# Patient Record
Sex: Female | Born: 1945
Health system: Southern US, Community
[De-identification: ages and names within clinical notes are randomized; demographics above are authoritative.]

## PROBLEM LIST (undated history)

## (undated) DIAGNOSIS — L409 Psoriasis, unspecified: Secondary | ICD-10-CM

## (undated) DIAGNOSIS — M8580 Other specified disorders of bone density and structure, unspecified site: Secondary | ICD-10-CM

## (undated) DIAGNOSIS — E041 Nontoxic single thyroid nodule: Secondary | ICD-10-CM

## (undated) DIAGNOSIS — I10 Essential (primary) hypertension: Secondary | ICD-10-CM

## (undated) DIAGNOSIS — B029 Zoster without complications: Secondary | ICD-10-CM

## (undated) DIAGNOSIS — D3617 Benign neoplasm of peripheral nerves and autonomic nervous system of trunk, unspecified: Secondary | ICD-10-CM

## (undated) DIAGNOSIS — H269 Unspecified cataract: Secondary | ICD-10-CM

## (undated) DIAGNOSIS — M810 Age-related osteoporosis without current pathological fracture: Secondary | ICD-10-CM

## (undated) DIAGNOSIS — F419 Anxiety disorder, unspecified: Secondary | ICD-10-CM

## (undated) DIAGNOSIS — R413 Other amnesia: Secondary | ICD-10-CM

## (undated) DIAGNOSIS — C801 Malignant (primary) neoplasm, unspecified: Secondary | ICD-10-CM

## (undated) HISTORY — PX: ABDOMINAL HYSTERECTOMY: SHX81

## (undated) HISTORY — DX: Essential (primary) hypertension: I10

## (undated) HISTORY — DX: Psoriasis, unspecified: L40.9

## (undated) HISTORY — PX: OOPHORECTOMY: SHX86

## (undated) HISTORY — DX: Benign neoplasm of peripheral nerves and autonomic nervous system of trunk, unspecified: D36.17

## (undated) HISTORY — DX: Zoster without complications: B02.9

## (undated) HISTORY — DX: Age-related osteoporosis without current pathological fracture: M81.0

## (undated) HISTORY — DX: Nontoxic single thyroid nodule: E04.1

## (undated) HISTORY — DX: Unspecified cataract: H26.9

## (undated) HISTORY — PX: TONSILLECTOMY: SHX5217

## (undated) HISTORY — DX: Anxiety disorder, unspecified: F41.9

## (undated) HISTORY — DX: Malignant (primary) neoplasm, unspecified: C80.1

## (undated) HISTORY — PX: COLONOSCOPY: SHX174

## (undated) HISTORY — DX: Other amnesia: R41.3

## (undated) HISTORY — PX: OTHER SURGICAL HISTORY: SHX169

## (undated) HISTORY — PX: APPENDECTOMY: SHX54

## (undated) HISTORY — DX: Other specified disorders of bone density and structure, unspecified site: M85.80

---

## 1999-12-06 ENCOUNTER — Other Ambulatory Visit: Admission: RE | Admit: 1999-12-06 | Discharge: 1999-12-06 | Payer: Self-pay | Admitting: *Deleted

## 2000-04-17 ENCOUNTER — Ambulatory Visit (HOSPITAL_COMMUNITY): Admission: RE | Admit: 2000-04-17 | Discharge: 2000-04-17 | Payer: Self-pay | Admitting: *Deleted

## 2003-06-03 ENCOUNTER — Encounter: Admission: RE | Admit: 2003-06-03 | Discharge: 2003-06-03 | Payer: Self-pay | Admitting: Internal Medicine

## 2004-05-23 ENCOUNTER — Ambulatory Visit: Payer: Self-pay | Admitting: Internal Medicine

## 2004-06-06 ENCOUNTER — Ambulatory Visit: Payer: Self-pay | Admitting: Internal Medicine

## 2005-04-18 ENCOUNTER — Ambulatory Visit: Payer: Self-pay | Admitting: Internal Medicine

## 2005-04-30 ENCOUNTER — Encounter: Admission: RE | Admit: 2005-04-30 | Discharge: 2005-04-30 | Payer: Self-pay | Admitting: Internal Medicine

## 2005-12-17 ENCOUNTER — Ambulatory Visit: Payer: Self-pay | Admitting: Internal Medicine

## 2006-02-11 DIAGNOSIS — B029 Zoster without complications: Secondary | ICD-10-CM

## 2006-02-11 HISTORY — DX: Zoster without complications: B02.9

## 2006-02-14 ENCOUNTER — Ambulatory Visit: Payer: Self-pay | Admitting: Internal Medicine

## 2006-03-19 ENCOUNTER — Ambulatory Visit: Payer: Self-pay | Admitting: Internal Medicine

## 2006-03-19 LAB — CONVERTED CEMR LAB
ALT: 14 units/L (ref 0–40)
AST: 22 units/L (ref 0–37)
BUN: 17 mg/dL (ref 6–23)
Basophils Absolute: 0 10*3/uL (ref 0.0–0.1)
Basophils Relative: 0.9 % (ref 0.0–1.0)
Creatinine, Ser: 1.1 mg/dL (ref 0.4–1.2)
Eosinophils Absolute: 0.1 10*3/uL (ref 0.0–0.6)
Eosinophils Relative: 1.8 % (ref 0.0–5.0)
Free T4: 0.6 ng/dL (ref 0.6–1.6)
HCT: 36.5 % (ref 36.0–46.0)
Hemoglobin: 12.6 g/dL (ref 12.0–15.0)
Lymphocytes Relative: 35.5 % (ref 12.0–46.0)
MCHC: 34.6 g/dL (ref 30.0–36.0)
MCV: 95.4 fL (ref 78.0–100.0)
Monocytes Absolute: 0.3 10*3/uL (ref 0.2–0.7)
Monocytes Relative: 8.4 % (ref 3.0–11.0)
Neutro Abs: 1.9 10*3/uL (ref 1.4–7.7)
Neutrophils Relative %: 53.4 % (ref 43.0–77.0)
Platelets: 233 10*3/uL (ref 150–400)
Potassium: 3.7 meq/L (ref 3.5–5.1)
RBC: 3.82 M/uL — ABNORMAL LOW (ref 3.87–5.11)
RDW: 11.6 % (ref 11.5–14.6)
TSH: 6.39 microintl units/mL — ABNORMAL HIGH (ref 0.35–5.50)
WBC: 3.6 10*3/uL — ABNORMAL LOW (ref 4.5–10.5)

## 2006-04-21 ENCOUNTER — Ambulatory Visit: Payer: Self-pay | Admitting: Internal Medicine

## 2006-04-21 LAB — CONVERTED CEMR LAB
ALT: 24 units/L (ref 0–40)
AST: 27 units/L (ref 0–37)
BUN: 15 mg/dL (ref 6–23)
Basophils Absolute: 0 10*3/uL (ref 0.0–0.1)
Basophils Relative: 0.2 % (ref 0.0–1.0)
Creatinine, Ser: 0.9 mg/dL (ref 0.4–1.2)
Eosinophils Absolute: 0 10*3/uL (ref 0.0–0.6)
Eosinophils Relative: 0.7 % (ref 0.0–5.0)
Free T4: 0.7 ng/dL (ref 0.6–1.6)
HCT: 36.8 % (ref 36.0–46.0)
Hemoglobin: 12.7 g/dL (ref 12.0–15.0)
Lymphocytes Relative: 27.3 % (ref 12.0–46.0)
MCHC: 34.6 g/dL (ref 30.0–36.0)
MCV: 96.1 fL (ref 78.0–100.0)
Monocytes Absolute: 0.3 10*3/uL (ref 0.2–0.7)
Monocytes Relative: 7.1 % (ref 3.0–11.0)
Neutro Abs: 3.1 10*3/uL (ref 1.4–7.7)
Neutrophils Relative %: 64.7 % (ref 43.0–77.0)
Platelets: 215 10*3/uL (ref 150–400)
Potassium: 4.1 meq/L (ref 3.5–5.1)
RBC: 3.83 M/uL — ABNORMAL LOW (ref 3.87–5.11)
RDW: 11.6 % (ref 11.5–14.6)
TSH: 3.57 microintl units/mL (ref 0.35–5.50)
WBC: 4.7 10*3/uL (ref 4.5–10.5)

## 2006-04-28 ENCOUNTER — Ambulatory Visit: Payer: Self-pay | Admitting: Internal Medicine

## 2006-05-06 ENCOUNTER — Ambulatory Visit: Payer: Self-pay | Admitting: Internal Medicine

## 2006-07-21 ENCOUNTER — Encounter: Payer: Self-pay | Admitting: Internal Medicine

## 2006-07-25 ENCOUNTER — Encounter: Payer: Self-pay | Admitting: Internal Medicine

## 2006-10-27 ENCOUNTER — Ambulatory Visit: Payer: Self-pay | Admitting: Internal Medicine

## 2006-10-28 ENCOUNTER — Encounter (INDEPENDENT_AMBULATORY_CARE_PROVIDER_SITE_OTHER): Payer: Self-pay | Admitting: *Deleted

## 2006-10-28 LAB — CONVERTED CEMR LAB: TSH: 7.91 microintl units/mL — ABNORMAL HIGH (ref 0.35–5.50)

## 2006-11-04 ENCOUNTER — Ambulatory Visit: Payer: Self-pay | Admitting: Internal Medicine

## 2006-12-29 ENCOUNTER — Ambulatory Visit: Payer: Self-pay | Admitting: Internal Medicine

## 2007-01-03 LAB — CONVERTED CEMR LAB
CO2: 30 meq/L (ref 19–32)
Chloride: 102 meq/L (ref 96–112)
Cholesterol: 214 mg/dL (ref 0–200)
Creatinine, Ser: 1 mg/dL (ref 0.4–1.2)
Glucose, Bld: 100 mg/dL — ABNORMAL HIGH (ref 70–99)
Sodium: 140 meq/L (ref 135–145)

## 2007-01-05 ENCOUNTER — Encounter (INDEPENDENT_AMBULATORY_CARE_PROVIDER_SITE_OTHER): Payer: Self-pay | Admitting: *Deleted

## 2007-01-13 ENCOUNTER — Ambulatory Visit: Payer: Self-pay | Admitting: Internal Medicine

## 2007-01-13 DIAGNOSIS — Z9089 Acquired absence of other organs: Secondary | ICD-10-CM

## 2007-01-13 DIAGNOSIS — E782 Mixed hyperlipidemia: Secondary | ICD-10-CM

## 2007-01-13 DIAGNOSIS — L408 Other psoriasis: Secondary | ICD-10-CM | POA: Insufficient documentation

## 2007-01-13 DIAGNOSIS — E039 Hypothyroidism, unspecified: Secondary | ICD-10-CM | POA: Insufficient documentation

## 2007-01-13 DIAGNOSIS — E041 Nontoxic single thyroid nodule: Secondary | ICD-10-CM | POA: Insufficient documentation

## 2007-01-13 LAB — CONVERTED CEMR LAB
HDL goal, serum: 40 mg/dL
LDL Goal: 160 mg/dL

## 2007-02-03 ENCOUNTER — Ambulatory Visit: Payer: Self-pay | Admitting: Internal Medicine

## 2007-05-18 ENCOUNTER — Encounter: Admission: RE | Admit: 2007-05-18 | Discharge: 2007-05-18 | Payer: Self-pay | Admitting: Internal Medicine

## 2007-05-25 ENCOUNTER — Encounter (INDEPENDENT_AMBULATORY_CARE_PROVIDER_SITE_OTHER): Payer: Self-pay | Admitting: *Deleted

## 2007-05-25 ENCOUNTER — Telehealth (INDEPENDENT_AMBULATORY_CARE_PROVIDER_SITE_OTHER): Payer: Self-pay | Admitting: *Deleted

## 2007-06-04 ENCOUNTER — Telehealth (INDEPENDENT_AMBULATORY_CARE_PROVIDER_SITE_OTHER): Payer: Self-pay | Admitting: *Deleted

## 2007-06-04 ENCOUNTER — Ambulatory Visit: Payer: Self-pay | Admitting: Family Medicine

## 2007-06-05 ENCOUNTER — Encounter (INDEPENDENT_AMBULATORY_CARE_PROVIDER_SITE_OTHER): Payer: Self-pay | Admitting: *Deleted

## 2007-06-05 LAB — CONVERTED CEMR LAB
Basophils Relative: 0.7 % (ref 0.0–1.0)
Hemoglobin: 14 g/dL (ref 12.0–15.0)
Lymphocytes Relative: 16.4 % (ref 12.0–46.0)
Monocytes Relative: 5.5 % (ref 3.0–12.0)
Neutro Abs: 6.6 10*3/uL (ref 1.4–7.7)
RBC: 4.34 M/uL (ref 3.87–5.11)
RDW: 10.9 % — ABNORMAL LOW (ref 11.5–14.6)

## 2007-06-06 ENCOUNTER — Ambulatory Visit: Payer: Self-pay | Admitting: Family Medicine

## 2007-06-26 ENCOUNTER — Telehealth (INDEPENDENT_AMBULATORY_CARE_PROVIDER_SITE_OTHER): Payer: Self-pay | Admitting: *Deleted

## 2007-07-29 ENCOUNTER — Encounter: Payer: Self-pay | Admitting: Internal Medicine

## 2007-10-29 ENCOUNTER — Telehealth (INDEPENDENT_AMBULATORY_CARE_PROVIDER_SITE_OTHER): Payer: Self-pay | Admitting: *Deleted

## 2007-10-30 ENCOUNTER — Telehealth (INDEPENDENT_AMBULATORY_CARE_PROVIDER_SITE_OTHER): Payer: Self-pay | Admitting: *Deleted

## 2007-12-14 ENCOUNTER — Telehealth (INDEPENDENT_AMBULATORY_CARE_PROVIDER_SITE_OTHER): Payer: Self-pay | Admitting: *Deleted

## 2008-03-01 ENCOUNTER — Ambulatory Visit: Payer: Self-pay | Admitting: Internal Medicine

## 2008-03-01 DIAGNOSIS — M810 Age-related osteoporosis without current pathological fracture: Secondary | ICD-10-CM | POA: Insufficient documentation

## 2008-03-01 DIAGNOSIS — M8580 Other specified disorders of bone density and structure, unspecified site: Secondary | ICD-10-CM

## 2008-03-01 DIAGNOSIS — I10 Essential (primary) hypertension: Secondary | ICD-10-CM

## 2008-03-07 ENCOUNTER — Encounter: Payer: Self-pay | Admitting: Internal Medicine

## 2008-03-07 ENCOUNTER — Telehealth: Payer: Self-pay | Admitting: Internal Medicine

## 2008-03-16 ENCOUNTER — Telehealth (INDEPENDENT_AMBULATORY_CARE_PROVIDER_SITE_OTHER): Payer: Self-pay | Admitting: *Deleted

## 2008-03-17 ENCOUNTER — Ambulatory Visit: Payer: Self-pay | Admitting: Internal Medicine

## 2008-03-20 LAB — CONVERTED CEMR LAB
AST: 23 units/L (ref 0–37)
Alkaline Phosphatase: 47 units/L (ref 39–117)
Basophils Absolute: 0 10*3/uL (ref 0.0–0.1)
Bilirubin, Direct: 0.1 mg/dL (ref 0.0–0.3)
Chloride: 110 meq/L (ref 96–112)
Eosinophils Absolute: 0 10*3/uL (ref 0.0–0.7)
Eosinophils Relative: 1.2 % (ref 0.0–5.0)
GFR calc non Af Amer: 60 mL/min
HDL: 98.6 mg/dL (ref >39.0)
MCV: 101.1 fL — ABNORMAL HIGH (ref 78.0–100.0)
Neutrophils Relative %: 49.8 % (ref 43.0–77.0)
Platelets: 197 10*3/uL (ref 150–400)
Potassium: 4.4 meq/L (ref 3.5–5.1)
RDW: 12.7 % (ref 11.5–14.6)
Sodium: 141 meq/L (ref 135–145)
Total Bilirubin: 0.5 mg/dL (ref 0.3–1.2)
VLDL: 17 mg/dL (ref 0–40)
WBC: 3.2 10*3/uL — ABNORMAL LOW (ref 4.5–10.5)

## 2008-03-21 ENCOUNTER — Encounter (INDEPENDENT_AMBULATORY_CARE_PROVIDER_SITE_OTHER): Payer: Self-pay | Admitting: *Deleted

## 2008-03-28 ENCOUNTER — Encounter (INDEPENDENT_AMBULATORY_CARE_PROVIDER_SITE_OTHER): Payer: Self-pay | Admitting: *Deleted

## 2008-07-10 ENCOUNTER — Telehealth: Payer: Self-pay | Admitting: Family Medicine

## 2008-07-18 ENCOUNTER — Ambulatory Visit: Payer: Self-pay | Admitting: Internal Medicine

## 2008-07-19 ENCOUNTER — Encounter (INDEPENDENT_AMBULATORY_CARE_PROVIDER_SITE_OTHER): Payer: Self-pay | Admitting: *Deleted

## 2008-07-19 LAB — CONVERTED CEMR LAB
Basophils Relative: 1.2 % (ref 0.0–3.0)
Eosinophils Absolute: 0.1 10*3/uL (ref 0.0–0.7)
Eosinophils Relative: 1.1 % (ref 0.0–5.0)
Hemoglobin: 12.5 g/dL (ref 12.0–15.0)
MCHC: 34.3 g/dL (ref 30.0–36.0)
MCV: 99.6 fL (ref 78.0–100.0)
Monocytes Absolute: 0.4 10*3/uL (ref 0.1–1.0)
Neutro Abs: 3.1 10*3/uL (ref 1.4–7.7)
RBC: 3.66 M/uL — ABNORMAL LOW (ref 3.87–5.11)
WBC: 5.2 10*3/uL (ref 4.5–10.5)

## 2008-09-01 ENCOUNTER — Encounter: Payer: Self-pay | Admitting: Internal Medicine

## 2008-12-08 ENCOUNTER — Encounter: Payer: Self-pay | Admitting: Internal Medicine

## 2008-12-12 ENCOUNTER — Ambulatory Visit: Payer: Self-pay | Admitting: Internal Medicine

## 2008-12-12 ENCOUNTER — Encounter (INDEPENDENT_AMBULATORY_CARE_PROVIDER_SITE_OTHER): Payer: Self-pay | Admitting: *Deleted

## 2008-12-12 LAB — CONVERTED CEMR LAB: OCCULT 3: NEGATIVE

## 2009-02-11 DIAGNOSIS — C801 Malignant (primary) neoplasm, unspecified: Secondary | ICD-10-CM

## 2009-02-11 HISTORY — DX: Malignant (primary) neoplasm, unspecified: C80.1

## 2009-02-22 ENCOUNTER — Telehealth (INDEPENDENT_AMBULATORY_CARE_PROVIDER_SITE_OTHER): Payer: Self-pay | Admitting: *Deleted

## 2009-04-05 ENCOUNTER — Ambulatory Visit: Payer: Self-pay | Admitting: Internal Medicine

## 2009-04-05 DIAGNOSIS — R198 Other specified symptoms and signs involving the digestive system and abdomen: Secondary | ICD-10-CM

## 2009-04-06 LAB — CONVERTED CEMR LAB
ALT: 16 units/L (ref 0–35)
BUN: 16 mg/dL (ref 6–23)
Basophils Relative: 0.6 % (ref 0.0–3.0)
CO2: 30 meq/L (ref 19–32)
Chloride: 106 meq/L (ref 96–112)
Creatinine, Ser: 1 mg/dL (ref 0.4–1.2)
Eosinophils Absolute: 0 10*3/uL (ref 0.0–0.7)
Eosinophils Relative: 0.9 % (ref 0.0–5.0)
HCT: 38.8 % (ref 36.0–46.0)
Lymphs Abs: 0.9 10*3/uL (ref 0.7–4.0)
MCHC: 33.8 g/dL (ref 30.0–36.0)
MCV: 101.6 fL — ABNORMAL HIGH (ref 78.0–100.0)
Monocytes Absolute: 0.4 10*3/uL (ref 0.1–1.0)
Neutrophils Relative %: 68.2 % (ref 43.0–77.0)
Potassium: 4 meq/L (ref 3.5–5.1)
RBC: 3.82 M/uL — ABNORMAL LOW (ref 3.87–5.11)
WBC: 4.3 10*3/uL — ABNORMAL LOW (ref 4.5–10.5)

## 2009-12-13 ENCOUNTER — Encounter: Payer: Self-pay | Admitting: Internal Medicine

## 2009-12-28 ENCOUNTER — Ambulatory Visit: Payer: Self-pay | Admitting: Internal Medicine

## 2009-12-28 DIAGNOSIS — R5383 Other fatigue: Secondary | ICD-10-CM

## 2009-12-28 DIAGNOSIS — R5381 Other malaise: Secondary | ICD-10-CM

## 2010-01-01 LAB — CONVERTED CEMR LAB
ALT: 20 units/L (ref 0–35)
Eosinophils Relative: 1.8 % (ref 0.0–5.0)
HCT: 39.1 % (ref 36.0–46.0)
Hemoglobin: 13.4 g/dL (ref 12.0–15.0)
MCHC: 34.3 g/dL (ref 30.0–36.0)
MCV: 97 fL (ref 78.0–100.0)
RBC: 4.03 M/uL (ref 3.87–5.11)
RDW: 11.6 % (ref 11.5–14.6)
TSH: 0.55 microintl units/mL (ref 0.35–5.50)

## 2010-03-11 LAB — CONVERTED CEMR LAB: LDL Goal: 130 mg/dL

## 2010-03-13 NOTE — Assessment & Plan Note (Signed)
Summary: discuss bone density & lab done at gyn office,cbs   Vital Signs:  Patient Profile:   65 Years Old Female Weight:      133.25 pounds Pulse rate:   60 / minute Pulse rhythm:   regular BP sitting:   128 / 70  (left arm) Cuff size:   large  Vitals Entered By: Wendall Stade (November 04, 2006 11:10 AM)                 Chief Complaint:  follow up bmd and labs.  History of Present Illness: TSH 3.57 on 04/21/06; on 06/11/06 @ GYN TSH was 2.578. On in 9/08 TSH was 7.91 on levothyroxine 50 micrograms 1/2 once daily .No change in manner taken & no change in brand.  Current Allergies (reviewed today): No known allergies  Updated/Current Medications (including changes made in today's visit):  TOPROL XL 50 MG  TB24 (METOPROLOL SUCCINATE) take 1/2 tab once daily FOLIC ACID 1 MG  TABS (FOLIC ACID) TAKE ONE TABLET BY MOUTH ONE  TIME DAILY PAXIL 20 MG  TABS (PAROXETINE HCL) 1/2 tab qod HYDROCHLOROTHIAZIDE 25 MG  TABS (HYDROCHLOROTHIAZIDE) 1/2 by mouth qd LEVOTHYROXINE SODIUM 50 MCG  TABS (LEVOTHYROXINE SODIUM) 1 tab qd * CALCIUM AND VIT D       Review of Systems  General      Complains of fatigue.      10 # weight gain  GI      Denies constipation.  MS       07/21/06 BMD reviewed ; T score -1.3 @ spine; not on Ca++ regularly; improved @ hip   Derm      Complains of changes in nail beds and dryness.      Denies hair loss.      nails brittle  Endo      Denies cold intolerance, excessive hunger, excessive thirst, excessive urination, heat intolerance, polyuria, and weight change.   Physical Exam  General:     Well-developed,well-nourished,in no acute distress; alert,appropriate and cooperative throughout examination Neck:     No deformities, masses, or tenderness noted.thyroid small & firm Heart:     Normal rate and regular rhythm. S1 and S2 normal without gallop, murmur, click, rub or other extra sounds. Neurologic:     DTRs symmetrical and normal.       Impression & Recommendations:  Problem # 1:  HYPOTHYROIDISM (ICD-244.9)  Her updated medication list for this problem includes:    Levothyroxine Sodium 50 Mcg Tabs (Levothyroxine sodium) .Marland Kitchen... 1 tab qd   Complete Medication List: 1)  Toprol Xl 50 Mg Tb24 (Metoprolol succinate) .... Take 1/2 tab once daily 2)  Folic Acid 1 Mg Tabs (Folic acid) .... Take one tablet by mouth one  time daily 3)  Paxil 20 Mg Tabs (Paroxetine hcl) .... 1/2 tab qod 4)  Hydrochlorothiazide 25 Mg Tabs (Hydrochlorothiazide) .... 1/2 by mouth qd 5)  Levothyroxine Sodium 50 Mcg Tabs (Levothyroxine sodium) .Marland Kitchen.. 1 tab qd 6)  Calcium and Vit D    Patient Instructions: 1)  Please schedule a follow-up appointment in 9 weeks. 2)  TSH prior to visit, ICD-9:244.9 3)  BMP prior to visit, ICD-9:401.9    Prescriptions: LEVOTHYROXINE SODIUM 50 MCG  TABS (LEVOTHYROXINE SODIUM) 1 tab qd  #90 x 3   Entered and Authorized by:   Marga Melnick MD   Signed by:   Marga Melnick MD on 11/04/2006   Method used:   Print then Give to Patient   RxID:  [id number] HYDROCHLOROTHIAZIDE 25 MG  TABS (HYDROCHLOROTHIAZIDE) 1/2 by mouth qd  #100 x 1   Entered and Authorized by:   Marga Melnick MD   Signed by:   Marga Melnick MD on 11/04/2006   Method used:   Print then Give to Patient   RxID:   [id number] FOLIC ACID 1 MG  TABS (FOLIC ACID) TAKE ONE TABLET BY MOUTH ONE  TIME DAILY  #31000 x 3   Entered and Authorized by:   Marga Melnick MD   Signed by:   Marga Melnick MD on 11/04/2006   Method used:   Print then Give to Patient   RxID:   [id number] TOPROL XL 50 MG  TB24 (METOPROLOL SUCCINATE) take 1/2 tab once daily  #90 x 1   Entered and Authorized by:   Marga Melnick MD   Signed by:   Marga Melnick MD on 11/04/2006   Method used:   Print then Give to Patient   RxID:   [id number]  ]

## 2010-03-13 NOTE — Progress Notes (Signed)
Summary: refill  Phone Note Refill Request Message from:  Fax from Pharmacy on [facility] fax [phone]  Refills Requested: Medication #1:  FOLIC ACID 1 MG  TABS TAKE ONE TABLET BY MOUTH ONE  TIME DAILY  Medication #2:  HYDROCHLOROTHIAZIDE 25 MG  TABS 1/2 by mouth qd Initial call taken by: Barb Merino,  February 22, 2009 2:47 PM    Prescriptions: HYDROCHLOROTHIAZIDE 25 MG  TABS (HYDROCHLOROTHIAZIDE) 1/2 by mouth qd  #45 x 0   Entered by:   Kandice Hams   Authorized by:   Marga Melnick MD   Signed by:   Kandice Hams on 02/22/2009   Method used:   Faxed to ...       [facility] (mail-order)             , [place]         Ph: [phone]       Fax: [phone]   RxID:   [id number] FOLIC ACID 1 MG  TABS (FOLIC ACID) TAKE ONE TABLET BY MOUTH ONE  TIME DAILY  #90 x 0   Entered by:   Kandice Hams   Authorized by:   Marga Melnick MD   Signed by:   Kandice Hams on 02/22/2009   Method used:   Faxed to ...       [facility] (mail-order)             , [place]         Ph: [phone]       Fax: [phone]   RxID:   [id number]

## 2010-03-13 NOTE — Assessment & Plan Note (Signed)
Summary: roa//pt will be fasting//lch   Vital Signs:  Patient profile:   65 year old female Weight:      118.6 pounds Pulse rate:   60 / minute Resp:     14 per minute BP sitting:   108 / 76  (left arm) Cuff size:   regular  Vitals Entered By: Shonna Chock (April 05, 2009 8:11 AM) CC: 1.) Bowel changes  2.) Renew meds  3.)Fasting if labs needed, Hypertension Management Comments REVIEWED MED LIST, PATIENT AGREED DOSE AND INSTRUCTION CORRECT    Primary Care Provider:  Laury Axon  CC:  1.) Bowel changes  2.) Renew meds  3.)Fasting if labs needed and Hypertension Management.  History of Present Illness: Renee Santos is on low dose MTX Rxed by Dr Londell Moh for Psoriais; LFTs checked every 3 months, last this month. She has had bowel changes since Fall 2010; previously she had chronic constipation. Now she has up to 2 BMs / day , loose w/o diarrhea with occasional urgency w/o incontinence. Stool dark, but FOB negative late 2010.  She was in [place] 10/2008 for 12 days;bowel changes started pre trip. She was not sick in [place]. Orel  has had  no antibiotics in past 6 months.No FH or PMH of Gi disease.  Hypertension History:      She complains of neurologic problems, but denies headache, chest pain, palpitations, dyspnea with exertion, orthopnea, PND, peripheral edema, visual symptoms, syncope, and side effects from treatment.  She notes no problems with any antihypertensive medication side effects.  [place] N&T in  toes, ? from cold exposure.  Further comments include: BP not checked.        Positive major cardiovascular risk factors include female age 20 years old or older, hyperlipidemia, hypertension, and family history for ischemic heart disease (females less than 25 years old).  Negative major cardiovascular risk factors include no history of diabetes and non-tobacco-user status.        Further assessment for target organ damage reveals no history of ASHD, stroke/TIA, or peripheral vascular disease.      Allergies (verified): No Known Drug Allergies  Past History:  Past Surgical History: G 2 P2 Colon polypectomy 2005 Hysterectomy & BSO  age 36 for ovarian cyst Tonsillectomy  Family History: Father HTN Mother:CAD,bypass  in 6s , hyperlipidemia, hyperthyroid, Macular Degeneration Siblings: none; MGM CAD; no FH GI   Social History: Married Never Smoked Alcohol use-yes: socially Regular exercise-yes: walking 3.5 mpd 6X/week Low fat diet  Review of Systems General:  Denies chills, fatigue, fever, sweats, and weight loss. Eyes:  Denies blurring, double vision, and vision loss-both eyes. CV:  Denies leg cramps with exertion, lightheadness, and near fainting. GI:  See HPI. MS:  Denies muscle aches and muscle weakness. Derm:  Complains of dryness; denies changes in nail beds and hair loss. Neuro:  Complains of numbness and tingling. Endo:  Denies cold intolerance, excessive hunger, excessive thirst, excessive urination, and heat intolerance. Allergy:  Complains of seasonal allergies.  Physical Exam  General:  well-nourished,in no acute distress; alert,appropriate and cooperative throughout examination Eyes:  No corneal or conjunctival inflammation noted.Perrla. No lid lag Neck:  No deformities, masses, or tenderness noted. Asymmetry of thyroid ; R smooth & firm w/o nodule , L small Lungs:  Normal respiratory effort, chest expands symmetrically. Lungs are clear to auscultation, no crackles or wheezes. Heart:  Normal rate and regular rhythm. S1 and S2 normal without gallop, murmur, click, rub . S4 Abdomen:  Bowel sounds positive,abdomen soft  and non-tender without masses, organomegaly or hernias noted. Pulses:  R and L carotid,radial,dorsalis pedis and posterior tibial pulses are full and equal bilaterally Extremities:  No clubbing, cyanosis, edema. Nails normal Neurologic:  alert & oriented X3 and DTRs symmetrical and normal.  No tremor Skin:  Intact without suspicious  lesions or rashes Cervical Nodes:  No lymphadenopathy noted Axillary Nodes:  No palpable lymphadenopathy Psych:  memory intact for recent and remote, normally interactive, and good eye contact.     Impression & Recommendations:  Problem # 1:  CHANGE IN BOWELS (JYN-829.56)  ? IBS variant, doubt parasitism  Orders: Venipuncture (21308) TLB-CBC Platelet - w/Differential (85025-CBCD) TLB-TSH (Thyroid Stimulating Hormone) (84443-TSH) TLB-ALT (SGPT) (84460-ALT) TLB-AST (SGOT) (84450-SGOT)  Problem # 2:  HYPERTENSION (ICD-401.9)  controlled Her updated medication list for this problem includes:    Toprol Xl 50 Mg Tb24 (Metoprolol succinate) .Marland Kitchen... Take 1/2 tab once daily    Hydrochlorothiazide 25 Mg Tabs (Hydrochlorothiazide) .Marland Kitchen... 1/2 by mouth qd  Orders: Venipuncture (65784) TLB-BMP (Basic Metabolic Panel-BMET) (80048-METABOL)  Problem # 3:  HYPOTHYROIDISM (ICD-244.9)  Her updated medication list for this problem includes:    Levothyroxine Sodium 50 Mcg Tabs (Levothyroxine sodium) .Marland Kitchen... 1 tab qd  Orders: Venipuncture (69629) TLB-TSH (Thyroid Stimulating Hormone) (84443-TSH)  Problem # 4:  HYPERLIPIDEMIA (ICD-272.2) due to elevated HDL  Problem # 5:  PSORIASIS (ICD-696.1) as per Dr Londell Moh  Complete Medication List: 1)  Toprol Xl 50 Mg Tb24 (Metoprolol succinate) .... Take 1/2 tab once daily 2)  Folic Acid 1 Mg Tabs (Folic acid) .... Take one tablet by mouth one  time daily 3)  Hydrochlorothiazide 25 Mg Tabs (Hydrochlorothiazide) .... 1/2 by mouth qd 4)  Levothyroxine Sodium 50 Mcg Tabs (Levothyroxine sodium) .Marland Kitchen.. 1 tab qd 5)  Adult Aspirin Low Strength 81 Mg Tbdp (Aspirin) .Marland Kitchen.. 1 by mouth once daily 6)  Methotrexate 2.5 Mg Tabs (Methotrexate sodium) .... 6 pills weekly 7)  Premarin 0.45 Mg Tabs (Estrogens conjugated) .Marland Kitchen.. 1 by mouth once daily  Hypertension Assessment/Plan:      The patient's hypertensive risk group is category B: At least one risk factor (excluding  diabetes) with no target organ damage.  Her calculated 10 year risk of coronary heart disease is 3 %.  Today's blood pressure is 108/76.    Patient Instructions: 1)  Obtain recent lab results from Dr Londell Moh each visit. Take Align once daily until bowels are normal. Report frank diarrhea. Prescriptions: LEVOTHYROXINE SODIUM 50 MCG  TABS (LEVOTHYROXINE SODIUM) 1 tab qd  #90 x 3   Entered and Authorized by:   Marga Melnick MD   Signed by:   Marga Melnick MD on 04/05/2009   Method used:   Print then Give to Patient   RxID:   [id number] HYDROCHLOROTHIAZIDE 25 MG  TABS (HYDROCHLOROTHIAZIDE) 1/2 by mouth qd  #45 x 3   Entered and Authorized by:   Marga Melnick MD   Signed by:   Marga Melnick MD on 04/05/2009   Method used:   Print then Give to Patient   RxID:   [id number] TOPROL XL 50 MG  TB24 (METOPROLOL SUCCINATE) take 1/2 tab once daily  #45 x 3   Entered and Authorized by:   Marga Melnick MD   Signed by:   Marga Melnick MD on 04/05/2009   Method used:   Print then Give to Patient   RxID:   [id number]

## 2010-03-13 NOTE — Assessment & Plan Note (Signed)
Summary: cough and cold--went to [facility]--has antibiotic--not better///sph   Vital Signs:  Patient profile:   65 year old female Weight:      114.8 pounds BMI:     21.24 Temp:     97.4 degrees F oral Pulse rate:   76 / minute Resp:     15 per minute BP sitting:   102 / 68  (left arm) Cuff size:   regular  Vitals Entered By: Shonna Chock CMA (December 28, 2009 1:40 PM) CC: Cough and cold, seen at urgent care 12/22/09 and RX'ed Amox-Calv (currently on) , URI symptoms   Primary Care Provider:  Laury Axon  CC:  Cough and cold, seen at urgent care 12/22/09 and RX'ed Amox-Calv (currently on) , and URI symptoms.  History of Present Illness: RTI  Symptoms      This is a 65 year old woman who presents with RTI symptoms. Onset was 11/8 as head congestion. On 11/8 Augmentin  & cough syrup were  Rx[place] for productive  [place]  The patient now  reports productive cough with clear secretions, but denies nasal congestion, purulent nasal discharge, sore throat, and earache.  The patient denies fever, dyspnea, and wheezing.  The patient also reports headache , severe fatigue &  bilateral  tooth pain.  The patient denies the following risk factors for Strep sinusitis: bilateral facial pain and tender adenopathy.  She has just restarted MTX for her Psorisis this week. CBC & dif has not been checked since this acute illness began.  Current Medications (verified): 1)  Toprol Xl 50 Mg  Tb24 (Metoprolol Succinate) .... Take 1/2 Tab Once Daily 2)  Folic Acid 1 Mg  Tabs (Folic Acid) .... Take One Tablet By Mouth One  Time Daily 3)  Hydrochlorothiazide 25 Mg  Tabs (Hydrochlorothiazide) .... 1/2 By Mouth Qd 4)  Levothyroxine Sodium 50 Mcg  Tabs (Levothyroxine Sodium) .Marland Kitchen.. 1 Tab Qd 5)  Adult Aspirin Low Strength 81 Mg Tbdp (Aspirin) .Marland Kitchen.. 1 By Mouth Once Daily 6)  Methotrexate 2.5 Mg Tabs (Methotrexate Sodium) .... 3 Pills Weekly 7)  Premarin 0.45 Mg Tabs (Estrogens Conjugated)  .Marland Kitchen.. 1 By Mouth Once Daily 8)  Multivitamins  Tabs (Multiple Vitamin) .Marland Kitchen.. 1 By Mouth Once Daily 9)  Amoxicillin-Pot Clavulanate 875-125 Mg Tabs (Amoxicillin-Pot Clavulanate) .Marland Kitchen.. 1 By Mouth Two Times A Day X 10days, Rx'ed At Urgent Care  Allergies (verified): No Known Drug Allergies  Physical Exam  General:  Tired but in no acute distress; alert,appropriate and cooperative throughout examination Ears:  External ear exam shows no significant lesions or deformities.  Otoscopic examination reveals clear canals, tympanic membranes are intact bilaterally without bulging, retraction, inflammation or discharge. Hearing is grossly normal bilaterally. Nose:  External nasal examination shows no deformity or inflammation. Nasal mucosa are pink and moist without lesions or exudates. Mouth:  Oral mucosa and oropharynx without lesions or exudates.  Teeth in good repair. Lungs:  Normal respiratory effort, chest expands symmetrically. Lungs are clear to auscultation, no crackles or wheezes. Heart:  Normal rate and regular rhythm. S1 and S2 normal without gallop, murmur, click, rub or other extra sounds. Cervical Nodes:  No lymphadenopathy noted Axillary Nodes:  No palpable lymphadenopathy   Impression & Recommendations:  Problem # 1:  BRONCHITIS-ACUTE (ICD-466.0)  ? RAD component; no PMH of asthma. Pathophysiology of post infectios RAD discussed The following medications were removed from the medication list:    Azithromycin 250 Mg Tabs (Azithromycin) .Marland KitchenMarland KitchenMarland KitchenMarland Kitchen  As per pack Her updated medication list for this problem includes:    Amoxicillin-pot Clavulanate 875-125 Mg Tabs (Amoxicillin-pot clavulanate) .Marland Kitchen... 1 by mouth two times a day x 10days, rx'ed at urgent care    Advair Diskus 100-50 Mcg/dose Aepb (Fluticasone-salmeterol) .Marland Kitchen... 1 inhalation every 12 hrs ; gargle & spit after use    Hydromet 5-1.5 Mg/42ml Syrp (Hydrocodone-homatropine) .Marland Kitchen... 1 tsp every 6 hrs as needed for cough  Orders: Venipuncture  (98119) TLB-CBC Platelet - w/Differential (85025-CBCD) T-2 View CXR (71020TC) Specimen Handling (14782)  Problem # 2:  FATIGUE (ICD-780.79)  Note : on MTX since 11/15; no recent CBC& dif monitor  Orders: TLB-TSH (Thyroid Stimulating Hormone) (84443-TSH) TLB-ALT (SGPT) (84460-ALT) TLB-AST (SGOT) (84450-SGOT) Specimen Handling (95621)  Problem # 3:  PSORIASIS (ICD-696.1)  Complete Medication List: 1)  Toprol Xl 50 Mg Tb24 (Metoprolol succinate) .... Take 1/2 tab once daily 2)  Folic Acid 1 Mg Tabs (Folic acid) .... Take one tablet by mouth one  time daily 3)  Hydrochlorothiazide 25 Mg Tabs (Hydrochlorothiazide) .... 1/2 by mouth qd 4)  Levothyroxine Sodium 50 Mcg Tabs (Levothyroxine sodium) .Marland Kitchen.. 1 tab qd 5)  Adult Aspirin Low Strength 81 Mg Tbdp (Aspirin) .Marland Kitchen.. 1 by mouth once daily 6)  Methotrexate 2.5 Mg Tabs (Methotrexate sodium) .... 3 pills weekly 7)  Premarin 0.45 Mg Tabs (Estrogens conjugated) .Marland Kitchen.. 1 by mouth once daily 8)  Multivitamins Tabs (Multiple vitamin) .Marland Kitchen.. 1 by mouth once daily 9)  Amoxicillin-pot Clavulanate 875-125 Mg Tabs (Amoxicillin-pot clavulanate) .Marland Kitchen.. 1 by mouth two times a day x 10days, rx'ed at urgent care 10)  Advair Diskus 100-50 Mcg/dose Aepb (Fluticasone-salmeterol) .Marland Kitchen.. 1 inhalation every 12 hrs ; gargle & spit after use 11)  Hydromet 5-1.5 Mg/4ml Syrp (Hydrocodone-homatropine) .Marland Kitchen.. 1 tsp every 6 hrs as needed for cough  Patient Instructions: 1)  Drink as much fluid as you can tolerate for the next few days.  Hold Azithromax until blood count returns & we evaluate your response to new meds.CXray 11/18 if not improving. Prescriptions: HYDROMET 5-1.5 MG/5ML SYRP (HYDROCODONE-HOMATROPINE) 1 tsp every 6 hrs as needed for cough  #120 cc x 0   Entered and Authorized by:   Marga Melnick MD   Signed by:   Marga Melnick MD on 12/28/2009   Method used:   Print then Give to Patient   RxID:   [id number] AZITHROMYCIN 250 MG TABS (AZITHROMYCIN) as per pack   #1 x 0   Entered and Authorized by:   Marga Melnick MD   Signed by:   Marga Melnick MD on 12/28/2009   Method used:   Print then Give to Patient   RxID:   [id number] HYDROMET 5-1.5 MG/5ML SYRP (HYDROCODONE-HOMATROPINE) 1 tsp every 6 hrs as needed for cough  #120 cc x 0   Entered and Authorized by:   Marga Melnick MD   Signed by:   Marga Melnick MD on 12/28/2009   Method used:   Samples Given   RxID:   [id number] ADVAIR DISKUS 100-50 MCG/DOSE AEPB (FLUTICASONE-SALMETEROL) 1 inhalation every 12 hrs ; gargle & spit after use  #1 x 0   Entered and Authorized by:   Marga Melnick MD   Signed by:   Marga Melnick MD on 12/28/2009   Method used:   Samples Given   RxID:   [id number]    Orders Added: 1)  Est. Patient Level III [30160] 2)  Venipuncture [10932] 3)  TLB-CBC Platelet - w/Differential [85025-CBCD] 4)  TLB-TSH (Thyroid Stimulating Hormone) [35573-UKG]  5)  TLB-ALT (SGPT) [84460-ALT] 6)  TLB-AST (SGOT) [84450-SGOT] 7)  T-2 View CXR [71020TC] 8)  Specimen Handling [99000]

## 2010-05-22 ENCOUNTER — Telehealth: Payer: Self-pay | Admitting: Internal Medicine

## 2010-05-22 MED ORDER — HYDROCHLOROTHIAZIDE 25 MG PO TABS: 25.0000 mg | ORAL_TABLET | ORAL | Status: DC

## 2010-05-22 MED ORDER — LEVOTHYROXINE SODIUM 50 MCG PO TABS: 50.0000 ug | ORAL_TABLET | Freq: Every day | ORAL | Status: DC

## 2010-05-22 MED ORDER — METOPROLOL SUCCINATE ER 50 MG PO TB24: 50.0000 mg | ORAL_TABLET | ORAL | Status: DC

## 2010-05-22 NOTE — Telephone Encounter (Signed)
RX's sent to pharmacy 

## 2010-05-22 NOTE — Telephone Encounter (Signed)
Patient has new mail order pharmacy = [facility]  Patient needs three prescriptions : 1)  Hydrochlorothiazide    2)  Metoprolol    3)  Levothyroxine  Patient says their fax number is [phone]  She says that you will need to give them her ID# = [id number]  She has a CPX with Dr Alwyn Ren scheduled for 6/18 at 1:00

## 2010-05-23 ENCOUNTER — Other Ambulatory Visit: Payer: Self-pay | Admitting: *Deleted

## 2010-05-23 MED ORDER — METOPROLOL SUCCINATE ER 50 MG PO TB24: ORAL_TABLET | ORAL | Status: DC

## 2010-05-23 MED ORDER — HYDROCHLOROTHIAZIDE 25 MG PO TABS: ORAL_TABLET | ORAL | Status: DC

## 2010-06-29 NOTE — Assessment & Plan Note (Signed)
[facility]                                 ON-CALL NOTE   VIERA, OKONSKI                          MRN:          [id number]  DATE:04/27/2006                            DOB:          [birth year]    PRIMARY CARE PHYSICIAN:  Dr. Alwyn Ren   Renee Santos is a patient of Dr. Frederik Pear who reports that she has had  shingles for the last two weeks. She was recently prescribed  hydrocodone/acetaminophen 5/500. She was advised to take one every six  hours. She reports that the pain is pretty unbearable and therefore, she  has been taking two tablets every 4-6 hours. Additionally, she was given  Gabapentin and was wondering if she should continue that since she has  not noticed any improvement.   PLAN:  I advised the patient to continue her prescriptions, including  Gabapentin and to use the hydrocodone as needed. I also recommended that  the patient should follow up with Dr. Alwyn Ren tomorrow for further  recommendations. The patient may also benefit from a Lidoderm patch, but  I will leave that decision up to Dr. Alwyn Ren. The patient expressed  understanding and will call if she has any other concerns.     Leanne Chang, M.D.  Electronically Signed    [facility]/Med[facility]  DD: 04/27/2006  DT: 04/28/2006  Job #: [id number]

## 2010-06-29 NOTE — Assessment & Plan Note (Signed)
[facility]                        [facility] OFFICE NOTE   [name], [name]                          MRN:          [id number]  DATE:04/21/2006                            DOB:          10-Oct-1945    Renee Santos returned from [place] April 20, 2006. While there, she  developed shingles. On March 2, she noted pain in her back attributed to  injury playing golf. On March 3, she noted a rash over the abdomen. She  was seen on March 6 by a Dr. Dayton Scrape  and placed on Valtrex 500 mg 1  twice a day.   She also had Tylenol with codeine but continues to have severe stabbing  type pain which is interfering with sleep.   Additionally, she was exposed to her daughter who is pregnant and  concerned about the risk to the fetus.   Respiratory rate is 15, blood pressure 110/60, pulse 60 and regular.  Weight is up 2 pounds to 133. She is well tanned and in no acute  distress. Temperature is 97.9.  She had no lymphadenopathy or organomegaly.  There was a drying zoster rash in the T11 dermatome.   She was placed on Valtrex 1000 mg 3 times a day. Gabapentin 100 mg 1  every 8 hours will be prescribed which can be titrated up to 300 mg  every 12 hours as needed.   She was reassured based on literature scan that the fetus should be a  low risk.   She also had a TSH drawn, as she was found to be hypothyroid prior to  leaving for [place].     Titus Dubin. Alwyn Ren, MD,FACP,FCCP  Electronically Signed    [facility]/Med[facility]  DD: 04/21/2006  DT: 04/22/2006  Job #: [id number]

## 2010-07-28 ENCOUNTER — Encounter: Payer: Self-pay | Admitting: Internal Medicine

## 2010-07-30 ENCOUNTER — Ambulatory Visit (INDEPENDENT_AMBULATORY_CARE_PROVIDER_SITE_OTHER): Payer: Managed Care, Other (non HMO) | Admitting: Internal Medicine

## 2010-07-30 ENCOUNTER — Telehealth: Payer: Self-pay | Admitting: Internal Medicine

## 2010-07-30 ENCOUNTER — Encounter: Payer: Self-pay | Admitting: Internal Medicine

## 2010-07-30 VITALS — BP 110/60 | HR 64 | Temp 98.5°F | Ht 61.0 in | Wt 112.0 lb

## 2010-07-30 DIAGNOSIS — Z23 Encounter for immunization: Secondary | ICD-10-CM

## 2010-07-30 DIAGNOSIS — E041 Nontoxic single thyroid nodule: Secondary | ICD-10-CM

## 2010-07-30 DIAGNOSIS — E782 Mixed hyperlipidemia: Secondary | ICD-10-CM

## 2010-07-30 DIAGNOSIS — Z0000 Encounter for general adult medical examination without abnormal findings: Secondary | ICD-10-CM

## 2010-07-30 DIAGNOSIS — I10 Essential (primary) hypertension: Secondary | ICD-10-CM

## 2010-07-30 DIAGNOSIS — Z86010 Personal history of colonic polyps: Secondary | ICD-10-CM

## 2010-07-30 DIAGNOSIS — M899 Disorder of bone, unspecified: Secondary | ICD-10-CM

## 2010-07-30 DIAGNOSIS — M949 Disorder of cartilage, unspecified: Secondary | ICD-10-CM

## 2010-07-30 MED ORDER — LEVOTHYROXINE SODIUM 50 MCG PO TABS: 50.0000 ug | ORAL_TABLET | Freq: Every day | ORAL | Status: DC

## 2010-07-30 MED ORDER — HYDROCHLOROTHIAZIDE 25 MG PO TABS: ORAL_TABLET | ORAL | Status: DC

## 2010-07-30 MED ORDER — METOPROLOL SUCCINATE ER 50 MG PO TB24: ORAL_TABLET | ORAL | Status: DC

## 2010-07-30 NOTE — Patient Instructions (Signed)
Preventive Health Care: Exercise  30-45  minutes a day, 3-4 days a week. Walking is especially valuable in preventing Osteoporosis. Eat a low-fat diet with lots of fruits and vegetables, up to 7-9 servings per day. Consume less than 30 grams of sugar per day from foods & drinks with High Fructose Corn Syrup as #2,3 or #4 on label. Please  schedule fasting Labs : BMET,Lipids, hepatic panel, CBC & dif, TSH, vitamin D level; see Diagnoses for Codes.  

## 2010-07-30 NOTE — Progress Notes (Signed)
  Subjective:    Patient ID: Renee Santos, female    DOB: 06/27/1945, 65 y.o.   MRN: [id number]  HPI  Renee Santos  is here for a physical; she has no acute issues.      Review of Systems Patient reports no significant  vision/ hearing  changes, adenopathy,fever, weight change,  persistant / recurrent hoarseness , swallowing issues, chest pain,palpitations,edema,persistant /recurrent cough, hemoptysis, dyspnea( rest/ exertional/paroxysmal nocturnal), gastrointestinal bleeding(melena, rectal bleeding), abdominal pain, significant heartburn,  bowel changes,GU symptoms(dysuria, hematuria,pyuria, incontinence) ), Gyn symptoms(abnormal  bleeding , pain),  syncope, focal weakness, memory loss,numbness & tingling, skin/hair /nail changes,abnormal  bleeding, anxiety,or depression. Bruising on 81 mg of ASA daily.     Objective:   Physical Exam Gen.: Healthy and well-nourished in appearance. Alert, appropriate and cooperative throughout exam. Head: Normocephalic without obvious abnormalities Eyes: No corneal or conjunctival inflammation noted. Pupils equal round reactive to light and accommodation. Fundal exam is benign without hemorrhages, exudate, papilledema. Extraocular motion intact. Vision grossly normal. Ears: External  ear exam reveals no significant lesions or deformities. Canals clear .TMs normal. Hearing is grossly normal bilaterally. Nose: External nasal exam reveals no deformity or inflammation. Nasal mucosa are pink and moist. No lesions or exudates noted.  Mouth: Oral mucosa and oropharynx reveal no lesions or exudates. Teeth in good repair. Neck: No deformities, masses, or tenderness noted. Range of motion normal. Thyroid asymmetric , small & firm. Lungs: Normal respiratory effort; chest expands symmetrically. Lungs are clear to auscultation without rales, wheezes, or increased work of breathing. Heart: Normal rate and rhythm. Normal S1 and S2. No gallop, click, or rub. S4 w/o murmur. Abdomen:  Bowel sounds normal; abdomen soft and nontender. No masses, organomegaly or hernias noted. Musculoskeletal/extremities: No deformity or scoliosis noted of  the thoracic or lumbar spine. No clubbing, cyanosis, edema, or deformity noted. Range of motion  normal .Tone & strength  normal.Joints normal. Nail health  good. Vascular: Carotid, radial artery, dorsalis pedis and dorsalis posterior tibial pulses are full and equal. No bruits present. Neurologic: Alert and oriented x3. Deep tendon reflexes symmetrical and 1/2+l.          Skin: Intact without suspicious lesions or rashes. Lymph: No cervical, axillary, or inguinal lymphadenopathy present .Psych: Mood and affect are normal. Normally interactive                                                                                         Assessment & Plan:  #1 comprehensive physical exam; no acute findings #2 see Problem List with Assessments & Recommendations Plan: see Orders

## 2010-08-03 ENCOUNTER — Other Ambulatory Visit: Payer: Managed Care, Other (non HMO)

## 2010-08-07 ENCOUNTER — Other Ambulatory Visit: Payer: Self-pay | Admitting: Internal Medicine

## 2010-08-07 DIAGNOSIS — E041 Nontoxic single thyroid nodule: Secondary | ICD-10-CM

## 2010-08-07 DIAGNOSIS — I10 Essential (primary) hypertension: Secondary | ICD-10-CM

## 2010-08-07 DIAGNOSIS — Z86010 Personal history of colonic polyps: Secondary | ICD-10-CM

## 2010-08-07 DIAGNOSIS — E782 Mixed hyperlipidemia: Secondary | ICD-10-CM

## 2010-08-07 DIAGNOSIS — M949 Disorder of cartilage, unspecified: Secondary | ICD-10-CM

## 2010-08-07 DIAGNOSIS — Z0000 Encounter for general adult medical examination without abnormal findings: Secondary | ICD-10-CM

## 2010-08-08 ENCOUNTER — Other Ambulatory Visit (INDEPENDENT_AMBULATORY_CARE_PROVIDER_SITE_OTHER): Payer: Managed Care, Other (non HMO)

## 2010-08-08 DIAGNOSIS — I10 Essential (primary) hypertension: Secondary | ICD-10-CM

## 2010-08-08 DIAGNOSIS — Z86010 Personal history of colonic polyps: Secondary | ICD-10-CM

## 2010-08-08 DIAGNOSIS — E782 Mixed hyperlipidemia: Secondary | ICD-10-CM

## 2010-08-08 DIAGNOSIS — E041 Nontoxic single thyroid nodule: Secondary | ICD-10-CM

## 2010-08-08 DIAGNOSIS — Z0000 Encounter for general adult medical examination without abnormal findings: Secondary | ICD-10-CM

## 2010-08-08 DIAGNOSIS — E785 Hyperlipidemia, unspecified: Secondary | ICD-10-CM

## 2010-08-08 DIAGNOSIS — M899 Disorder of bone, unspecified: Secondary | ICD-10-CM

## 2010-08-08 LAB — BASIC METABOLIC PANEL
CO2: 28 mEq/L (ref 19–32)
Chloride: 106 mEq/L (ref 96–112)
GFR: 57.47 mL/min — ABNORMAL LOW (ref >60.00)
Glucose, Bld: 84 mg/dL (ref 70–99)
Potassium: 3.9 mEq/L (ref 3.5–5.1)
Sodium: 141 mEq/L (ref 135–145)

## 2010-08-08 LAB — LIPID PANEL
Cholesterol: 206 mg/dL — ABNORMAL HIGH (ref 0–200)
HDL: 95.6 mg/dL
Total CHOL/HDL Ratio: 2
Triglycerides: 104 mg/dL (ref 0.0–149.0)
VLDL: 20.8 mg/dL (ref 0.0–40.0)

## 2010-08-08 LAB — CBC WITH DIFFERENTIAL/PLATELET
Basophils Absolute: 0 10*3/uL (ref 0.0–0.1)
Basophils Relative: 0.5 % (ref 0.0–3.0)
Eosinophils Absolute: 0.1 10*3/uL (ref 0.0–0.7)
Eosinophils Relative: 1.5 % (ref 0.0–5.0)
HCT: 36.8 % (ref 36.0–46.0)
Hemoglobin: 12.8 g/dL (ref 12.0–15.0)
Lymphocytes Relative: 39.3 % (ref 12.0–46.0)
Lymphs Abs: 1.5 10*3/uL (ref 0.7–4.0)
MCHC: 34.7 g/dL (ref 30.0–36.0)
MCV: 99.5 fl (ref 78.0–100.0)
Monocytes Absolute: 0.3 10*3/uL (ref 0.1–1.0)
Monocytes Relative: 8.6 % (ref 3.0–12.0)
Neutro Abs: 2 10*3/uL (ref 1.4–7.7)
Neutrophils Relative %: 50.1 % (ref 43.0–77.0)
Platelets: 198 10*3/uL (ref 150.0–400.0)
RBC: 3.7 Mil/uL — ABNORMAL LOW (ref 3.87–5.11)
RDW: 12.8 % (ref 11.5–14.6)
WBC: 3.9 10*3/uL — ABNORMAL LOW (ref 4.5–10.5)

## 2010-08-08 LAB — HEPATIC FUNCTION PANEL
Bilirubin, Direct: 0.1 mg/dL (ref 0.0–0.3)
Total Protein: 6.3 g/dL (ref 6.0–8.3)

## 2010-08-08 NOTE — Progress Notes (Signed)
12  

## 2010-08-08 NOTE — Progress Notes (Signed)
Labs only

## 2010-08-09 ENCOUNTER — Ambulatory Visit
Admission: RE | Admit: 2010-08-09 | Discharge: 2010-08-09 | Disposition: A | Discharge status: Home / Self Care | Payer: Managed Care, Other (non HMO) | Source: Ambulatory Visit | Attending: Internal Medicine | Admitting: Internal Medicine

## 2010-08-09 DIAGNOSIS — E041 Nontoxic single thyroid nodule: Secondary | ICD-10-CM

## 2010-08-11 ENCOUNTER — Encounter: Payer: Self-pay | Admitting: Internal Medicine

## 2010-08-25 NOTE — Telephone Encounter (Signed)
Gastroenterology states that colonoscopy is not due at this time

## 2010-10-31 ENCOUNTER — Other Ambulatory Visit: Payer: Self-pay | Admitting: Internal Medicine

## 2010-10-31 DIAGNOSIS — I10 Essential (primary) hypertension: Secondary | ICD-10-CM

## 2010-10-31 MED ORDER — HYDROCHLOROTHIAZIDE 25 MG PO TABS: ORAL_TABLET | ORAL | Status: DC

## 2010-10-31 NOTE — Telephone Encounter (Signed)
RX sent

## 2010-11-06 ENCOUNTER — Other Ambulatory Visit (INDEPENDENT_AMBULATORY_CARE_PROVIDER_SITE_OTHER): Payer: Managed Care, Other (non HMO)

## 2010-11-06 DIAGNOSIS — I10 Essential (primary) hypertension: Secondary | ICD-10-CM

## 2010-11-06 DIAGNOSIS — E785 Hyperlipidemia, unspecified: Secondary | ICD-10-CM

## 2010-11-06 DIAGNOSIS — M8580 Other specified disorders of bone density and structure, unspecified site: Secondary | ICD-10-CM

## 2010-11-06 DIAGNOSIS — E039 Hypothyroidism, unspecified: Secondary | ICD-10-CM

## 2010-11-06 LAB — CBC WITH DIFFERENTIAL/PLATELET
Basophils Relative: 0.5 % (ref 0.0–3.0)
Eosinophils Absolute: 0 10*3/uL (ref 0.0–0.7)
HCT: 40 % (ref 36.0–46.0)
Hemoglobin: 13.3 g/dL (ref 12.0–15.0)
Lymphocytes Relative: 28.7 % (ref 12.0–46.0)
Lymphs Abs: 1.3 10*3/uL (ref 0.7–4.0)
MCHC: 33.2 g/dL (ref 30.0–36.0)
MCV: 99.4 fl (ref 78.0–100.0)
Monocytes Absolute: 0.3 10*3/uL (ref 0.1–1.0)
Neutro Abs: 2.8 10*3/uL (ref 1.4–7.7)
RBC: 4.02 Mil/uL (ref 3.87–5.11)
RDW: 13.1 % (ref 11.5–14.6)

## 2010-11-06 LAB — HEPATIC FUNCTION PANEL
Alkaline Phosphatase: 51 U/L (ref 39–117)
Bilirubin, Direct: 0 mg/dL (ref 0.0–0.3)
Total Bilirubin: 0.6 mg/dL (ref 0.3–1.2)

## 2010-11-06 LAB — BASIC METABOLIC PANEL
BUN: 21 mg/dL (ref 6–23)
CO2: 29 mEq/L (ref 19–32)
Glucose, Bld: 78 mg/dL (ref 70–99)
Potassium: 4.8 mEq/L (ref 3.5–5.1)
Sodium: 142 mEq/L (ref 135–145)

## 2010-11-06 LAB — LIPID PANEL
HDL: 115 mg/dL (ref >39.00)
Total CHOL/HDL Ratio: 2
VLDL: 18.6 mg/dL (ref 0.0–40.0)

## 2010-11-21 ENCOUNTER — Other Ambulatory Visit: Payer: Self-pay | Admitting: Dermatology

## 2010-11-29 ENCOUNTER — Telehealth: Payer: Self-pay

## 2010-11-29 NOTE — Telephone Encounter (Signed)
Message left on voicemail: Patient would like a call concerning her labs  I called patient x 2 line busy, will try again tomorrow

## 2010-11-30 NOTE — Telephone Encounter (Signed)
Spoke with patient, patient would like labs sent to Dr.Jordan (Amy)-dermatologist.   Labs sent to [phone], and mailed to patient

## 2010-12-04 ENCOUNTER — Encounter: Payer: Self-pay | Admitting: Family Medicine

## 2010-12-04 ENCOUNTER — Ambulatory Visit (INDEPENDENT_AMBULATORY_CARE_PROVIDER_SITE_OTHER): Payer: Managed Care, Other (non HMO) | Admitting: Family Medicine

## 2010-12-04 ENCOUNTER — Ambulatory Visit: Payer: Managed Care, Other (non HMO) | Admitting: Internal Medicine

## 2010-12-04 VITALS — BP 108/66 | HR 75 | Temp 99.2°F | Wt 112.8 lb

## 2010-12-04 DIAGNOSIS — Z23 Encounter for immunization: Secondary | ICD-10-CM

## 2010-12-04 DIAGNOSIS — L0291 Cutaneous abscess, unspecified: Secondary | ICD-10-CM

## 2010-12-04 MED ORDER — SULFAMETHOXAZOLE-TMP DS 800-160 MG PO TABS: 1.0000 | ORAL_TABLET | Freq: Two times a day (BID) | ORAL | Status: DC

## 2010-12-04 NOTE — Patient Instructions (Signed)

## 2010-12-04 NOTE — Progress Notes (Signed)
  Subjective:    Patient ID: Renee Santos, female    DOB: 10/27/1945, 65 y.o.   MRN: [id number]  HPI  Pt here c/o abscess L groin that is draining and causing pain.  Review of Systems    as above Objective:   Physical Exam  Constitutional: She is oriented to person, place, and time. She appears well-developed and well-nourished.  Neurological: She is alert and oriented to person, place, and time.  Skin:       L groin-- + abscess, draining purulent drainage---culture done ,    Psychiatric: She has a normal mood and affect. Her behavior is normal. Judgment and thought content normal.          Assessment & Plan:  Abscess---  Bactrim for 10 days                  Culture done                  Soak in bath with epson salts                      Refer to surgery if no relief

## 2010-12-09 LAB — CULTURE, ROUTINE-ABSCESS
Gram Stain: NONE SEEN
Gram Stain: NONE SEEN

## 2011-01-07 ENCOUNTER — Encounter: Payer: Self-pay | Admitting: Family Medicine

## 2011-01-07 ENCOUNTER — Ambulatory Visit (INDEPENDENT_AMBULATORY_CARE_PROVIDER_SITE_OTHER): Payer: Managed Care, Other (non HMO) | Admitting: Family Medicine

## 2011-01-07 VITALS — BP 122/70 | HR 72 | Temp 97.9°F | Wt 114.2 lb

## 2011-01-07 DIAGNOSIS — M549 Dorsalgia, unspecified: Secondary | ICD-10-CM

## 2011-01-07 MED ORDER — HYDROCODONE-ACETAMINOPHEN 7.5-750 MG PO TABS: 1.0000 | ORAL_TABLET | Freq: Four times a day (QID) | ORAL | Status: DC | PRN

## 2011-01-07 MED ORDER — CYCLOBENZAPRINE HCL 10 MG PO TABS: 10.0000 mg | ORAL_TABLET | Freq: Three times a day (TID) | ORAL | Status: DC | PRN

## 2011-01-07 NOTE — Patient Instructions (Signed)
Back Pain, Adult Low back pain is very common. About 1 in 5 people have back pain.The cause of low back pain is rarely dangerous. The pain often gets better over time.About half of people with a sudden onset of back pain feel better in just 2 weeks. About 8 in 10 people feel better by 6 weeks.  CAUSES Some common causes of back pain include:  Strain of the muscles or ligaments supporting the spine.   Wear and tear (degeneration) of the spinal discs.   Arthritis.   Direct injury to the back.  DIAGNOSIS Most of the time, the direct cause of low back pain is not known.However, back pain can be treated effectively even when the exact cause of the pain is unknown.Answering your caregiver's questions about your overall health and symptoms is one of the most accurate ways to make sure the cause of your pain is not dangerous. If your caregiver needs more information, he or she may order lab work or imaging tests (X-rays or MRIs).However, even if imaging tests show changes in your back, this usually does not require surgery. HOME CARE INSTRUCTIONS For many people, back pain returns.Since low back pain is rarely dangerous, it is often a condition that people can learn to manageon their own.   Remain active. It is stressful on the back to sit or stand in one place. Do not sit, drive, or stand in one place for more than 30 minutes at a time. Take short walks on level surfaces as soon as pain allows.Try to increase the length of time you walk each day.   Do not stay in bed.Resting more than 1 or 2 days can delay your recovery.   Do not avoid exercise or work.Your body is made to move.It is not dangerous to be active, even though your back may hurt.Your back will likely heal faster if you return to being active before your pain is gone.   Pay attention to your body when you bend and lift. Many people have less discomfortwhen lifting if they bend their knees, keep the load close to their  bodies,and avoid twisting. Often, the most comfortable positions are those that put less stress on your recovering back.   Find a comfortable position to sleep. Use a firm mattress and lie on your side with your knees slightly bent. If you lie on your back, put a pillow under your knees.   Only take over-the-counter or prescription medicines as directed by your caregiver. Over-the-counter medicines to reduce pain and inflammation are often the most helpful.Your caregiver may prescribe muscle relaxant drugs.These medicines help dull your pain so you can more quickly return to your normal activities and healthy exercise.   Put ice on the injured area.   Put ice in a plastic bag.   Place a towel between your skin and the bag.   Leave the ice on for 15 to 20 minutes, 3 to 4 times a day for the first 2 to 3 days. After that, ice and heat may be alternated to reduce pain and spasms.   Ask your caregiver about trying back exercises and gentle massage. This may be of some benefit.   Avoid feeling anxious or stressed.Stress increases muscle tension and can worsen back pain.It is important to recognize when you are anxious or stressed and learn ways to manage it.Exercise is a great option.  SEEK MEDICAL CARE IF:  You have pain that is not relieved with rest or medicine.   You have   pain that does not improve in 1 week.   You have new symptoms.   You are generally not feeling well.  SEEK IMMEDIATE MEDICAL CARE IF:   You have pain that radiates from your back into your legs.   You develop new bowel or bladder control problems.   You have unusual weakness or numbness in your arms or legs.   You develop nausea or vomiting.   You develop abdominal pain.   You feel faint.  Document Released: 01/28/2005 Document Revised: 10/10/2010 Document Reviewed: 06/18/2010 [employer] Patient Information 2012 [employer]. 

## 2011-01-07 NOTE — Progress Notes (Signed)
  Subjective:    Renee Santos is a 65 y.o. female who presents for evaluation of low back pain. The patient has had no prior back problems. Symptoms have been present for 4 days and are gradually worsening.  Onset was related to / precipitated by no known injury. The pain is located in the across the lower back and radiates to the groin. The pain is described as sharp and occurs all day. She rates her pain as a 5 on a scale of 0-10. Symptoms are exacerbated by lying down and sitting. Symptoms are improved by nothing. She has also tried acetaminophen and NSAIDs which provided no symptom relief. She has no other symptoms associated with the back pain. The patient has no "red flag" history indicative of complicated back pain.  The following portions of the patient's history were reviewed and updated as appropriate: allergies, current medications, past family history, past medical history, past social history, past surgical history and problem list.  Review of Systems Pertinent items are noted in HPI.    Objective:   Inspection and palpation: inspection of back is normal. Muscle tone and ROM exam: muscle spasm noted low back. Straight leg raise: positive at 45 degrees on the right. Neurological: normal DTRs, muscle strength and reflexes.    Assessment:    Nonspecific acute low back pain    Plan:    Natural history and expected course discussed. Questions answered. Agricultural engineer distributed. Proper lifting, bending technique discussed. Short (2-4 day) period of relative rest recommended until acute symptoms improve. Ice to affected area as needed for local pain relief. Heat to affected area as needed for local pain relief. Muscle relaxants per medication orders. Follow-up in 2 weeks.

## 2011-01-16 ENCOUNTER — Encounter: Payer: Self-pay | Admitting: Internal Medicine

## 2011-02-12 DIAGNOSIS — D3617 Benign neoplasm of peripheral nerves and autonomic nervous system of trunk, unspecified: Secondary | ICD-10-CM

## 2011-02-12 HISTORY — DX: Benign neoplasm of peripheral nerves and autonomic nervous system of trunk, unspecified: D36.17

## 2011-03-07 ENCOUNTER — Encounter: Payer: Self-pay | Admitting: Family Medicine

## 2011-03-07 ENCOUNTER — Ambulatory Visit (INDEPENDENT_AMBULATORY_CARE_PROVIDER_SITE_OTHER): Payer: Managed Care, Other (non HMO) | Admitting: Family Medicine

## 2011-03-07 VITALS — BP 124/82 | HR 85 | Temp 97.9°F | Wt 111.2 lb

## 2011-03-07 DIAGNOSIS — H6690 Otitis media, unspecified, unspecified ear: Secondary | ICD-10-CM

## 2011-03-07 MED ORDER — CEFUROXIME AXETIL 500 MG PO TABS: 500.0000 mg | ORAL_TABLET | Freq: Two times a day (BID) | ORAL | Status: AC

## 2011-03-07 MED ORDER — METHYLPREDNISOLONE ACETATE PF 80 MG/ML IJ SUSP
80.0000 mg | Freq: Once | INTRAMUSCULAR | Status: AC
  Administered 2011-03-07: 80 mg via INTRAMUSCULAR

## 2011-03-07 NOTE — Patient Instructions (Signed)

## 2011-03-07 NOTE — Progress Notes (Signed)
  Subjective:     Renee Santos is a 66 y.o. female who presents for evaluation of symptoms of a URI. Symptoms include bilateral ear pressure/pain, congestion, facial pain, nasal congestion, no  fever and sinus pressure. Onset of symptoms was 2 days ago, and has been gradually worsening since that time. Treatment to date: none.  The following portions of the patient's history were reviewed and updated as appropriate: allergies, current medications, past family history, past medical history, past social history, past surgical history and problem list.  Review of Systems Pertinent items are noted in HPI.   Objective:    BP 124/82  Pulse 85  Temp(Src) 97.9 F (36.6 C) (Oral)  Wt 111 lb 3.2 oz (50.44 kg)  SpO2 99% General appearance: alert, cooperative, appears stated age and no distress Ears: abnormal TM right ear - diminished mobility, erythematous and bulging and abnormal TM left ear - diminished mobility, erythematous and retracted Nose: clear discharge, moderate congestion, turbinates red, swollen Neck: mild anterior cervical adenopathy, supple, symmetrical, trachea midline and thyroid not enlarged, symmetric, no tenderness/mass/nodules Lungs: clear to auscultation bilaterally Heart: S1, S2 normal   Assessment:    otitis media   Plan:    Discussed diagnosis and treatment of URI. Suggested symptomatic OTC remedies. Nasal saline spray for congestion. Ceftin per orders. Nasal steroids per orders. Follow up as needed.

## 2011-04-29 ENCOUNTER — Other Ambulatory Visit: Payer: Self-pay | Admitting: Internal Medicine

## 2011-05-06 ENCOUNTER — Other Ambulatory Visit: Payer: Self-pay | Admitting: *Deleted

## 2011-05-06 MED ORDER — LEVOTHYROXINE SODIUM 50 MCG PO TABS: ORAL_TABLET | ORAL | Status: DC

## 2011-05-06 NOTE — Telephone Encounter (Signed)
Rx sent 

## 2011-08-16 ENCOUNTER — Other Ambulatory Visit: Payer: Self-pay | Admitting: Internal Medicine

## 2011-08-27 ENCOUNTER — Telehealth: Payer: Self-pay | Admitting: Internal Medicine

## 2011-08-27 NOTE — Telephone Encounter (Signed)
Caller: Santiana/Patient; PCP: Marga Melnick; CB#: [phone]; Call regarding Boil on R labia.  Onset: 08/20/11. Afebrile.  Boil is < dime sized.  Been doing baking soda soaks 1-2/day without improvment. Advised to see MD within 24 hrs for painful lesions unrelieved with home care measures per Skin Lesions Guideline. Prefers to see female provider for this visit. No appt remain for 08/27/11 or 08/28/11.  Would like appt for Thursday 08/29/11 at 0915 with Dr Laury Axon; info forwarded to [id number] for call back r/t appt within 24 hrs. Please call back.

## 2011-08-27 NOTE — Telephone Encounter (Signed)
I spoke with patient, patient is schedule to see Dr.Tabori on Thursday

## 2011-08-29 ENCOUNTER — Ambulatory Visit (INDEPENDENT_AMBULATORY_CARE_PROVIDER_SITE_OTHER): Payer: Managed Care, Other (non HMO) | Admitting: Family Medicine

## 2011-08-29 ENCOUNTER — Encounter: Payer: Self-pay | Admitting: Family Medicine

## 2011-08-29 VITALS — BP 115/77 | HR 76 | Temp 97.9°F | Ht 62.0 in | Wt 110.0 lb

## 2011-08-29 DIAGNOSIS — N764 Abscess of vulva: Secondary | ICD-10-CM | POA: Insufficient documentation

## 2011-08-29 NOTE — Progress Notes (Signed)
  Subjective:    Patient ID: Renee Santos, female    DOB: Apr 11, 1945, 66 y.o.   MRN: [id number]  HPI Groin boil- R sided, hx of similar x2 last occurred 1 yr ago.  First noticed 1 week ago.  'i felt bad'.  Popped yesterday in the pool.  'feels much better'.  Still somewhat painful.  Walking 4 miles daily.  Wears nylon underwear   Review of Systems For ROS see HPI     Objective:   Physical Exam  Vitals reviewed. Constitutional: She appears well-developed and well-nourished. No distress.  Skin: Skin is warm and dry.       1 cm area of induration on R labia majora w/out fluctuance or drainage.  Mild erythema.          Assessment & Plan:

## 2011-08-29 NOTE — Patient Instructions (Addendum)
Schedule your 15 minute breast exam appt for late Oct or early Nov The boil looks good Continue to apply warm compresses, hot soaks Try and keep area clean and dry Wear breathable fabrics If you again develop pain or worsening swelling- call for antibiotics Call with any questions or concerns Enjoy the rest of your summer!!!

## 2011-08-29 NOTE — Assessment & Plan Note (Signed)
New.  Area has spontaneously drained and per pt report is much improved.  No fluctuance or area to I&D.  No need for abx.  Reviewed cause and supportive tx.  Reviewed red flags that should prompt return.  Pt expressed understanding and is in agreement w/ plan.

## 2011-09-24 ENCOUNTER — Encounter: Payer: Self-pay | Admitting: Gynecology

## 2011-09-24 ENCOUNTER — Ambulatory Visit (INDEPENDENT_AMBULATORY_CARE_PROVIDER_SITE_OTHER): Payer: Managed Care, Other (non HMO) | Admitting: Gynecology

## 2011-09-24 VITALS — BP 118/70 | Ht 62.0 in | Wt 109.0 lb

## 2011-09-24 DIAGNOSIS — N951 Menopausal and female climacteric states: Secondary | ICD-10-CM

## 2011-09-24 DIAGNOSIS — Z01419 Encounter for gynecological examination (general) (routine) without abnormal findings: Secondary | ICD-10-CM

## 2011-09-24 MED ORDER — ESTRADIOL 0.05 MG/24HR TD PTTW: 1.0000 | MEDICATED_PATCH | TRANSDERMAL | Status: DC

## 2011-09-24 NOTE — Progress Notes (Addendum)
Renee Santos [birth year] [id number]        66 y.o.  G2P2 new patient for annual exam.  Former patient of Dr. Kyra Santos. Several issues noted below.  Past medical history,surgical history, medications, allergies, family history and social history were all reviewed and documented in the EPIC chart. ROS:  Was performed and pertinent positives and negatives are included in the history.  Exam: [name] assistant Filed Vitals:   09/24/11 1135  BP: 118/70  Height: 5\' 2"  (1.575 m)  Weight: 109 lb (49.442 kg)   General appearance  Normal Skin grossly normal Head/Neck normal with no cervical or supraclavicular adenopathy thyroid normal Lungs  clear Cardiac RR, without RMG Abdominal  soft, nontender, without masses, organomegaly or hernia Breasts  examined lying and sitting without masses, retractions, discharge or axillary adenopathy. Pelvic  Ext/BUS/vagina  normal with mild atrophic changes  Adnexa  Without masses or tenderness    Anus and perineum  normal   Rectovaginal  normal sphincter tone without palpated masses or tenderness.    Assessment/Plan:  66 y.o. G2P2 female for annual exam.   1. History of TAH/LSO for endometrioma age 89. Subsequently developed menopausal symptoms age 88 and was placed on ERT Premarin. She has been on this continuously up until approximately a month ago. She stopped the Premarin and developed hot flushes and night sweats and sleep disturbances. I reviewed ERT with her. The WHI study with increased risk of stroke heart attack DVT, breast cancer. The ACOG/NAMS recommendation for lowest dose for shortest period of time. Transdermal/first pass effect issues reviewed. After a lengthy discussion patient wants to reinitiate and I gave her samples of many Vivelle 0.05 patches x1 month and an annual refill. Assuming she does well with that she'll continue it otherwise she will call me. 2. Due for mammogram, colonoscopy and bone density. These are arranged through Dr.  Caryl Santos office and she will follow up for these as she has an appointment to see in the next month. Increase calcium vitamin D reviewed. 3. Pap smear. Last Pap smear 2011. No Pap smear done. I reviewed current screening guidelines and as she is status post hysterectomy for benign indications and approaching 67. Screening and she is comfortable with this. 4. Health maintenance. No lab work was done this is all done through Dr. Caryl Santos office. She will see me in a year, sooner as needed.  Patient did hand carry records from Dr. Elana Santos that I reviewed and returned to her.   [name] MD, 12:37 PM 09/24/2011

## 2011-09-24 NOTE — Patient Instructions (Addendum)
Start on estrogen patches as we discussed. Call me if they have any questions or issues.   Hormone Therapy At menopause, your body begins making less estrogen and progesterone hormones. This causes the body to stop having menstrual periods. This is because estrogen and progesterone hormones control your periods and menstrual cycle. A lack of estrogen may cause symptoms such as:  Hot flushes (or hot flashes).   Vaginal dryness.   Dry skin.   Loss of sex drive.   Risk of bone loss (osteoporosis).  When this happens, you may choose to take hormone therapy to get back the estrogen lost during menopause. When the hormone estrogen is given alone, it is usually referred to as ET (Estrogen Therapy). When the hormone progestin is combined with estrogen, it is generally called HT (Hormone Therapy). This was formerly known as hormone replacement therapy (HRT). Your caregiver can help you make a decision on what will be best for you. The decision to use HT seems to change often as new studies are done. Many studies do not agree on the benefits of hormone replacement therapy. LIKELY BENEFITS OF HT INCLUDE PROTECTION FROM:  Hot Flushes (also called hot flashes) - A hot flush is a sudden feeling of heat that spreads over the face and body. The skin may redden like a blush. It is connected with sweats and sleep disturbance. Women going through menopause may have hot flushes a few times a month or several times per day depending on the woman.   Osteoporosis (bone loss)- Estrogen helps guard against bone loss. After menopause, a woman's bones slowly lose calcium and become weak and brittle. As a result, bones are more likely to break. The hip, wrist, and spine are affected most often. Hormone therapy can help slow bone loss after menopause. Weight bearing exercise and taking calcium with vitamin D also can help prevent bone loss. There are also medications that your caregiver can prescribe that can help prevent  osteoporosis.   Vaginal Dryness - Loss of estrogen causes changes in the vagina. Its lining may become thin and dry. These changes can cause pain and bleeding during sexual intercourse. Dryness can also lead to infections. This can cause burning and itching. (Vaginal estrogen treatment can help relieve pain, itching, and dryness.)   Urinary Tract Infections are more common after menopause because of lack of estrogen. Some women also develop urinary incontinence because of low estrogen levels in the vagina and bladder.   Possible other benefits of estrogen include a positive effect on mood and short-term memory in women.  RISKS AND COMPLICATIONS  Using estrogen alone without progesterone causes the lining of the uterus to grow. This increases the risk of lining of the uterus (endometrial) cancer. Your caregiver should give another hormone called progestin if you have a uterus.   Women who take combined (estrogen and progestin) HT appear to have an increased risk of breast cancer. The risk appears to be small, but increases throughout the time that HT is taken.   Combined therapy also makes the breast tissue slightly denser which makes it harder to read mammograms (breast X-rays).   Combined, estrogen and progesterone therapy can be taken together every day, in which case there may be spotting of blood. HT therapy can be taken cyclically in which case you will have menstrual periods. Cyclically means HT is taken for a set amount of days, then not taken, then this process is repeated.   HT may increase the risk of stroke, heart  attack, breast cancer and forming blood clots in your leg.   Transdermal estrogen (estrogen that is absorbed through the skin with a patch or a cream) may have more positive results with:   Cholesterol.   Blood pressure.   Blood clots.  Having the following conditions may indicate you should not have HT:  Endometrial cancer.   Liver disease.   Breast cancer.    Heart disease.   History of blood clots.   Stroke.  TREATMENT   If you choose to take HT and have a uterus, usually estrogen and progestin are prescribed.   Your caregiver will help you decide the best way to take the medications.   Possible ways to take estrogen include:   Pills.   Patches.   Gels.   Sprays.   Vaginal estrogen cream, rings and tablets.   It is best to take the lowest dose possible that will help your symptoms and take them for the shortest period of time that you can.   Hormone therapy can help relieve some of the problems (symptoms) that affect women at menopause. Before making a decision about HT, talk to your caregiver about what is best for you. Be well informed and comfortable with your decisions.  HOME CARE INSTRUCTIONS   Follow your caregivers advice when taking the medications.   A Pap test is done to screen for cervical cancer.   The first Pap test should be done at age 18.   Between ages 61 and 31, Pap tests are repeated every 2 years.   Beginning at age 10, you are advised to have a Pap test every 3 years as long as your past 3 Pap tests have been normal.   Some women have medical problems that increase the chance of getting cervical cancer. Talk to your caregiver about these problems. It is especially important to talk to your caregiver if a new problem develops soon after your last Pap test. In these cases, your caregiver may recommend more frequent screening and Pap tests.   The above recommendations are the same for women who have or have not gotten the vaccine for HPV (Human Papillomavirus).   If you had a hysterectomy for a problem that was not a cancer or a condition that could lead to cancer, then you no longer need Pap tests. However, even if you no longer need a Pap test, a regular exam is a good idea to make sure no other problems are starting.    If you are between ages 26 and 72, and you have had normal Pap tests going back  10 years, you no longer need Pap tests. However, even if you no longer need a Pap test, a regular exam is a good idea to make sure no other problems are starting.    If you have had past treatment for cervical cancer or a condition that could lead to cancer, you need Pap tests and screening for cancer for at least 20 years after your treatment.   If Pap tests have been discontinued, risk factors (such as a new sexual partner) need to be re-assessed to determine if screening should be resumed.   Some women may need screenings more often if they are at high risk for cervical cancer.   Get mammograms done as per the advice of your caregiver.  SEEK IMMEDIATE MEDICAL CARE IF:  You develop abnormal vaginal bleeding.   You have pain or swelling in your legs, shortness of breath, or chest pain.  You develop dizziness or headaches.   You have lumps or changes in your breasts or armpits.   You have slurred speech.   You develop weakness or numbness of your arms or legs.   You have pain, burning, or bleeding when urinating.   You develop abdominal pain.  Document Released: 10/27/2002 Document Revised: 01/17/2011 Document Reviewed: 02/14/2010 [employer] Patient Information 2012 [employer]. 

## 2011-09-25 ENCOUNTER — Other Ambulatory Visit: Payer: Self-pay | Admitting: *Deleted

## 2011-09-25 MED ORDER — ESTRADIOL 0.05 MG/24HR TD PTTW: 1.0000 | MEDICATED_PATCH | TRANSDERMAL | Status: DC

## 2011-09-25 NOTE — Progress Notes (Signed)
Spoke with [employer] regarding estradiol (minivelle) 0.05 mg patch rx.  rx was resent to pharmacy.

## 2011-10-28 ENCOUNTER — Other Ambulatory Visit: Payer: Self-pay | Admitting: Internal Medicine

## 2011-11-05 ENCOUNTER — Ambulatory Visit (INDEPENDENT_AMBULATORY_CARE_PROVIDER_SITE_OTHER): Payer: Managed Care, Other (non HMO) | Admitting: Internal Medicine

## 2011-11-05 ENCOUNTER — Encounter: Payer: Self-pay | Admitting: Internal Medicine

## 2011-11-05 VITALS — BP 122/70 | HR 64 | Temp 98.2°F | Resp 12 | Ht 61.5 in | Wt 109.4 lb

## 2011-11-05 DIAGNOSIS — Z0000 Encounter for general adult medical examination without abnormal findings: Secondary | ICD-10-CM

## 2011-11-05 DIAGNOSIS — M949 Disorder of cartilage, unspecified: Secondary | ICD-10-CM

## 2011-11-05 DIAGNOSIS — Z136 Encounter for screening for cardiovascular disorders: Secondary | ICD-10-CM

## 2011-11-05 DIAGNOSIS — M899 Disorder of bone, unspecified: Secondary | ICD-10-CM

## 2011-11-05 DIAGNOSIS — Z23 Encounter for immunization: Secondary | ICD-10-CM

## 2011-11-05 LAB — CBC WITH DIFFERENTIAL/PLATELET
Basophils Relative: 0.7 % (ref 0.0–3.0)
Eosinophils Relative: 0.6 % (ref 0.0–5.0)
Hemoglobin: 13.4 g/dL (ref 12.0–15.0)
Lymphocytes Relative: 35.4 % (ref 12.0–46.0)
Monocytes Relative: 8.1 % (ref 3.0–12.0)
Neutro Abs: 3.3 10*3/uL (ref 1.4–7.7)
Neutrophils Relative %: 55.2 % (ref 43.0–77.0)
RBC: 4.22 Mil/uL (ref 3.87–5.11)
WBC: 5.9 10*3/uL (ref 4.5–10.5)

## 2011-11-05 NOTE — Patient Instructions (Addendum)
Preventive Health Care: Eat a low-fat diet with lots of fruits and vegetables, up to 7-9 servings per day.  Blood Pressure Goal  Ideally is an AVERAGE < 135/85. This AVERAGE should be calculated from @ least 5-7 BP readings taken @ different times of day on different days of week. You should not respond to isolated BP readings , but rather the AVERAGE for that week. If you activate My Chart; the results can be released to you as soon as they populate from the lab. If you choose not to use this program; the labs have to be reviewed, copied & mailed  causing a delay in getting the results to you.

## 2011-11-05 NOTE — Progress Notes (Signed)
  Subjective:    Patient ID: Renee Santos, female    DOB: 1945/06/01, 66 y.o.   MRN: [id number]  HPI  Renee Santos is here for a physical; she denies acute issues .      Review of Systems  HYPERTENSION : Disease Monitoring: Blood pressure range-not monitored Chest pain, palpitations- no       Dyspnea- no Medications: Compliance- yes Lightheadedness,Syncope- no    Edema- no        Objective:   Physical Exam Gen.:  well-nourished in appearance. Alert, appropriate and cooperative throughout exam. Head: Normocephalic without obvious abnormalities Eyes: No corneal or conjunctival inflammation noted. Pupils equal round reactive to light and accommodation. Fundal exam is benign without hemorrhages, exudate, papilledema. Extraocular motion intact. Vision grossly normal with lenses. Ears: External  ear exam reveals no significant lesions or deformities. Canals clear .TMs normal. Hearing is grossly normal bilaterally. Nose: External nasal exam reveals no deformity or inflammation. Nasal mucosa are pink and moist. No lesions or exudates noted.  Mouth: Oral mucosa and oropharynx reveal no lesions or exudates. Teeth in good repair. Neck: No deformities, masses, or tenderness noted. Range of motion & Thyroid normal. Lungs: Normal respiratory effort; chest expands symmetrically. Lungs are clear to auscultation without rales, wheezes, or increased work of breathing. Heart: Normal rate and rhythm. Normal S1 and S2. No gallop, click, or rub. S4 w/o murmur. Abdomen: Bowel sounds normal; abdomen soft and nontender. No masses, organomegaly or hernias noted. Genitalia: Dr Audie Box                      Musculoskeletal/extremities: No deformity or scoliosis noted of  the thoracic or lumbar spine. No clubbing, cyanosis, edema, or deformity noted. Range of motion  normal .Tone & strength  normal.Joints normal. Nail health  good. Vascular: Carotid, radial artery, dorsalis pedis and  posterior tibial pulses are full  and equal. No bruits present. Neurologic: Alert and oriented x3. Deep tendon reflexes symmetrical and normal.          Skin: Intact without suspicious lesions or rashes. Minor scattered psoriasis lesions Lymph: No cervical, axillary lymphadenopathy present. Psych: Mood and affect are normal. Normally interactive                                                                                       Assessment & Plan:  #1 comprehensive physical exam; no acute findings #2 see Problem List with Assessments & Recommendations Plan: see Orders

## 2011-11-06 LAB — HEPATIC FUNCTION PANEL
AST: 28 U/L (ref 0–37)
Albumin: 4.2 g/dL (ref 3.5–5.2)
Alkaline Phosphatase: 54 U/L (ref 39–117)
Bilirubin, Direct: 0.1 mg/dL (ref 0.0–0.3)
Total Protein: 7.4 g/dL (ref 6.0–8.3)

## 2011-11-06 LAB — BASIC METABOLIC PANEL
CO2: 27 mEq/L (ref 19–32)
Calcium: 9.9 mg/dL (ref 8.4–10.5)
Creatinine, Ser: 0.9 mg/dL (ref 0.4–1.2)
GFR: 63.62 mL/min (ref >60.00)
Sodium: 138 mEq/L (ref 135–145)

## 2011-11-27 ENCOUNTER — Other Ambulatory Visit: Payer: Self-pay | Admitting: Dermatology

## 2011-11-29 ENCOUNTER — Encounter: Payer: Self-pay | Admitting: Internal Medicine

## 2011-12-09 ENCOUNTER — Ambulatory Visit (INDEPENDENT_AMBULATORY_CARE_PROVIDER_SITE_OTHER): Payer: Managed Care, Other (non HMO) | Admitting: Internal Medicine

## 2011-12-09 ENCOUNTER — Encounter: Payer: Self-pay | Admitting: Internal Medicine

## 2011-12-09 VITALS — BP 104/72 | HR 78 | Temp 98.2°F | Wt 108.0 lb

## 2011-12-09 DIAGNOSIS — J40 Bronchitis, not specified as acute or chronic: Secondary | ICD-10-CM

## 2011-12-09 MED ORDER — BENZONATATE 200 MG PO CAPS: 200.0000 mg | ORAL_CAPSULE | Freq: Two times a day (BID) | ORAL | Status: DC | PRN

## 2011-12-09 MED ORDER — AZITHROMYCIN 250 MG PO TABS: ORAL_TABLET | ORAL | Status: DC

## 2011-12-09 NOTE — Progress Notes (Signed)
  Subjective:    Patient ID: Renee Santos, female    DOB: 03-03-1945, 66 y.o.   MRN: [id number]  HPI Acute visit One-week history of cough, initially had a temperature up to 99.0, some yellow sputum production, over-the-counter Mucinex not helping much. Has been unable to sleep sometimes (d/t cough).  Also for the last few days has noted that her left knee to "give away". Denies any swelling, pain or injury. She is very active, walks 4 miles a day and   has to slow down.  Past Medical History  Diagnosis Date  . Thyroid nodule   . Cancer 2011    Basal Cell of nose; Dr Sw[place]  . Hyperlipidemia     due to very high HDL  . Hypertension   . Herpes zoster 2008    posterior thorax  . Neurofibroma of back 2013     Dr Amy [place]    Past Surgical History  Procedure Date  . Colonoscopy w/ polypectomy 2007  . Tonsillectomy   . Appendectomy   . Oophorectomy     LSO for cyst  . Abdominal hysterectomy      LSO  & appendectomy for endometrioma   @ age38 for ovarian cyst    Review of Systems Denies runny nose or sore throat. No sinus pain or congestion No nausea, vomiting, diarrhea    Objective:   Physical Exam General -- alert, well-developed, and well-nourished.   HEENT -- TMs right is poorly visualized, left is a slightly bulge, no red or discharge; throat w/o redness, face symmetric and not tender to palpation, nose is slightly congested Lungs --  some large airway congestion with cough, no crackles, difficulty breathing. Heart-- normal rate, regular rhythm, no murmur, and no gallop.   extremities--  no pretibial edema bilaterally Right knee normal Left knee: No effusion, Flonase or tenderness. Range of motion is normal, knees are stable to lateralization. Patellar normal, some deformities consistent with DJD  Psych-- Cognition and judgment appear intact. Alert and cooperative with normal attention span and concentration.  not anxious appearing and not depressed appearing.         Assessment & Plan:  Bronchitis, Findings and symptoms consistent with bronchitis, see instructions. In the past, responded very well to [employer].  Knee "giving away", Denies pain, no swelling on  exam, knee seems stable. Given description of symptoms, she needs to see orthopedic surgery, states she will do

## 2011-12-09 NOTE — Patient Instructions (Addendum)
Rest, fluids , tylenol For cough, take Mucinex DM twice a day as needed  If the pain continued, take the [facility] as needed Take the antibiotic as prescribed  (zpack) Call if no better in few days Call anytime if the symptoms are severe

## 2011-12-10 ENCOUNTER — Ambulatory Visit: Payer: Managed Care, Other (non HMO) | Admitting: Family Medicine

## 2011-12-17 ENCOUNTER — Telehealth: Payer: Self-pay | Admitting: Internal Medicine

## 2011-12-17 MED ORDER — BENZONATATE 200 MG PO CAPS: 200.0000 mg | ORAL_CAPSULE | Freq: Two times a day (BID) | ORAL | Status: DC | PRN

## 2011-12-17 MED ORDER — HYDROCODONE-HOMATROPINE 5-1.5 MG/5ML PO SYRP: 5.0000 mL | ORAL_SOLUTION | Freq: Three times a day (TID) | ORAL | Status: DC | PRN

## 2011-12-17 NOTE — Telephone Encounter (Signed)
Advise patient: Continue take Mucinex DM and Tessalon Perles (RF sent) The cough continue, she can take hydrocodone, see prescription. Hydrocodone will cause drowsiness so she needs to be careful or only take it at night. If she continue coughing, call in few days, may need another antibiotic, etc.

## 2011-12-17 NOTE — Telephone Encounter (Signed)
Discussed with pt.  rx sent to pharmacy.  

## 2011-12-17 NOTE — Telephone Encounter (Signed)
Although Dr.Hopper is primary, patient was seen acutely by Dr.Paz. Please advise

## 2011-12-17 NOTE — Telephone Encounter (Signed)
Caller: [name]/Patient; Patient Name: Renee Santos; PCP: Marga Melnick; Best Callback Phone Number: [phone]. Was seen in office 12/10/11 by Dr. Drue Novel, diagnosed with Bronchitis. Placed on Z-Pack, which she has completed. States that she is still coughing. Was told to call back if cough had not gotten a lot better. Still doesn't feel good and cough is still present. Afebrile. Is not coughing up any mucous. Coughing is keeping her awake at night even after taking Tessalon Pearls. Voice is still somewhat hoarse. Would like something else (another antibiotic and something different for cough) called in to [facility] at [facility] for cough. OFFICE PLEASE FOLLOW UP WITH PATIENT REGARDING MEDICATION REQUEST.

## 2011-12-31 ENCOUNTER — Telehealth: Payer: Self-pay | Admitting: Internal Medicine

## 2011-12-31 NOTE — Telephone Encounter (Signed)
Refill: Levothyroxin tab 0.05 mg. Take 1 tablet daily.  90 day supply  Metoprolol er tab suc 50 mg. Take 1/2 tablet daily. 90 day supply

## 2012-01-01 MED ORDER — LEVOTHYROXINE SODIUM 50 MCG PO TABS: ORAL_TABLET | ORAL | Status: DC

## 2012-01-01 MED ORDER — METOPROLOL SUCCINATE ER 50 MG PO TB24: ORAL_TABLET | ORAL | Status: DC

## 2012-01-01 NOTE — Telephone Encounter (Signed)
RX's sent to [facility]

## 2012-01-21 ENCOUNTER — Emergency Department (HOSPITAL_COMMUNITY)
Admission: EM | Admit: 2012-01-21 | Discharge: 2012-01-22 | Disposition: A | Discharge status: Home / Self Care | Payer: Managed Care, Other (non HMO) | Attending: Emergency Medicine | Admitting: Emergency Medicine

## 2012-01-21 ENCOUNTER — Emergency Department (HOSPITAL_COMMUNITY): Payer: Managed Care, Other (non HMO)

## 2012-01-21 ENCOUNTER — Encounter (HOSPITAL_COMMUNITY): Payer: Self-pay | Admitting: Emergency Medicine

## 2012-01-21 DIAGNOSIS — E041 Nontoxic single thyroid nodule: Secondary | ICD-10-CM | POA: Insufficient documentation

## 2012-01-21 DIAGNOSIS — Z7982 Long term (current) use of aspirin: Secondary | ICD-10-CM | POA: Insufficient documentation

## 2012-01-21 DIAGNOSIS — Z8619 Personal history of other infectious and parasitic diseases: Secondary | ICD-10-CM | POA: Insufficient documentation

## 2012-01-21 DIAGNOSIS — R0781 Pleurodynia: Secondary | ICD-10-CM

## 2012-01-21 DIAGNOSIS — E785 Hyperlipidemia, unspecified: Secondary | ICD-10-CM | POA: Insufficient documentation

## 2012-01-21 DIAGNOSIS — I10 Essential (primary) hypertension: Secondary | ICD-10-CM | POA: Insufficient documentation

## 2012-01-21 DIAGNOSIS — Z8522 Personal history of malignant neoplasm of nasal cavities, middle ear, and accessory sinuses: Secondary | ICD-10-CM | POA: Insufficient documentation

## 2012-01-21 DIAGNOSIS — R071 Chest pain on breathing: Secondary | ICD-10-CM | POA: Insufficient documentation

## 2012-01-21 DIAGNOSIS — Z79899 Other long term (current) drug therapy: Secondary | ICD-10-CM | POA: Insufficient documentation

## 2012-01-21 LAB — D-DIMER, QUANTITATIVE: D-Dimer, Quant: 0.29 ug/mL-FEU (ref 0.00–0.48)

## 2012-01-21 LAB — BASIC METABOLIC PANEL
BUN: 18 mg/dL (ref 6–23)
Calcium: 9.7 mg/dL (ref 8.4–10.5)
GFR calc Af Amer: 82 mL/min — ABNORMAL LOW (ref >90)
GFR calc non Af Amer: 71 mL/min — ABNORMAL LOW (ref >90)
Potassium: 3.7 mEq/L (ref 3.5–5.1)
Sodium: 137 mEq/L (ref 135–145)

## 2012-01-21 LAB — CBC
MCHC: 34.5 g/dL (ref 30.0–36.0)
RDW: 11.7 % (ref 11.5–15.5)

## 2012-01-21 LAB — POCT I-STAT TROPONIN I

## 2012-01-21 MED ORDER — KETOROLAC TROMETHAMINE 30 MG/ML IJ SOLN
30.0000 mg | Freq: Once | INTRAMUSCULAR | Status: DC
  Filled 2012-01-21: qty 1

## 2012-01-21 MED ORDER — KETOROLAC TROMETHAMINE 60 MG/2ML IM SOLN
60.0000 mg | Freq: Once | INTRAMUSCULAR | Status: AC
  Administered 2012-01-21: 60 mg via INTRAMUSCULAR

## 2012-01-21 NOTE — ED Notes (Signed)
PA at bedside.

## 2012-01-21 NOTE — ED Provider Notes (Addendum)
History     CSN: [id number]  Arrival date & time 01/21/12  7829   First MD Initiated Contact with Patient 01/21/12 2107      Chief Complaint  Patient presents with  . Chest Pain    (Consider location/radiation/quality/duration/timing/severity/associated sxs/prior treatment) HPI Comments: 66 year old female presents to the emergency department complaining of sudden onset right chest pain while sitting in a meeting at work around 3:30 this afternoon. Describes the pain as sharp, only present when she takes a deep breath rated 9/10. Admits to associated dry cough. No nausea, vomiting or diaphoresis. She has never had chest pain like this in the past. Denies shortness of breath. Admits to associated palpitations. She is on exogenous estrogen patches. No history of blood clots. No family history of early heart disease. Patient is a nonsmoker. Denies any recent long car rides or plane rides.  Patient is a 66 y.o. female presenting with chest pain. The history is provided by the patient and the spouse.  Chest Pain     Past Medical History  Diagnosis Date  . Thyroid nodule   . Hyperlipidemia     due to very high HDL  . Herpes zoster 2008    posterior thorax  . Neurofibroma of back 2013     Dr Amy [place]il[place]   . Cancer 2011    Basal Cell of nose; Dr [place]  . Hypertension     Past Surgical History  Procedure Date  . Colonoscopy w/ polypectomy 2007  . Tonsillectomy   . Appendectomy   . Oophorectomy     LSO for cyst  . Abdominal hysterectomy      LSO  & appendectomy for endometrioma   @ age38 for ovarian cyst    Family History  Problem Relation Age of Onset  . Hypertension Father   . Lung cancer Father     non smoker  . Hyperlipidemia Mother     CABG in late 47s  . Hypothyroidism Mother   . Macular degeneration Mother   . Hypertension Mother   . Coronary artery disease Mother     bypass surgery  . Coronary artery disease Maternal Grandmother   . Diabetes Neg Hx   .  Stroke Neg Hx     History  Substance Use Topics  . Smoking status: Never Smoker   . Smokeless tobacco: Not on file  . Alcohol Use: 0.0 oz/week     Comment: occasion    OB History    Grav Para Term Preterm Abortions TAB SAB Ect Mult Living   2 2        2       Review of Systems  Cardiovascular: Positive for chest pain.  All other systems reviewed and are negative.    Allergies  Review of patient's allergies indicates no known allergies.  Home Medications   Current Outpatient Rx  Name  Route  Sig  Dispense  Refill  . ASPIRIN 81 MG PO TABS   Oral   Take 81 mg by mouth 3 (three) times a week.          Marland Kitchen CALTRATE PLUS PO   Oral   Take by mouth daily.           Marland Kitchen DIPHENHYDRAMINE HCL 25 MG PO TABS   Oral   Take 25 mg by mouth at bedtime as needed. To help sleep         . ESTRADIOL 0.05 MG/24HR TD PTTW   Transdermal   Place 1  patch (0.05 mg total) onto the skin 2 (two) times a week.   24 patch   4   . FOLIC ACID 1 MG PO TABS   Oral   Take 1 mg by mouth daily.           Marland Kitchen HYDROCHLOROTHIAZIDE 25 MG PO TABS      TAKE 1/2 TABLET DAILY   45 tablet   1   . LEVOTHYROXINE SODIUM 50 MCG PO TABS   Oral   Take 50-75 mcg by mouth daily. Takes 1 tablet daily, except 1.5 tablets on tuesdays and thursdays         . METOPROLOL SUCCINATE ER 50 MG PO TB24   Oral   Take 25 mg by mouth daily. Take with or immediately following a meal.         . NAPROXEN SODIUM 220 MG PO TABS   Oral   Take 220 mg by mouth daily as needed. For knee pain           BP 128/70  Pulse 81  Temp 99.6 F (37.6 C) (Oral)  Resp 18  SpO2 99%  Physical Exam  Nursing note and vitals reviewed. Constitutional: She is oriented to person, place, and time. She appears well-developed and well-nourished. No distress.  HENT:  Head: Normocephalic and atraumatic.  Mouth/Throat: Oropharynx is clear and moist.  Eyes: Conjunctivae normal and EOM are normal. Pupils are equal, round, and  reactive to light.  Neck: Normal range of motion. Neck supple.  Cardiovascular: Normal rate, regular rhythm, normal heart sounds and intact distal pulses.   Pulmonary/Chest: Effort normal and breath sounds normal. She exhibits no tenderness.  Abdominal: Soft. Bowel sounds are normal. There is no tenderness.  Musculoskeletal: Normal range of motion. She exhibits no edema.  Neurological: She is alert and oriented to person, place, and time.  Skin: Skin is warm and dry.  Psychiatric: She has a normal mood and affect. Her behavior is normal.    ED Course  Procedures (including critical care time)  Labs Reviewed  CBC - Abnormal; Notable for the following:    WBC 10.8 (*)     All other components within normal limits  BASIC METABOLIC PANEL - Abnormal; Notable for the following:    Glucose, Bld 151 (*)     GFR calc non Af Amer 71 (*)     GFR calc Af Amer 82 (*)     All other components within normal limits  POCT I-STAT TROPONIN I  D-DIMER, QUANTITATIVE   Dg Chest 2 View  01/21/2012  *RADIOLOGY REPORT*  Clinical Data: Left breast pain.  CHEST - 2 VIEW  Comparison: 07/18/2008.  Findings:  Cardiopericardial silhouette within normal limits. Mediastinal contours normal. Trachea midline.  No airspace disease or effusion.  Translucent monitoring buttons are projected over the chest.  IMPRESSION: No active cardiopulmonary disease.   Original Report Authenticated By: Andreas Newport, M.D.     Date: 01/21/2012  Rate: 91  Rhythm: normal sinus rhythm  QRS Axis: normal  Intervals: normal  ST/T Wave abnormalities: normal  Conduction Disutrbances:none  Narrative Interpretation: normal EKG  Old EKG Reviewed: unchanged    1. Pleuritic chest pain       MDM  66 y/o female with pleuritic chest pain. EKG, CXR, labs unremarkable. D-dimer obtained due to pleuritic symptoms, age and exogenous estrogen. Pain beginning to improve with toradol. I do not feel her chest pain is cardiac. Case discussed  with Dr. Effie Shy who agrees with pleurisy  diagnosis. Patient comfortable and stable for discharge. Return precautions discussed in detail with patient and husband. Advised her to increase her naproxen to twice daily. She normally takes this once daily for knee problems. Tylenol # 3 given for severe pain. She will follow up with Dr. Alwyn Ren.        Trevor Mace, PA-C 01/22/12 0011  Trevor Mace, PA-C 02/18/12 2123

## 2012-01-21 NOTE — ED Notes (Signed)
Pt. Reports chest pain starting this afternoon. States sharp pain in right chest, reports worse when she takes a deep breath or lying flat. Pt. Denies SOB, N/V, diaphoresis.

## 2012-01-21 NOTE — ED Notes (Signed)
Reports about 1530, reports having R CP, denies SOB, denies radiation; does report it hurts worse with deep breath, pt does report having cough, denies n/v

## 2012-01-22 MED ORDER — ACETAMINOPHEN-CODEINE #3 300-30 MG PO TABS: 1.0000 | ORAL_TABLET | Freq: Four times a day (QID) | ORAL | Status: DC | PRN

## 2012-01-22 NOTE — ED Provider Notes (Signed)
Medical screening examination/treatment/procedure(s) were performed by non-physician practitioner and as supervising physician I was immediately available for consultation/collaboration.   L , MD 01/22/12 0103 

## 2012-01-27 ENCOUNTER — Encounter: Payer: Self-pay | Admitting: Internal Medicine

## 2012-01-27 ENCOUNTER — Ambulatory Visit (INDEPENDENT_AMBULATORY_CARE_PROVIDER_SITE_OTHER): Payer: Managed Care, Other (non HMO) | Admitting: Internal Medicine

## 2012-01-27 VITALS — BP 128/80 | HR 76 | Temp 98.3°F | Wt 110.6 lb

## 2012-01-27 DIAGNOSIS — J209 Acute bronchitis, unspecified: Secondary | ICD-10-CM

## 2012-01-27 DIAGNOSIS — R0781 Pleurodynia: Secondary | ICD-10-CM

## 2012-01-27 DIAGNOSIS — R071 Chest pain on breathing: Secondary | ICD-10-CM

## 2012-01-27 MED ORDER — AMOXICILLIN-POT CLAVULANATE 875-125 MG PO TABS: 1.0000 | ORAL_TABLET | Freq: Two times a day (BID) | ORAL | Status: DC

## 2012-01-27 NOTE — Patient Instructions (Addendum)
If you activate My Chart; the results can be released to you as soon as they populate from the lab. If you choose not to use this program; the labs have to be reviewed, copied & mailed   causing a delay in getting the results to you. 

## 2012-01-27 NOTE — Progress Notes (Signed)
  Subjective:    Patient ID: Renee Santos, female    DOB: Mar 02, 1945, 67 y.o.   MRN: [id number]  HPI  The emergency room records 01/21/12 were reviewed. She was diagnosed with right-sided pleuritic chest pain for which she continued to take nonsteroidals. The symptoms resolved by 12/13. Labs and x-rays were reviewed. Her GFR was minimally reduced at 71. White count was mildly elevated at 10,800. Troponin and d-dimer were normal.    Review of Systems At this time she denies any chest pain, palpitations, or dyspnea. She continues to have intermittent cough with some yellow sputum.  She denies any sinus congestion or postnasal drainage. She also has no reflux symptoms.  She is not on ACE inhibitor. She has never smoked and has no history of asthma.     Objective:   Physical Exam General appearance:good health ;well nourished; no acute distress or increased work of breathing is present.  No  lymphadenopathy about the head, neck, or axilla noted.   Eyes: No conjunctival inflammation or lid edema is present.   Ears:  External ear exam shows no significant lesions or deformities.  Otoscopic examination reveals clear canals, tympanic membranes are intact bilaterally without bulging, retraction, inflammation or discharge.  Nose:  External nasal examination shows no deformity or inflammation. Nasal mucosa are pink and moist without lesions or exudates. No septal dislocation or deviation.No obstruction to airflow.   Oral exam: Dental hygiene is good; lips and gums are healthy appearing.There is no oropharyngeal erythema or exudate noted.   Neck:  No deformities,  masses, or tenderness noted.      Heart:  Normal rate and regular rhythm. S1 and S2 normal without gallop, murmur, click, rub or other extra sounds.   Lungs:Chest clear to auscultation; no wheezes, rhonchi,rales ,or rubs present.No increased work of breathing.    Extremities:  No cyanosis or clubbing  noted . Homan's  negative   Skin: Warm & dry            Assessment & Plan:  #1 right-sided pleuritic chest pain most likely costochondritis related to protracted cough #2 protracted bronchitis w/o bronchospasm Plan: See orders and recommendations

## 2012-02-19 NOTE — ED Provider Notes (Signed)
Medical screening examination/treatment/procedure(s) were performed by non-physician practitioner and as supervising physician I was immediately available for consultation/collaboration.   Flint Melter, MD 02/19/12 [phone]

## 2012-03-09 ENCOUNTER — Ambulatory Visit (INDEPENDENT_AMBULATORY_CARE_PROVIDER_SITE_OTHER): Payer: Managed Care, Other (non HMO) | Admitting: Internal Medicine

## 2012-03-09 ENCOUNTER — Encounter: Payer: Self-pay | Admitting: Internal Medicine

## 2012-03-09 VITALS — BP 122/82 | HR 70 | Temp 98.0°F | Wt 110.0 lb

## 2012-03-09 DIAGNOSIS — R042 Hemoptysis: Secondary | ICD-10-CM

## 2012-03-09 MED ORDER — DOXYCYCLINE HYCLATE 100 MG PO TABS: 100.0000 mg | ORAL_TABLET | Freq: Two times a day (BID) | ORAL | Status: DC

## 2012-03-09 NOTE — Patient Instructions (Addendum)
Please get your x-ray at the other [facility]  office located at: [address] [place], across from [facility].  Please go to the basement, this is a walk-in facility, they are open from 8:30 to 5:30 PM. Phone number [phone]. ---  Rest, fluids , tylenol For cough, take Mucinex DM twice a day as needed  If the cough continue at night, take one of the codeine tablet  Symbicort 2 puffs twice a day (sample) Take the antibiotic as prescribed  (doxycycline) Call if no better in few days Call anytime if the symptoms are severe, you have high fever, short of breath, chest pain or more blood in the sputum Please see Dr. Alwyn Ren in 2 weeks

## 2012-03-09 NOTE — Progress Notes (Signed)
  Subjective:    Patient ID: Renee Santos, female    DOB: July 17, 1945, 67 y.o.   MRN: [id number]  HPI Acute visit She came back from [place], a 40-hour trip, about a week ago, shortly after developed cough and fever. The first few times she cough noted some blood in the sputum, she thinks blood was actually coming from the nose discharge. Currently the sputum is clear. She has noted some wheezing as well and is here for evaluation.  Past Medical History  Diagnosis Date  . Thyroid nodule   . Hyperlipidemia     due to very high HDL  . Herpes zoster 2008    posterior thorax  . Neurofibroma of back 2013     Dr Amy [place]   . Cancer 2011    Basal Cell of nose; Dr [place]  . Hypertension   . Pleurisy without effusion 01/21/2012    resolved  with NSAIDS   Past Surgical History  Procedure Date  . Colonoscopy w/ polypectomy 2007  . Tonsillectomy   . Appendectomy   . Oophorectomy     LSO for cyst  . Abdominal hysterectomy      LSO  & appendectomy for endometrioma   @ age38 for ovarian cyst     Review of Systems No nausea, vomiting, diarrhea. No lower extremity edema or calf pain. No chest pain or shortness of breath. Sinuses slightly congested but not severely.    Objective:   Physical Exam  General -- alert, well-developed, and well-nourished. Vital signs are stable, frequent cough noted. HEENT -- TMs normal, throat w/o redness, face symmetric and not tender to palpation  Lungs -- normal respiratory effort, no intercostal retractions, no accessory muscle use, mild large airway congestion, prolonged expiratory time ;no wheezing. Heart-- normal rate, regular rhythm, no murmur, and no gallop.    Extremities-- no pretibial edema bilaterally, calves symmetric, no tender   Psych-- Cognition and judgment appear intact. Alert and cooperative with normal attention span and concentration.  not anxious appearing and not depressed appearing.      Assessment & Plan:   One-week history  of cough, question of  Initial hemoptysis . Symptoms likely due to a respiratory infection however this is happening in the setting of recent prolonged airplane trip. Plan: Chest x-ray, doxycycline, cough control w/ codeine, start Symbicort, see instructions. Recommend to see PCP in 2 weeks, this is the third visit in 3 months for cough and respiratory symptoms. We'll get a stat d-dimer, if it is positive, she will be directed to the ER for further evaluation to rule out PE.

## 2012-03-10 ENCOUNTER — Ambulatory Visit (INDEPENDENT_AMBULATORY_CARE_PROVIDER_SITE_OTHER)
Admission: RE | Admit: 2012-03-10 | Discharge: 2012-03-10 | Disposition: A | Discharge status: Home / Self Care | Payer: Managed Care, Other (non HMO) | Source: Ambulatory Visit | Attending: Internal Medicine | Admitting: Internal Medicine

## 2012-03-10 ENCOUNTER — Ambulatory Visit (HOSPITAL_COMMUNITY)
Admission: RE | Admit: 2012-03-10 | Discharge: 2012-03-10 | Disposition: A | Discharge status: Home / Self Care | Payer: Managed Care, Other (non HMO) | Source: Ambulatory Visit | Attending: Internal Medicine | Admitting: Internal Medicine

## 2012-03-10 ENCOUNTER — Telehealth: Payer: Self-pay | Admitting: Internal Medicine

## 2012-03-10 DIAGNOSIS — R042 Hemoptysis: Secondary | ICD-10-CM | POA: Insufficient documentation

## 2012-03-10 DIAGNOSIS — I319 Disease of pericardium, unspecified: Secondary | ICD-10-CM | POA: Insufficient documentation

## 2012-03-10 DIAGNOSIS — R0602 Shortness of breath: Secondary | ICD-10-CM | POA: Insufficient documentation

## 2012-03-10 DIAGNOSIS — I313 Pericardial effusion (noninflammatory): Secondary | ICD-10-CM

## 2012-03-10 DIAGNOSIS — R599 Enlarged lymph nodes, unspecified: Secondary | ICD-10-CM | POA: Insufficient documentation

## 2012-03-10 LAB — CBC WITH DIFFERENTIAL/PLATELET
Basophils Absolute: 0.1 10*3/uL (ref 0.0–0.1)
Eosinophils Absolute: 0 10*3/uL (ref 0.0–0.7)
Hemoglobin: 13.5 g/dL (ref 12.0–15.0)
Lymphocytes Relative: 10.6 % — ABNORMAL LOW (ref 12.0–46.0)
Lymphs Abs: 1.4 10*3/uL (ref 0.7–4.0)
MCHC: 33.8 g/dL (ref 30.0–36.0)
MCV: 94.1 fl (ref 78.0–100.0)
Monocytes Absolute: 0.3 10*3/uL (ref 0.1–1.0)
Neutro Abs: 11 10*3/uL — ABNORMAL HIGH (ref 1.4–7.7)
RDW: 12.9 % (ref 11.5–14.6)

## 2012-03-10 LAB — BASIC METABOLIC PANEL
BUN: 16 mg/dL (ref 6–23)
Calcium: 9.8 mg/dL (ref 8.4–10.5)
Chloride: 99 mEq/L (ref 96–112)
Creatinine, Ser: 0.9 mg/dL (ref 0.4–1.2)
GFR: 65.15 mL/min (ref >60.00)

## 2012-03-10 MED ORDER — IOHEXOL 350 MG/ML SOLN
100.0000 mL | Freq: Once | INTRAVENOUS | Status: AC | PRN
  Administered 2012-03-10: 100 mL via INTRAVENOUS

## 2012-03-10 MED ORDER — MOXIFLOXACIN HCL 400 MG PO TABS: 400.0000 mg | ORAL_TABLET | Freq: Every day | ORAL | Status: DC

## 2012-03-10 NOTE — Telephone Encounter (Signed)
CT: Patchy streaky and tree in bud opacities within the right upper and right lower lobes - most likely representing infection and / or hemorrhage. Other inflammatory processes are not excluded.Clinical/ imaging follow-up recommended.  Small to moderate pericardial effusion.  No PE Impression: inflammatory process likely represents PNM. Also has a pericardial effusion, will discuss w/ cards, ECHO?Marland Kitchen Will change doxy to avelox x 1 week, Rx sent. Detail message left for the pt tonight

## 2012-03-11 ENCOUNTER — Encounter: Payer: Self-pay | Admitting: Internal Medicine

## 2012-03-11 NOTE — Telephone Encounter (Signed)
I talked to the patient last night, she is aware the plan. Today I talk with Dr. Ladona Ridgel, cardiology, he recommended a echocardiogram. Advice patient we are  Getting a echocardiogram. Please arrange office if it with Dr. Alwyn Ren in 2 weeks.

## 2012-03-11 NOTE — Telephone Encounter (Signed)
Discussed with pt.  Scheduled OV with Dr. Alwyn Ren.

## 2012-03-12 ENCOUNTER — Telehealth: Payer: Self-pay | Admitting: Internal Medicine

## 2012-03-12 ENCOUNTER — Telehealth: Payer: Self-pay | Admitting: *Deleted

## 2012-03-12 ENCOUNTER — Ambulatory Visit (HOSPITAL_COMMUNITY): Payer: Managed Care, Other (non HMO) | Attending: Internal Medicine | Admitting: Radiology

## 2012-03-12 DIAGNOSIS — Z8249 Family history of ischemic heart disease and other diseases of the circulatory system: Secondary | ICD-10-CM | POA: Insufficient documentation

## 2012-03-12 DIAGNOSIS — I10 Essential (primary) hypertension: Secondary | ICD-10-CM | POA: Insufficient documentation

## 2012-03-12 DIAGNOSIS — I059 Rheumatic mitral valve disease, unspecified: Secondary | ICD-10-CM | POA: Insufficient documentation

## 2012-03-12 DIAGNOSIS — E785 Hyperlipidemia, unspecified: Secondary | ICD-10-CM | POA: Insufficient documentation

## 2012-03-12 DIAGNOSIS — I313 Pericardial effusion (noninflammatory): Secondary | ICD-10-CM

## 2012-03-12 DIAGNOSIS — I319 Disease of pericardium, unspecified: Secondary | ICD-10-CM | POA: Insufficient documentation

## 2012-03-12 DIAGNOSIS — I079 Rheumatic tricuspid valve disease, unspecified: Secondary | ICD-10-CM | POA: Insufficient documentation

## 2012-03-12 NOTE — Telephone Encounter (Signed)
Patient left message on triage line wanting test results from CT. Advised pt that CT showed no evidence of pulmonary emboli.

## 2012-03-12 NOTE — Progress Notes (Signed)
Echocardiogram performed.  

## 2012-03-12 NOTE — Telephone Encounter (Signed)
Noted, patient was given clarification on stopping Doxy and starting Avelox per Dr.Paz

## 2012-03-12 NOTE — Telephone Encounter (Signed)
[facility] Triage Call Report Triage Record Num: [id number] Operator: Patriciaann Clan Patient Name: Renee Santos Call Date & Time: 03/11/2012 7:43:05PM Patient Phone: [phone] PCP: Marga Melnick Patient Gender: Female PCP Fax : [phone] Patient DOB: [birth year] Practice Name: [facility] Reason for Call: Caller: Robert/Spouse; PCP: Marga Melnick; CB#: [phone]; Call regarding Med ?; Spouse states patietn was seen in office 03/09/12. States she was prescribed Doxycycline for Upper Respiratory infection. Spouse states hereceived a call 03/10/12 that Patient's Chest Xray revealed Pneumonia and she was going to be prescribed Avelox. Spouse states he thought he was advised to give both Doxycycline and Avelox. Spouse states when he obtained the medication form the [facility] Pharmacist advisd him that patient should not take both medications. Spouse is calling to verify medication instructions. RN reviewed Research officer, political party record. Documentation noted, per dr Willow Ora on 03/10/12 2006, " Will change Doxy to Avelox x 1 week, Rx sent." Spouse informed of above. Spouse advised for patient to discontinue Doxycycline and take Avelox as prescribed. Spouse verbalizes understanding of medication instructions. Protocol(s) Used: Office Note Recommended Outcome per Protocol: Information Noted and Sent to Office Reason for Outcome: Caller information to office Care Advice: ~

## 2012-03-16 ENCOUNTER — Telehealth: Payer: Self-pay | Admitting: Internal Medicine

## 2012-03-16 NOTE — Telephone Encounter (Signed)
Patient Information:  Caller Name: Laya  Phone: [phone]  Patient: Renee Santos, Renee Santos  Gender: Female  DOB: 1946-02-05  Age: 67 Years  PCP: Marga Melnick  Office Follow Up:  Does the office need to follow up with this patient?: Yes  Instructions For The Office: OFFICE FOLLOW UP REQUEST -PT WANTING TO KNOW ECHO RESULTS  AND ALSO THAT SHE HAS HAD DULL HEADACHES  RN Note:  Pt rating dull headache  4/10 during call. She took  Advil 400 mg last at 1200 noon today , but not much relief. RN discussed care advice with pt. Pt is wanting to know about echo results and  wants Dr. Alwyn Ren to be made aware of her progress, and if she needs OV before her FU 03/23/2012.  Symptoms  Reason For Call & Symptoms: Pt stating she had OV 03/09/2012  and diagnosed with Pneumonia . She is wanting to know the results of Echo cardiogram that she was sent for. She is almost finished with antibitoic and does feel better.  No  fever and cough has almost  stopped. She has not seen   anymore blood tinged sputum.  Pt comments that she  has had daily  dull headache "behind eyes" and maybe some forehead since 03/13/2012. Also has some nasal discharge.  Reviewed Health History In EMR: Yes  Reviewed Medications In EMR: Yes  Reviewed Allergies In EMR: Yes  Reviewed Surgeries / Procedures: Yes  Date of Onset of Symptoms: 03/13/2012  Treatments Tried: Advil  Treatments Tried Worked: Yes  Guideline(s) Used:  Sinus Pain and Congestion  Disposition Per Guideline:   Home Care  Reason For Disposition Reached:   Sinus congestion as part of a cold, present < 10 days  Advice Given:  For a Stuffy Nose - Use Nasal Washes:  Introduction: Saline (salt water) nasal irrigation (nasal wash) is an effective and simple home remedy for treating stuffy nose and sinus congestion. The nose can be irrigated by pouring, spraying, or squirting salt water into the nose and then letting it run back out.  Pain and Fever Medicines:  For pain  or fever relief, take either acetaminophen or ibuprofen.  Hydration:  Drink plenty of liquids (6-8 glasses of water daily). If the air in your home is dry, use a cool mist humidifier  Call Back If:   Severe pain lasts longer than 2 hours after pain medicine  Sinus congestion (fullness) lasts longer than 10 days  You become worse.

## 2012-03-16 NOTE — Telephone Encounter (Signed)
lmovm for pt to return call.  

## 2012-03-20 ENCOUNTER — Telehealth: Payer: Self-pay | Admitting: Internal Medicine

## 2012-03-20 NOTE — Telephone Encounter (Signed)
Discussed with Renee Santos. She states that she doesn't feel like she needs a CT. Renee Santos stated she would like to wait & see Dr. Alwyn Ren on 2.10.14.

## 2012-03-20 NOTE — Telephone Encounter (Signed)
Patient reported earlier this week that she continue with headache. Please call patient, if she is still having headaches or if HA is intense or "worse of her life" ---> will schedule a CT of the head. Let me know

## 2012-03-23 ENCOUNTER — Other Ambulatory Visit: Payer: Self-pay | Admitting: Internal Medicine

## 2012-03-23 ENCOUNTER — Ambulatory Visit (INDEPENDENT_AMBULATORY_CARE_PROVIDER_SITE_OTHER): Payer: Managed Care, Other (non HMO) | Admitting: Internal Medicine

## 2012-03-23 ENCOUNTER — Encounter: Payer: Self-pay | Admitting: Internal Medicine

## 2012-03-23 VITALS — BP 130/78 | HR 69 | Temp 97.6°F | Wt 114.0 lb

## 2012-03-23 DIAGNOSIS — J683 Other acute and subacute respiratory conditions due to chemicals, gases, fumes and vapors: Secondary | ICD-10-CM

## 2012-03-23 DIAGNOSIS — R05 Cough: Secondary | ICD-10-CM

## 2012-03-23 DIAGNOSIS — J45909 Unspecified asthma, uncomplicated: Secondary | ICD-10-CM

## 2012-03-23 MED ORDER — ALBUTEROL SULFATE HFA 108 (90 BASE) MCG/ACT IN AERS: 2.0000 | INHALATION_SPRAY | RESPIRATORY_TRACT | Status: DC | PRN

## 2012-03-23 MED ORDER — MONTELUKAST SODIUM 10 MG PO TABS: 10.0000 mg | ORAL_TABLET | Freq: Every day | ORAL | Status: DC

## 2012-03-23 MED ORDER — PREDNISONE 20 MG PO TABS: ORAL_TABLET | ORAL | Status: DC

## 2012-03-23 NOTE — Progress Notes (Signed)
  Subjective:    Patient ID: Renee Santos, female    DOB: Feb 01, 1946, 67 y.o.   MRN: [id number]  HPI  Her symptoms exacerbated in flight returning from [place] in January. At that time she was having fever and some purulent sputum. She had scant hemoptysis which he thought might have a sinus source. There was no significant hemoptysis.  The chest x-ray and CT scan 03/10/12 were reviewed. Chest x-ray shows a minor granuloma. The CT scan shows patchy bronchiectatic type changes in the right upper and right lower lobe. There is no segmental consolidation.  The cough has returned after finishing Prednisone &  Avelox; this time it is dry. She has no associated purulent secretions from her head or chest. There has been no associated wheezing or shortness of breath. She has not returned walking because of malaise related to the present illness.  She has never smoked and has no history of asthma. There is a strong family history of asthma in the maternal family involving 2 maternal uncles and her maternal grandfather.       Review of Systems She discomfort she denies associated extrinsic symptoms of itchy, watery eyes, or sneezing. She's had no persistent or significant frontal headache, facial pain, or nasal purulence.     Objective:   Physical Exam General appearance:good health ;well nourished; no acute distress or increased work of breathing is present.  No  lymphadenopathy about the head, neck, or axilla noted.   Eyes: No conjunctival inflammation or lid edema is present.   Ears:  External ear exam shows no significant lesions or deformities.  Otoscopic examination reveals clear canals, tympanic membranes are intact bilaterally without bulging, retraction, inflammation or discharge.  Nose:  External nasal examination shows no deformity or inflammation. Nasal mucosa are pink and moist without lesions or exudates. No septal dislocation or deviation.No obstruction to airflow.   Oral exam:  Dental hygiene is good; lips and gums are healthy appearing.There is no oropharyngeal erythema or exudate noted.   Neck:  No deformities,  masses, or tenderness noted.    Heart:  Normal rate and regular rhythm. S1 and S2 normal without gallop, murmur, click, rub or other extra sounds.   Lungs:Chest clear to auscultation; no wheezes, rhonchi,rales ,or rubs present.No increased work of breathing. She has a brassy, upper airway cough. No wheezing was noted in the right upper lobe or right lower lobe at the sites of the bronchiectatic changes on CT.  Extremities:  No cyanosis, edema, or clubbing  noted    Skin: Warm & dry .         Assessment & Plan:  #1 protracted cough in the context of respiratory tract infections. At this time clinically there is no evidence of active infection but she is left with a reactive airways component.  Plan: Symbicort should be continued every 12 hours. Albuterol every 4 hours as needed to be initiated. Generic Singulair will be added. PFTs will be scheduled to assess reactive airways component.

## 2012-03-23 NOTE — Telephone Encounter (Signed)
Hopp please advise  

## 2012-03-23 NOTE — Telephone Encounter (Signed)
I gave her a prescription for 20 mg pills, #20. She is to take it twice a day for 5 days then once daily for 5 days then a half a pill daily until completed.

## 2012-03-23 NOTE — Patient Instructions (Addendum)
Prednisone one pill twice daily with meals x5 days; then one pill daily for 5 days; and then one half pill daily until the entire prescription completed.

## 2012-03-26 ENCOUNTER — Other Ambulatory Visit: Payer: Self-pay | Admitting: Internal Medicine

## 2012-04-22 ENCOUNTER — Ambulatory Visit (INDEPENDENT_AMBULATORY_CARE_PROVIDER_SITE_OTHER): Payer: Medicare Other | Admitting: Internal Medicine

## 2012-04-22 DIAGNOSIS — R0602 Shortness of breath: Secondary | ICD-10-CM

## 2012-04-22 LAB — PULMONARY FUNCTION TEST

## 2012-04-22 NOTE — Progress Notes (Signed)
PFT done today. 

## 2012-05-06 ENCOUNTER — Encounter: Payer: Self-pay | Admitting: Internal Medicine

## 2012-05-21 ENCOUNTER — Telehealth: Payer: Self-pay | Admitting: Internal Medicine

## 2012-05-21 MED ORDER — LEVOTHYROXINE SODIUM 50 MCG PO TABS: ORAL_TABLET | ORAL | Status: DC

## 2012-05-21 MED ORDER — METOPROLOL SUCCINATE ER 50 MG PO TB24: ORAL_TABLET | ORAL | Status: DC

## 2012-05-21 MED ORDER — HYDROCHLOROTHIAZIDE 25 MG PO TABS: ORAL_TABLET | ORAL | Status: DC

## 2012-05-21 NOTE — Telephone Encounter (Signed)
RXs sent.

## 2012-05-21 NOTE — Telephone Encounter (Signed)
HYDROCHLOROTHIAZIDE TABLET 25MG   QTY: 90 1/23 PO QD   METOPROLOLER TABLET 50MG  QTY 90 1/2 PO QD,   LEVOTHYROXINE TABLET 0.05MG  ( ) QTY: 90 1 PO QD EXCEPT ON TUES AND THUR

## 2012-09-16 ENCOUNTER — Other Ambulatory Visit: Payer: Self-pay

## 2012-09-24 ENCOUNTER — Ambulatory Visit (INDEPENDENT_AMBULATORY_CARE_PROVIDER_SITE_OTHER): Payer: Medicare Other | Admitting: Gynecology

## 2012-09-24 ENCOUNTER — Encounter: Payer: Managed Care, Other (non HMO) | Admitting: Gynecology

## 2012-09-24 ENCOUNTER — Encounter: Payer: Self-pay | Admitting: Gynecology

## 2012-09-24 VITALS — BP 120/76 | Ht 63.0 in | Wt 110.0 lb

## 2012-09-24 DIAGNOSIS — M899 Disorder of bone, unspecified: Secondary | ICD-10-CM

## 2012-09-24 DIAGNOSIS — N952 Postmenopausal atrophic vaginitis: Secondary | ICD-10-CM

## 2012-09-24 DIAGNOSIS — Z79890 Hormone replacement therapy (postmenopausal): Secondary | ICD-10-CM

## 2012-09-24 DIAGNOSIS — M8580 Other specified disorders of bone density and structure, unspecified site: Secondary | ICD-10-CM

## 2012-09-24 MED ORDER — ESTRADIOL 0.05 MG/24HR TD PTTW: 1.0000 | MEDICATED_PATCH | TRANSDERMAL | Status: DC

## 2012-09-24 NOTE — Progress Notes (Signed)
Renee Santos January 06, 1946 [id number]        67 y.o.  G2P2 for followup exam.  Several issues noted below.  Past medical history,surgical history, medications, allergies, family history and social history were all reviewed and documented in the EPIC chart.  ROS:  Performed and pertinent positives and negatives are included in the history, assessment and plan .  Exam: Kim assistant Filed Vitals:   09/24/12 1505  BP: 120/76  Height: 5\' 3"  (1.6 m)  Weight: 110 lb (49.896 kg)   General appearance  Normal Skin grossly normal Head/Neck normal with no cervical or supraclavicular adenopathy thyroid normal Lungs  clear Cardiac RR, without RMG Abdominal  soft, nontender, without masses, organomegaly or hernia Breasts  examined lying and sitting without masses, retractions, discharge or axillary adenopathy. Pelvic  Ext/BUS/vagina  normal with atrophic changes   Adnexa  Without masses or tenderness    Anus and perineum  normal   Rectovaginal  normal sphincter tone without palpated masses or tenderness.    Assessment/Plan:  67 y.o. G2P2 female for followup exam, status post status post TAH LSO for endometriosis.  1. HRT. Patient continues on Vivelle 0.05 mg patches. He is doing well and wants to continue. Notes hot flushes if she forgets to change her patch.  I again reviewed the whole issue of HRT with her to include the WHI study with increased risk of stroke, heart attack, DVT and breast cancer. The ACOG and NAMS statements for lowest dose for the shortest period of time reviewed. Transdermal versus oral first-pass effect benefit discussed. Patient understands accepts risks and I refilled her x1 year 2. Pap smear 2012. No Pap smear done today. No history of significant abnormal Pap smears previously. Status post hysterectomy for benign indications. Age 36. Options to stop screening altogether or less frequent screening intervals reviewed. We'll readdress on annual basis. 3. Osteopenia reported  from her DEXA 2012. I do not have a copy of this report but she was told not significant enough to treat. Is due to repeat this year and will do so. I asked her to send a report to me so I can look at. Increase calcium vitamin D reviewed. 4. Mammography 12/2011. Repeat this coming November. SBE monthly reviewed. 5. Colonoscopy 7 years ago. Was told to repeat at 10 year interval. 6. Health maintenance. No blood work done today as it's all gone to Dr. Frederik Pear office. Followup one year, sooner as needed.   Note: This document was prepared with digital dictation and possible smart phrase technology. Any transcriptional errors that result from this process are unintentional.   [name] MD, 3:28 PM 09/24/2012

## 2012-09-24 NOTE — Patient Instructions (Signed)
Followup in one year, sooner as needed. Have a copy of your bone density sent to my office.

## 2012-09-25 LAB — URINALYSIS W MICROSCOPIC + REFLEX CULTURE
Bacteria, UA: NONE SEEN
Bilirubin Urine: NEGATIVE
Casts: NONE SEEN
Crystals: NONE SEEN
Glucose, UA: NEGATIVE mg/dL
Hgb urine dipstick: NEGATIVE
Ketones, ur: NEGATIVE mg/dL
Specific Gravity, Urine: 1.01 (ref 1.005–1.030)
pH: 6.5 (ref 5.0–8.0)

## 2012-10-14 ENCOUNTER — Telehealth: Payer: Self-pay | Admitting: *Deleted

## 2012-10-14 MED ORDER — ESTRADIOL 0.05 MG/24HR TD PTTW: 1.0000 | MEDICATED_PATCH | TRANSDERMAL | Status: DC

## 2012-10-14 NOTE — Telephone Encounter (Signed)
Pt never received her minivelle patch 0.5 from OV 09/24/12. Rx was sent on print rather than normal. I told pt I will sent Rx today for her.

## 2012-11-26 ENCOUNTER — Other Ambulatory Visit: Payer: Self-pay | Admitting: Dermatology

## 2012-12-03 ENCOUNTER — Ambulatory Visit (INDEPENDENT_AMBULATORY_CARE_PROVIDER_SITE_OTHER): Payer: Medicare Other | Admitting: General Practice

## 2012-12-03 DIAGNOSIS — Z23 Encounter for immunization: Secondary | ICD-10-CM

## 2012-12-21 ENCOUNTER — Other Ambulatory Visit: Payer: Self-pay | Admitting: *Deleted

## 2012-12-21 MED ORDER — METOPROLOL SUCCINATE ER 50 MG PO TB24: ORAL_TABLET | ORAL | Status: DC

## 2012-12-21 MED ORDER — LEVOTHYROXINE SODIUM 50 MCG PO TABS: ORAL_TABLET | ORAL | Status: DC

## 2012-12-21 MED ORDER — HYDROCHLOROTHIAZIDE 25 MG PO TABS: ORAL_TABLET | ORAL | Status: DC

## 2012-12-21 NOTE — Telephone Encounter (Signed)
Levothyroxine, Metoprolol, and Hydrochlorothiazide refills sent to pharmacy.

## 2013-01-07 ENCOUNTER — Encounter: Payer: Self-pay | Admitting: Internal Medicine

## 2013-01-11 ENCOUNTER — Encounter: Payer: Self-pay | Admitting: *Deleted

## 2013-01-11 ENCOUNTER — Encounter: Payer: Self-pay | Admitting: Gynecology

## 2013-01-11 ENCOUNTER — Telehealth: Payer: Self-pay | Admitting: *Deleted

## 2013-01-11 NOTE — Telephone Encounter (Signed)
Message copied by Verdie Shire on Mon Jan 11, 2013  1:25 PM ------      Message from: Pecola Lawless      Created: Thu Jan 07, 2013 10:21 AM       Findings : lowest T score - 1.6 @  femoral neck (hip)      Diagnosis: Osteopenia      Recommended lifestyle interventions to prevent Osteoporosis include calcium 600 mg daily  & vitamin D3 supplementation to keep vit D  level @ least 40-60. The usual vitamin D3 dose is 1000 IU daily; but individual dose is determined by annual vitamin D level monitor. Also weight bearing exercise such as  walking 30-45 minutes 3-4  X per week is recommended.       Repeat BMD every 25 months.              ------

## 2013-01-11 NOTE — Telephone Encounter (Signed)
Letter mailed.//AB/CMA 

## 2013-01-12 ENCOUNTER — Encounter: Payer: Self-pay | Admitting: Gynecology

## 2013-01-26 ENCOUNTER — Encounter: Payer: Self-pay | Admitting: Gynecology

## 2013-03-10 ENCOUNTER — Other Ambulatory Visit: Payer: Self-pay

## 2013-03-10 MED ORDER — ESTRADIOL 0.05 MG/24HR TD PTTW: 1.0000 | MEDICATED_PATCH | TRANSDERMAL | Status: DC

## 2013-04-02 ENCOUNTER — Other Ambulatory Visit: Payer: Self-pay | Admitting: *Deleted

## 2013-04-02 MED ORDER — LEVOTHYROXINE SODIUM 50 MCG PO TABS: ORAL_TABLET | ORAL | Status: DC

## 2013-04-02 MED ORDER — METOPROLOL SUCCINATE ER 50 MG PO TB24: ORAL_TABLET | ORAL | Status: DC

## 2013-04-02 MED ORDER — HYDROCHLOROTHIAZIDE 25 MG PO TABS: ORAL_TABLET | ORAL | Status: DC

## 2013-04-02 NOTE — Telephone Encounter (Signed)
Rx's sent to the pharmacy by e-script.//AB/CMA 

## 2013-04-13 ENCOUNTER — Encounter: Payer: Self-pay | Admitting: Internal Medicine

## 2013-04-13 ENCOUNTER — Ambulatory Visit (INDEPENDENT_AMBULATORY_CARE_PROVIDER_SITE_OTHER): Payer: Medicare Other | Admitting: Internal Medicine

## 2013-04-13 ENCOUNTER — Other Ambulatory Visit (INDEPENDENT_AMBULATORY_CARE_PROVIDER_SITE_OTHER): Payer: Medicare Other

## 2013-04-13 VITALS — BP 100/70 | HR 59 | Temp 97.8°F | Wt 110.0 lb

## 2013-04-13 DIAGNOSIS — E039 Hypothyroidism, unspecified: Secondary | ICD-10-CM

## 2013-04-13 DIAGNOSIS — R7309 Other abnormal glucose: Secondary | ICD-10-CM

## 2013-04-13 DIAGNOSIS — E782 Mixed hyperlipidemia: Secondary | ICD-10-CM

## 2013-04-13 DIAGNOSIS — I10 Essential (primary) hypertension: Secondary | ICD-10-CM

## 2013-04-13 DIAGNOSIS — L408 Other psoriasis: Secondary | ICD-10-CM

## 2013-04-13 DIAGNOSIS — Z0000 Encounter for general adult medical examination without abnormal findings: Secondary | ICD-10-CM

## 2013-04-13 DIAGNOSIS — M949 Disorder of cartilage, unspecified: Secondary | ICD-10-CM

## 2013-04-13 DIAGNOSIS — Z86010 Personal history of colonic polyps: Secondary | ICD-10-CM

## 2013-04-13 DIAGNOSIS — Z23 Encounter for immunization: Secondary | ICD-10-CM

## 2013-04-13 DIAGNOSIS — M899 Disorder of bone, unspecified: Secondary | ICD-10-CM

## 2013-04-13 DIAGNOSIS — E041 Nontoxic single thyroid nodule: Secondary | ICD-10-CM

## 2013-04-13 LAB — BASIC METABOLIC PANEL
BUN: 18 mg/dL (ref 6–23)
CHLORIDE: 103 meq/L (ref 96–112)
CO2: 33 meq/L — AB (ref 19–32)
Calcium: 9.4 mg/dL (ref 8.4–10.5)
Creatinine, Ser: 1 mg/dL (ref 0.4–1.2)
GFR: 59.66 mL/min — ABNORMAL LOW (ref >60.00)
GLUCOSE: 87 mg/dL (ref 70–99)
Potassium: 4.2 mEq/L (ref 3.5–5.1)
Sodium: 138 mEq/L (ref 135–145)

## 2013-04-13 LAB — HEMOGLOBIN A1C: Hgb A1c MFr Bld: 5.4 % (ref 4.6–6.5)

## 2013-04-13 LAB — LIPID PANEL
CHOL/HDL RATIO: 2
CHOLESTEROL: 223 mg/dL — AB (ref 0–200)
HDL: 101.9 mg/dL (ref >39.00)
LDL CALC: 106 mg/dL — AB (ref 0–99)
TRIGLYCERIDES: 75 mg/dL (ref 0.0–149.0)
VLDL: 15 mg/dL (ref 0.0–40.0)

## 2013-04-13 LAB — CBC WITH DIFFERENTIAL/PLATELET
BASOS PCT: 1.7 % (ref 0.0–3.0)
Basophils Absolute: 0.1 10*3/uL (ref 0.0–0.1)
EOS ABS: 0.1 10*3/uL (ref 0.0–0.7)
Eosinophils Relative: 3 % (ref 0.0–5.0)
HCT: 43.3 % (ref 36.0–46.0)
HEMOGLOBIN: 14.4 g/dL (ref 12.0–15.0)
LYMPHS ABS: 1.6 10*3/uL (ref 0.7–4.0)
Lymphocytes Relative: 40.1 % (ref 12.0–46.0)
MCHC: 33.3 g/dL (ref 30.0–36.0)
MCV: 95.5 fl (ref 78.0–100.0)
MONO ABS: 0.4 10*3/uL (ref 0.1–1.0)
Monocytes Relative: 10.3 % (ref 3.0–12.0)
NEUTROS ABS: 1.8 10*3/uL (ref 1.4–7.7)
Neutrophils Relative %: 44.9 % (ref 43.0–77.0)
Platelets: 237 10*3/uL (ref 150.0–400.0)
RBC: 4.53 Mil/uL (ref 3.87–5.11)
RDW: 12.7 % (ref 11.5–14.6)
WBC: 4.1 10*3/uL — ABNORMAL LOW (ref 4.5–10.5)

## 2013-04-13 LAB — HEPATIC FUNCTION PANEL
ALBUMIN: 4 g/dL (ref 3.5–5.2)
ALT: 16 U/L (ref 0–35)
AST: 25 U/L (ref 0–37)
Alkaline Phosphatase: 57 U/L (ref 39–117)
BILIRUBIN DIRECT: 0.1 mg/dL (ref 0.0–0.3)
TOTAL PROTEIN: 7.2 g/dL (ref 6.0–8.3)
Total Bilirubin: 0.7 mg/dL (ref 0.3–1.2)

## 2013-04-13 LAB — TSH: TSH: 1.58 u[IU]/mL (ref 0.35–5.50)

## 2013-04-13 NOTE — Progress Notes (Signed)
Pre visit review using our clinic review tool, if applicable. No additional management support is needed unless otherwise documented below in the visit note. 

## 2013-04-13 NOTE — Progress Notes (Signed)
   Subjective:    Patient ID: Renee Santos, female    DOB: [birth year], 68 y.o.   MRN: [id number]  HPI Psychosocial and medical history were reviewed as required by Medicare (history related to abuse, antisocial behavior , firearm risk): reviewed Social history: Caffeine: rare diet soda, Alcohol: only occasional use , Tobacco use: none Exercise: walks 3.5 miles x 5 days/week without symptoms Personal safety/fall risk: assessed Limitations of activities of daily living: none Seatbelt/ smoke alarm use: yes Healthcare Power of Attorney/Living Will status: yes Ophthalmologic exam status: annual with Dr. Luretha Rued, [facility], contacts Hearing evaluation status: no formal screening Orientation: Oriented X 4 Memory and recall: 2/3 word recall Spelling or math testing: math completed  Depression/anxiety assessment: assessed Foreign travel history: [place] 2014 Immunization status for influenza/pneumonia/ shingles /tetanus: flu this year, pneum today Transfusion history: no blood products Colonoscopy: last 2007; calling to reschedule Mam/PAP as per protocol/standard care: [facility], yearly. Summer 2014 last visit. Dental care: Scott Minor, DDS every 6 months, current  Chart reviewed and updated. Active issues reviewed and addressed as documented below.  Last wellness check 10/2011. Denies acute injury/illness at present.    Review of Systems  HYPERTENSION :  Disease Monitoring:  Blood pressure range-not monitored  Chest pain, palpitations- no  Dyspnea- no  Medications:  Compliance- yes  Lightheadedness,Syncope- no  Edema- no     Objective:   Physical Exam General appearance:good health ;well nourished; no acute distress or increased work of breathing is present.  No  lymphadenopathy about the head, neck, or axilla noted.  Eyes: No conjunctival inflammation or lid edema is present. There is no scleral icterus. Ears:  External ear exam shows no significant lesions or  deformities.  Otoscopic examination reveals bilateral cerumen, tympanic membranes are intact bilaterally without bulging, retraction, inflammation or discharge. Nose:  External nasal examination shows no deformity or inflammation. Nasal mucosa are pink and moist without lesions or exudates. No septal dislocation or deviation.No obstruction to airflow.  Oral exam: Dental hygiene is good; lips and gums are healthy appearing.There is no oropharyngeal erythema or exudate noted.  Neck:  No deformities, thyromegaly, masses, or tenderness noted.   Supple with full range of motion without pain.  Heart:  Normal rate and regular rhythm. S1 and S2 normal without gallop, murmur, click, rub. S4 w/o murmur.  Lungs:Chest clear to auscultation; no wheezes, rhonchi,rales ,or rubs present.No increased work of breathing.   Extremities:  No cyanosis, edema, or clubbing  noted  Skin: Warm & dry w/o jaundice or tenting.        Assessment & Plan:

## 2013-04-13 NOTE — Patient Instructions (Signed)
Your next office appointment will be determined based upon review of your pending labs. Those instructions will be transmitted to you through My Chart . 

## 2013-04-13 NOTE — Progress Notes (Signed)
   Subjective:    Patient ID: Renee Santos, female    DOB: 1945/03/28, 68 y.o.   MRN: [id number]  HPI  Medicare Wellness Visit: Psychosocial and medical history were reviewed as required by Medicare (history related to abuse, antisocial behavior , firearm risk): reviewed  Social history: Caffeine: rare diet soda, Alcohol: only occasional use , Tobacco use: none  Exercise: walks 3.5 miles x 5 days/week without symptoms  Personal safety/fall risk: assessed  Limitations of activities of daily living: none  Seatbelt/ smoke alarm use: yes  Healthcare Power of Attorney/Living Will status: yes  Ophthalmologic exam status: annual with Dr. Luretha Rued, [facility], contacts  Hearing evaluation status: no formal screening  Orientation: Oriented X 4  Memory and recall: 2/3 word recall  Spelling or math testing: math completed  Depression/anxiety assessment: assessed  Foreign travel history: [place] 2014  Immunization status for influenza/pneumonia/ shingles /tetanus: flu this year, pneum today  Transfusion history: no blood products  Colonoscopy: last 2007; calling to reschedule  Mam/PAP as per protocol/standard care: Dr Phineas Real, yearly. Summer 2014 last visit.  Dental care: Scott Minor, DDS every 6 months, current  Chart reviewed and updated. Active issues reviewed and addressed as documented below.  Last wellness check 10/2011. Denies acute injury/illness at present.     Review of Systems Disease Monitoring:  Blood pressure range-not monitored  Chest pain, palpitations- no  Dyspnea- no  Medications:  Compliance- yes  Lightheadedness,Syncope- no  Edema- no Exercise: walks 3.5 miles five days/week. Modified heart healthy diet.     Objective:   Physical Exam General appearance:thin but in good health ;well nourished; no acute distress or increased work of breathing is present. No lymphadenopathy about the head, neck, or axilla noted.  Eyes: No conjunctival inflammation or lid  edema is present. There is no scleral icterus.  Ears: External ear exam shows no significant lesions or deformities. Otoscopic examination reveals bilateral cerumen, tympanic membranes are intact bilaterally without bulging, retraction, inflammation or discharge.  Nose: External nasal examination shows no deformity or inflammation. Nasal mucosa are pink and moist without lesions or exudates. No septal dislocation or deviation.No obstruction to airflow.  Oral exam: Dental hygiene is good; lips and gums are healthy appearing.There is no oropharyngeal erythema or exudate noted.  Neck: No deformities, thyromegaly, masses, or tenderness noted. Supple with full range of motion without pain.  Thyroid palpable, small.  Heart: Normal rate and regular rhythm. S1 and S2 normal without gallop, murmur, click, rub. S4 w/o murmur.  Lungs:Chest clear to auscultation; no wheezes, rhonchi,rales ,or rubs present.No increased work of breathing.  Bowel sounds are normal. Abdomen is soft and nontender with no organomegaly, hernias  or masses. Gyn exam: as per Gyn Extremities: No cyanosis, edema, or clubbing noted  Skin: Warm & dry      Assessment & Plan:  #1 Medicare Wellness Exam; criteria met ; data entered #2 Problem List/Diagnoses reviewed Plan:  Assessments made/ Orders entered   Plan: see Orders  & Recommendations

## 2013-04-16 LAB — VITAMIN D 1,25 DIHYDROXY
Vitamin D 1, 25 (OH)2 Total: 53 pg/mL (ref 18–72)
Vitamin D2 1, 25 (OH)2: <8 pg/mL
Vitamin D3 1, 25 (OH)2: 53 pg/mL

## 2013-05-10 ENCOUNTER — Telehealth: Payer: Self-pay | Admitting: *Deleted

## 2013-05-10 NOTE — Telephone Encounter (Signed)
Pt left message with questions about minivelle patch. I left voicemail for pt to call.

## 2013-05-10 NOTE — Telephone Encounter (Signed)
Pt called and said minivelle patch 0.05mg  prescribed on 09/24/12 is $200 for 3 month supply, the estradiol (vivelle dot) 0.05mg  would be $165 for 3 month supply. Pt said she would like to switch to vivelle dot which is cheaper, I explained to pt the only difference is the size in patch. If you are okay with this rx will be sent to for vivelle dot. Please advise

## 2013-05-11 MED ORDER — ESTRADIOL 0.05 MG/24HR TD PTTW: 1.0000 | MEDICATED_PATCH | TRANSDERMAL | Status: DC

## 2013-05-11 NOTE — Telephone Encounter (Signed)
rx sent

## 2013-05-11 NOTE — Telephone Encounter (Signed)
Okay to switch?  

## 2013-07-16 ENCOUNTER — Other Ambulatory Visit: Payer: Self-pay

## 2013-07-16 MED ORDER — HYDROCHLOROTHIAZIDE 25 MG PO TABS: ORAL_TABLET | ORAL | Status: DC

## 2013-07-19 ENCOUNTER — Other Ambulatory Visit: Payer: Self-pay

## 2013-07-19 MED ORDER — LEVOTHYROXINE SODIUM 50 MCG PO TABS: ORAL_TABLET | ORAL | Status: DC

## 2013-09-13 ENCOUNTER — Encounter: Payer: Self-pay | Admitting: Internal Medicine

## 2013-10-22 ENCOUNTER — Encounter: Payer: Self-pay | Admitting: Internal Medicine

## 2013-10-25 ENCOUNTER — Other Ambulatory Visit: Payer: Self-pay

## 2013-10-25 MED ORDER — METOPROLOL SUCCINATE ER 50 MG PO TB24: ORAL_TABLET | ORAL | Status: DC

## 2013-11-26 ENCOUNTER — Other Ambulatory Visit: Payer: Self-pay

## 2013-12-13 ENCOUNTER — Encounter: Payer: Self-pay | Admitting: Internal Medicine

## 2013-12-15 ENCOUNTER — Other Ambulatory Visit (HOSPITAL_COMMUNITY)
Admission: RE | Admit: 2013-12-15 | Discharge: 2013-12-15 | Disposition: A | Discharge status: Home / Self Care | Payer: Medicare Other | Source: Ambulatory Visit | Attending: Gynecology | Admitting: Gynecology

## 2013-12-15 ENCOUNTER — Ambulatory Visit (INDEPENDENT_AMBULATORY_CARE_PROVIDER_SITE_OTHER): Payer: Medicare Other | Admitting: Gynecology

## 2013-12-15 ENCOUNTER — Encounter: Payer: Self-pay | Admitting: Gynecology

## 2013-12-15 VITALS — BP 116/74 | Ht 62.0 in | Wt 110.0 lb

## 2013-12-15 DIAGNOSIS — Z23 Encounter for immunization: Secondary | ICD-10-CM

## 2013-12-15 DIAGNOSIS — Z124 Encounter for screening for malignant neoplasm of cervix: Secondary | ICD-10-CM | POA: Diagnosis present

## 2013-12-15 DIAGNOSIS — N952 Postmenopausal atrophic vaginitis: Secondary | ICD-10-CM

## 2013-12-15 DIAGNOSIS — Z1272 Encounter for screening for malignant neoplasm of vagina: Secondary | ICD-10-CM

## 2013-12-15 DIAGNOSIS — N393 Stress incontinence (female) (male): Secondary | ICD-10-CM

## 2013-12-15 DIAGNOSIS — Z79890 Hormone replacement therapy (postmenopausal): Secondary | ICD-10-CM

## 2013-12-15 MED ORDER — ESTRADIOL 0.05 MG/24HR TD PTTW: 1.0000 | MEDICATED_PATCH | TRANSDERMAL | Status: DC

## 2013-12-15 NOTE — Progress Notes (Signed)
Renee Santos 05-16-1945 [id number]        68 y.o.  G2P2 for follow up exam.  Several issues noted below.  Past medical history,surgical history, problem list, medications, allergies, family history and social history were all reviewed and documented as reviewed in the EPIC chart.  ROS:  12 system ROS performed with pertinent positives and negatives included in the history, assessment and plan.   Additional significant findings :  none   Exam: Kim Counsellor Vitals:   12/15/13 0848  BP: 116/74  Height: 5\' 2"  (1.575 m)  Weight: 110 lb (49.896 kg)   General appearance:  Normal affect, orientation and appearance. Skin: Grossly normal HEENT: Without gross lesions.  No cervical or supraclavicular adenopathy. Thyroid normal.  Lungs:  Clear without wheezing, rales or rhonchi Cardiac: RR, without RMG Abdominal:  Soft, nontender, without masses, guarding, rebound, organomegaly or hernia Breasts:  Examined lying and sitting without masses, retractions, discharge or axillary adenopathy. Pelvic:  Ext/BUS/vagina with generalized atrophic changes. Pap of cuff done  Adnexa  Without masses or tenderness    Anus and perineum  Normal   Rectovaginal  Normal sphincter tone without palpated masses or tenderness.    Assessment/Plan:  68 y.o. G2P2 female for follow up exam.   1. Postmenopausal/HRT. Status post TAH LSO for endometrioma. On Vivelle 0.05 mg patches. Doing well.  I again reviewed the whole issue of HRT with her to include the WHI study with increased risk of stroke, heart attack, DVT and breast cancer. The ACOG and NAMS statements for lowest dose for the shortest period of time reviewed. Transdermal versus oral first-pass effect benefit discussed.  Patient strongly wants to continue and I refilled her 1 year. 2. Urinary incontinence, primarily stress symptoms with loss of urine with coughing sneezing laughing. Options to include [place] exercises, OTC products and surgery reviewed.  Options for referral to urology discussed. Patient's not interested at this time but will follow up if she is. Check urinalysis today. 3. Osteopenia.  DEXA 12/2012 T score -1.6. Plan repeat DEXA next year at two-year interval. Increase calcium and vitamin D recommendations. 4. Pap smear 2012. Pap smear of vaginal cuff today. Options to stop screening as she is status post hysterectomy for benign indications and age 68 versus less frequent screening intervals per current screening guidelines.. Will readdress on an annual basis. 5. Mammography coming due in November and patient will follow up for this. SBE monthly reviewed. 6. Colonoscopy scheduled and she will follow up for this. 7. Health maintenance. No routine blood work done as this will be done at her memory physician's office. Follow up in one year, sooner as needed.     Anastasio Auerbach MD, 9:27 AM 12/15/2013

## 2013-12-15 NOTE — Addendum Note (Signed)
Addended by: Nelva Nay on: 12/15/2013 09:37 AM   Modules accepted: Orders

## 2013-12-15 NOTE — Patient Instructions (Signed)
You may obtain a copy of any labs that were done today by logging onto MyChart as outlined in the instructions provided with your AVS (after visit summary). The office will not call with normal lab results but certainly if there are any significant abnormalities then we will contact you.   Health Maintenance, Female A healthy lifestyle and preventative care can promote health and wellness.  Maintain regular health, dental, and eye exams.  Eat a healthy diet. Foods like vegetables, fruits, whole grains, low-fat dairy products, and lean protein foods contain the nutrients you need without too many calories. Decrease your intake of foods high in solid fats, added sugars, and salt. Get information about a proper diet from your caregiver, if necessary.  Regular physical exercise is one of the most important things you can do for your health. Most adults should get at least 150 minutes of moderate-intensity exercise (any activity that increases your heart rate and causes you to sweat) each week. In addition, most adults need muscle-strengthening exercises on 2 or more days a week.   Maintain a healthy weight. The body mass index (BMI) is a screening tool to identify possible weight problems. It provides an estimate of body fat based on height and weight. Your caregiver can help determine your BMI, and can help you achieve or maintain a healthy weight. For adults 20 years and older:  A BMI below 18.5 is considered underweight.  A BMI of 18.5 to 24.9 is normal.  A BMI of 25 to 29.9 is considered overweight.  A BMI of 30 and above is considered obese.  Maintain normal blood lipids and cholesterol by exercising and minimizing your intake of saturated fat. Eat a balanced diet with plenty of fruits and vegetables. Blood tests for lipids and cholesterol should begin at age 61 and be repeated every 5 years. If your lipid or cholesterol levels are high, you are over 50, or you are a high risk for heart  disease, you may need your cholesterol levels checked more frequently.Ongoing high lipid and cholesterol levels should be treated with medicines if diet and exercise are not effective.  If you smoke, find out from your caregiver how to quit. If you do not use tobacco, do not start.  Lung cancer screening is recommended for adults aged 33 80 years who are at high risk for developing lung cancer because of a history of smoking. Yearly low-dose computed tomography (CT) is recommended for people who have at least a 30-pack-year history of smoking and are a current smoker or have quit within the past 15 years. A pack year of smoking is smoking an average of 1 pack of cigarettes a day for 1 year (for example: 1 pack a day for 30 years or 2 packs a day for 15 years). Yearly screening should continue until the smoker has stopped smoking for at least 15 years. Yearly screening should also be stopped for people who develop a health problem that would prevent them from having lung cancer treatment.  If you are pregnant, do not drink alcohol. If you are breastfeeding, be very cautious about drinking alcohol. If you are not pregnant and choose to drink alcohol, do not exceed 1 drink per day. One drink is considered to be 12 ounces (355 mL) of beer, 5 ounces (148 mL) of wine, or 1.5 ounces (44 mL) of liquor.  Avoid use of street drugs. Do not share needles with anyone. Ask for help if you need support or instructions about stopping  the use of drugs.  High blood pressure causes heart disease and increases the risk of stroke. Blood pressure should be checked at least every 1 to 2 years. Ongoing high blood pressure should be treated with medicines, if weight loss and exercise are not effective.  If you are 59 to 66 years old, ask your caregiver if you should take aspirin to prevent strokes.  Diabetes screening involves taking a blood sample to check your fasting blood sugar level. This should be done once every 3  years, after age 91, if you are within normal weight and without risk factors for diabetes. Testing should be considered at a younger age or be carried out more frequently if you are overweight and have at least 1 risk factor for diabetes.  Breast cancer screening is essential preventative care for women. You should practice "breast self-awareness." This means understanding the normal appearance and feel of your breasts and may include breast self-examination. Any changes detected, no matter how small, should be reported to a caregiver. Women in their 66s and 30s should have a clinical breast exam (CBE) by a caregiver as part of a regular health exam every 1 to 3 years. After age 101, women should have a CBE every year. Starting at age 100, women should consider having a mammogram (breast X-ray) every year. Women who have a family history of breast cancer should talk to their caregiver about genetic screening. Women at a high risk of breast cancer should talk to their caregiver about having an MRI and a mammogram every year.  Breast cancer gene (BRCA)-related cancer risk assessment is recommended for women who have family members with BRCA-related cancers. BRCA-related cancers include breast, ovarian, tubal, and peritoneal cancers. Having family members with these cancers may be associated with an increased risk for harmful changes (mutations) in the breast cancer genes BRCA1 and BRCA2. Results of the assessment will determine the need for genetic counseling and BRCA1 and BRCA2 testing.  The Pap test is a screening test for cervical cancer. Women should have a Pap test starting at age 57. Between ages 25 and 35, Pap tests should be repeated every 2 years. Beginning at age 37, you should have a Pap test every 3 years as long as the past 3 Pap tests have been normal. If you had a hysterectomy for a problem that was not cancer or a condition that could lead to cancer, then you no longer need Pap tests. If you are  between ages 50 and 76, and you have had normal Pap tests going back 10 years, you no longer need Pap tests. If you have had past treatment for cervical cancer or a condition that could lead to cancer, you need Pap tests and screening for cancer for at least 20 years after your treatment. If Pap tests have been discontinued, risk factors (such as a new sexual partner) need to be reassessed to determine if screening should be resumed. Some women have medical problems that increase the chance of getting cervical cancer. In these cases, your caregiver may recommend more frequent screening and Pap tests.  The human papillomavirus (HPV) test is an additional test that may be used for cervical cancer screening. The HPV test looks for the virus that can cause the cell changes on the cervix. The cells collected during the Pap test can be tested for HPV. The HPV test could be used to screen women aged 44 years and older, and should be used in women of any age  who have unclear Pap test results. After the age of 40, women should have HPV testing at the same frequency as a Pap test.  Colorectal cancer can be detected and often prevented. Most routine colorectal cancer screening begins at the age of 85 and continues through age 34. However, your caregiver may recommend screening at an earlier age if you have risk factors for colon cancer. On a yearly basis, your caregiver may provide home test kits to check for hidden blood in the stool. Use of a small camera at the end of a tube, to directly examine the colon (sigmoidoscopy or colonoscopy), can detect the earliest forms of colorectal cancer. Talk to your caregiver about this at age 15, when routine screening begins. Direct examination of the colon should be repeated every 5 to 10 years through age 14, unless early forms of pre-cancerous polyps or small growths are found.  Hepatitis C blood testing is recommended for all people born from [birth year] through 1963 and any  individual with known risks for hepatitis C.  Practice safe sex. Use condoms and avoid high-risk sexual practices to reduce the spread of sexually transmitted infections (STIs). Sexually active women aged 93 and younger should be checked for Chlamydia, which is a common sexually transmitted infection. Older women with new or multiple partners should also be tested for Chlamydia. Testing for other STIs is recommended if you are sexually active and at increased risk.  Osteoporosis is a disease in which the bones lose minerals and strength with aging. This can result in serious bone fractures. The risk of osteoporosis can be identified using a bone density scan. Women ages 65 and over and women at risk for fractures or osteoporosis should discuss screening with their caregivers. Ask your caregiver whether you should be taking a calcium supplement or vitamin D to reduce the rate of osteoporosis.  Menopause can be associated with physical symptoms and risks. Hormone replacement therapy is available to decrease symptoms and risks. You should talk to your caregiver about whether hormone replacement therapy is right for you.  Use sunscreen. Apply sunscreen liberally and repeatedly throughout the day. You should seek shade when your shadow is shorter than you. Protect yourself by wearing long sleeves, pants, a wide-brimmed hat, and sunglasses year round, whenever you are outdoors.  Notify your caregiver of new moles or changes in moles, especially if there is a change in shape or color. Also notify your caregiver if a mole is larger than the size of a pencil eraser.  Stay current with your immunizations. Document Released: 08/13/2010 Document Revised: 05/25/2012 Document Reviewed: 08/13/2010 [facility] Patient Information 2014 [place].

## 2013-12-16 ENCOUNTER — Encounter: Payer: Self-pay | Admitting: Internal Medicine

## 2013-12-16 ENCOUNTER — Other Ambulatory Visit (INDEPENDENT_AMBULATORY_CARE_PROVIDER_SITE_OTHER): Payer: Medicare Other

## 2013-12-16 ENCOUNTER — Ambulatory Visit (INDEPENDENT_AMBULATORY_CARE_PROVIDER_SITE_OTHER): Payer: Medicare Other | Admitting: Internal Medicine

## 2013-12-16 ENCOUNTER — Ambulatory Visit (AMBULATORY_SURGERY_CENTER): Payer: Self-pay | Admitting: *Deleted

## 2013-12-16 VITALS — Ht 62.0 in | Wt 111.0 lb

## 2013-12-16 VITALS — BP 118/80 | HR 64 | Temp 98.1°F | Resp 12 | Ht 62.0 in | Wt 110.5 lb

## 2013-12-16 DIAGNOSIS — E038 Other specified hypothyroidism: Secondary | ICD-10-CM

## 2013-12-16 DIAGNOSIS — M8580 Other specified disorders of bone density and structure, unspecified site: Secondary | ICD-10-CM

## 2013-12-16 DIAGNOSIS — E782 Mixed hyperlipidemia: Secondary | ICD-10-CM

## 2013-12-16 DIAGNOSIS — Z86010 Personal history of colonic polyps: Secondary | ICD-10-CM

## 2013-12-16 DIAGNOSIS — E041 Nontoxic single thyroid nodule: Secondary | ICD-10-CM

## 2013-12-16 DIAGNOSIS — I10 Essential (primary) hypertension: Secondary | ICD-10-CM

## 2013-12-16 DIAGNOSIS — Z1211 Encounter for screening for malignant neoplasm of colon: Secondary | ICD-10-CM

## 2013-12-16 LAB — CYTOLOGY - PAP

## 2013-12-16 LAB — BASIC METABOLIC PANEL
BUN: 19 mg/dL (ref 6–23)
CALCIUM: 9.3 mg/dL (ref 8.4–10.5)
CO2: 27 meq/L (ref 19–32)
Chloride: 101 mEq/L (ref 96–112)
Creatinine, Ser: 1 mg/dL (ref 0.4–1.2)
GFR: 56.24 mL/min — ABNORMAL LOW (ref >60.00)
Glucose, Bld: 84 mg/dL (ref 70–99)
Potassium: 3.6 mEq/L (ref 3.5–5.1)
SODIUM: 138 meq/L (ref 135–145)

## 2013-12-16 LAB — TSH: TSH: 1.44 u[IU]/mL (ref 0.35–4.50)

## 2013-12-16 MED ORDER — LORAZEPAM 0.5 MG PO TABS: ORAL_TABLET | ORAL | Status: DC

## 2013-12-16 NOTE — Progress Notes (Signed)
Subjective:    Patient ID: Renee Santos, female    DOB: 1945-10-19, 68 y.o.   MRN: [id number]  HPI She is here to assess active health issues & conditions. PMH, FH, & Social history verified & updated  She usually follows a heart healthy diet. She is not restricting sodium.She walks 4 miles 5 days a week without cardiopulmonary symptoms  She's been compliant with her antihypertensive medications and thyroid medicines without adverse effect.  There is no blood pressure monitor at home.  There is no family history of premature heart attack or stroke. Her mother did have coronary artery disease.  She is scheduled for colonoscopy in the near future. Last study revealed a polyp in 2007 she has no active GI symptoms. Weight loss has been purposeful.    Review of Systems  Intermittent anxiety reported ; triggers include driving on interstate highway  Chest pain, palpitations, tachycardia, exertional dyspnea, paroxysmal nocturnal dyspnea, claudication or edema are absent.  Unexplained weight loss, abdominal pain, significant dyspepsia, dysphagia, melena, rectal bleeding, or persistently small caliber stools are denied.      Objective:   Physical Exam Gen.: Healthy and well-nourished in appearance. Alert, appropriate and cooperative throughout exam. Head: Normocephalic without obvious abnormalities; hair fine  Eyes: No corneal or conjunctival inflammation noted. Pupils equal round reactive to light and accommodation. Extraocular motion intact. Ears: External  ear exam reveals no significant lesions or deformities. Wax on R. Hearing is grossly normal bilaterally. Nose: External nasal exam reveals no deformity or inflammation. Nasal mucosa are pink and moist. No lesions or exudates noted.   Mouth: Oral mucosa and oropharynx reveal no lesions or exudates. Teeth in good repair. Neck: No deformities, masses, or tenderness noted. Range of motion normal. Thyroid small. Lungs: Normal respiratory  effort; chest expands symmetrically. Lungs are clear to auscultation without rales, wheezes, or increased work of breathing. Heart: Normal rate and rhythm. Normal S1 and S2. No gallop, click, or rub. No murmur. Abdomen: Bowel sounds normal; abdomen soft and nontender. No masses, organomegaly or hernias noted. Genitalia: as per Gyn                                  Musculoskeletal/extremities: No deformity or scoliosis noted of  the thoracic or lumbar spine.  No clubbing, cyanosis, edema, or significant extremity  deformity noted. Range of motion normal .Tone & strength normal. Hand joints normal  Fingernail health good. Able to lie down & sit up w/o help. Negative SLR bilaterally Vascular: Carotid, radial artery, dorsalis pedis and  posterior tibial pulses are full and equal. No bruits present. Neurologic: Alert and oriented x3. Deep tendon reflexes symmetrical but 1/2 +  Gait normal .       Skin: Intact without suspicious lesions or rashes. Lymph: No cervical, axillary lymphadenopathy present. Psych: Mood and affect are normal. Normally interactive                                                                                        Assessment & Plan:  See Current Assessment & Plan in Problem  List under specific Diagnosis

## 2013-12-16 NOTE — Assessment & Plan Note (Signed)
TSH 

## 2013-12-16 NOTE — Assessment & Plan Note (Signed)
Colonoscopy 12/30/2013

## 2013-12-16 NOTE — Progress Notes (Signed)
Patient denies any allergies to eggs or soy. Patient denies any problems with anesthesia/sedation. Patient denies any oxygen use at home and does not take any diet/weight loss medications. EMMI education assisgned to patient on colonoscopy, this was explained and instructions given to patient. 

## 2013-12-16 NOTE — Patient Instructions (Signed)
Your next office appointment will be determined based upon review of your pending labs & thyroid US. Those instructions will be transmitted to you through My Chart

## 2013-12-16 NOTE — Progress Notes (Signed)
Pre visit review using our clinic review tool, if applicable. No additional management support is needed unless otherwise documented below in the visit note. 

## 2013-12-16 NOTE — Assessment & Plan Note (Signed)
BMET 

## 2013-12-16 NOTE — Assessment & Plan Note (Signed)
Check lipids every 5 years

## 2013-12-16 NOTE — Assessment & Plan Note (Signed)
Thyroid US

## 2013-12-17 ENCOUNTER — Telehealth: Payer: Self-pay | Admitting: Internal Medicine

## 2013-12-17 NOTE — Telephone Encounter (Signed)
emmi emailed °

## 2013-12-21 ENCOUNTER — Ambulatory Visit
Admission: RE | Admit: 2013-12-21 | Discharge: 2013-12-21 | Disposition: A | Discharge status: Home / Self Care | Payer: Medicare Other | Source: Ambulatory Visit | Attending: Internal Medicine | Admitting: Internal Medicine

## 2013-12-21 DIAGNOSIS — E041 Nontoxic single thyroid nodule: Secondary | ICD-10-CM

## 2013-12-29 ENCOUNTER — Telehealth: Payer: Self-pay

## 2013-12-29 NOTE — Telephone Encounter (Signed)
Received a fax from [place] stating Lorazepam 0.5 mg tablet requires prior authorization. Prior authorization form has been completed via cover my meds. Awaiting insurance response.

## 2013-12-30 ENCOUNTER — Telehealth: Payer: Self-pay | Admitting: *Deleted

## 2013-12-30 ENCOUNTER — Ambulatory Visit (INDEPENDENT_AMBULATORY_CARE_PROVIDER_SITE_OTHER)
Admission: RE | Admit: 2013-12-30 | Discharge: 2013-12-30 | Disposition: A | Discharge status: Home / Self Care | Payer: Medicare Other | Source: Ambulatory Visit | Attending: Internal Medicine | Admitting: Internal Medicine

## 2013-12-30 ENCOUNTER — Ambulatory Visit (AMBULATORY_SURGERY_CENTER): Payer: Medicare Other | Admitting: Internal Medicine

## 2013-12-30 ENCOUNTER — Encounter: Payer: Self-pay | Admitting: Internal Medicine

## 2013-12-30 VITALS — BP 130/75 | HR 59 | Temp 96.2°F | Resp 19 | Ht 62.0 in | Wt 111.0 lb

## 2013-12-30 DIAGNOSIS — R1084 Generalized abdominal pain: Secondary | ICD-10-CM

## 2013-12-30 DIAGNOSIS — Z1211 Encounter for screening for malignant neoplasm of colon: Secondary | ICD-10-CM

## 2013-12-30 MED ORDER — SODIUM CHLORIDE 0.9 % IV SOLN: 500.0000 mL | INTRAVENOUS | Status: DC

## 2013-12-30 NOTE — Telephone Encounter (Signed)
Received a phone call from [place] with [facility] stating the prior authorization for Lorazepam 0.5 mg has been denied due to patient not meeting the criteria. A letter of denial will be mailed to the patient and our office along with instructions to appeal. If any further questions call [phone].

## 2013-12-30 NOTE — Progress Notes (Signed)
Xray results reviewed by Dr.Gessner with patient and husband. Patient continues to be without pain. Patient discharged home on clear liquids today. Patient verbalized understanding. temperature 97.3. Patient to call with any complaints including temp. Greater than 100. Chest pain or bleeding. Patient and husband verbalized agreement. Follow up call will be placed at 1600 to patient.

## 2013-12-30 NOTE — Progress Notes (Signed)
AXR showed gas filled colon and a ? Of tiny amount of free air but when she returned from xray no shoulder pain and abdomen softer and less tender (minimal) after passing some more flatus Dx trapped air - improved do not tthink perforation Will go home on clear liquids and we will check on her at 4 PM

## 2013-12-30 NOTE — Progress Notes (Signed)
1351 0/10 pain in shoulders, still a little gassy in mid abdomen.130/75, hr 59, 100%sa02.patient in fowlers position

## 2013-12-30 NOTE — Telephone Encounter (Signed)
Called to check patient. Patient just awoke from a nap. Patient stating she has not had any additional pain. No fever or abdominal swelling. Patient is on clear liquids and has drank liquids without problems. Patient has passed a small amount of gas. Patient aware to call if she has any difficulties.Note forwarded to Dr. Carlean Purl for his information.

## 2013-12-30 NOTE — Patient Instructions (Addendum)
No polyps or cancer seen! You do have a condition called diverticulosis - common and not usually a problem. Please read the handout provided.   Next routine colonoscopy in 10 years - 2025 (Consider at 35)  I appreciate the opportunity to care for you. Gatha Mayer, MD, FACG  YOU HAD AN ENDOSCOPIC PROCEDURE TODAY AT [facility]: Refer to the procedure report that was given to you for any specific questions about what was found during the examination.  If the procedure report does not answer your questions, please call your gastroenterologist to clarify.  If you requested that your care partner not be given the details of your procedure findings, then the procedure report has been included in a sealed envelope for you to review at your convenience later.  YOU SHOULD EXPECT: Some feelings of bloating in the abdomen. Passage of more gas than usual.  Walking can help get rid of the air that was put into your GI tract during the procedure and reduce the bloating. If you had a lower endoscopy (such as a colonoscopy or flexible sigmoidoscopy) you may notice spotting of blood in your stool or on the toilet paper. If you underwent a bowel prep for your procedure, then you may not have a normal bowel movement for a few days.  DIET: Your first meal following the procedure should be a light meal and then it is ok to progress to your normal diet.  A half-sandwich or bowl of soup is an example of a good first meal.  Heavy or fried foods are harder to digest and may make you feel nauseous or bloated.  Likewise meals heavy in dairy and vegetables can cause extra gas to form and this can also increase the bloating.  Drink plenty of fluids but you should avoid alcoholic beverages for 24 hours.  ACTIVITY: Your care partner should take you home directly after the procedure.  You should plan to take it easy, moving slowly for the rest of the day.  You can resume normal activity the day after  the procedure however you should NOT DRIVE or use heavy machinery for 24 hours (because of the sedation medicines used during the test).    SYMPTOMS TO REPORT IMMEDIATELY: A gastroenterologist can be reached at any hour.  During normal business hours, 8:30 AM to 5:00 PM Monday through Friday, call [phone].  After hours and on weekends, please call the GI answering service at [phone] who will take a message and have the physician on call contact you.   Following lower endoscopy (colonoscopy or flexible sigmoidoscopy):  Excessive amounts of blood in the stool  Significant tenderness or worsening of abdominal pains  Swelling of the abdomen that is new, acute  Fever of 100F or higher   FOLLOW UP: If any biopsies were taken you will be contacted by phone or by letter within the next 1-3 weeks.  Call your gastroenterologist if you have not heard about the biopsies in 3 weeks.  Our staff will call the home number listed on your records the next business day following your procedure to check on you and address any questions or concerns that you may have at that time regarding the information given to you following your procedure. This is a courtesy call and so if there is no answer at the home number and we have not heard from you through the emergency physician on call, we will assume that you have returned to your regular  daily activities without incident.  SIGNATURES/CONFIDENTIALITY: You and/or your care partner have signed paperwork which will be entered into your electronic medical record.  These signatures attest to the fact that that the information above on your After Visit Summary has been reviewed and is understood.  Full responsibility of the confidentiality of this discharge information lies with you and/or your care-partner.  Diverticulosis-handout given  Repeat colonoscopy in 10 years-2025.

## 2013-12-30 NOTE — Progress Notes (Signed)
  [facility] Post-op Note  Patient: Renee Santos  Procedure(s) Performed: colonoscopy  Patient Location: [facility] - Recovery Area  Anesthesia Type: Deep Sedation/Propofol  Level of Consciousness: awake, oriented and patient cooperative  Airway and Oxygen Therapy: Patient Spontanous Breathing  Post-op Pain: none  Post-op Assessment:  Post-op Vital signs reviewed, Patient's Cardiovascular Status Stable, Respiratory Function Stable, Patent Airway, No signs of Nausea or vomiting and Pain level controlled  Post-op Vital Signs: Reviewed and stable  Complications: No apparent anesthesia complications  ,  E 12:18 PM

## 2013-12-30 NOTE — Progress Notes (Signed)
She was originally ok but later into recovery began to have bilateral shoulder pain after sitting up. Abd is mildly distended, soft and slightly tender Will get acute abd series

## 2013-12-30 NOTE — Op Note (Signed)
[place]  [employer], [address]   COLONOSCOPY PROCEDURE REPORT  PATIENT: Renee Santos, Renee Santos  MR#: [id number] BIRTHDATE: 1945/08/10 , 68  yrs. old GENDER: female ENDOSCOPIST: Gatha Mayer, MD, [facility] PROCEDURE DATE:  12/30/2013 PROCEDURE:   Colonoscopy, screening First Screening Colonoscopy - Avg.  risk and is 50 yrs.  old or older - No.  Prior Negative Screening - Now for repeat screening. 10 or more years since last screening  History of Adenoma - Now for follow-up colonoscopy & has been > or = to 3 yrs.  N/A  Polyps Removed Today? No.  Polyps Removed Today? No.  Recommend repeat exam, <10 yrs? Polyps Removed Today? No.  Recommend repeat exam, <10 yrs? No. ASA CLASS:   Class II INDICATIONS:average risk for colorectal cancer. MEDICATIONS: Propofol 200 mg IV and Monitored anesthesia care  DESCRIPTION OF PROCEDURE:   After the risks benefits and alternatives of the procedure were thoroughly explained, informed consent was obtained.  The digital rectal exam revealed no abnormalities of the rectum.   The LB [id number]  endoscope was introduced through the anus and advanced to the cecum, which was identified by both the appendix and ileocecal valve. No adverse events experienced.   The quality of the prep was excellent, using MiraLax  The instrument was then slowly withdrawn as the colon was fully examined.      COLON FINDINGS: Sigmoid diverticulosis.  Otherwise normal colonoscopy.  Retroflexed views revealed no abnormalities. The time to cecum=4 minutes 09 seconds.  Withdrawal time=9 minutes 01 seconds.  The scope was withdrawn and the procedure completed. COMPLICATIONS: There were no immediate complications.  ENDOSCOPIC IMPRESSION: Sigmoid diverticulosis. Otherwise normal colonoscopy excellent prep - second screening  RECOMMENDATIONS: Repeat Colonscopy in 10 years - 2025  eSigned:  Gatha Mayer, MD, [facility] 12/30/2013 12:18 PM   cc: The  Patient

## 2013-12-30 NOTE — Progress Notes (Signed)
1244 patient oob to dress, began to complain of bilateral shoulder pain, and some mid abdominal "gas" pain. Shoulder pain, bilaterally with patient pointing to shoulders. 7/10 pain with some relief on bending over. Patient to bathroom, no gas passed 1300dr. gessner notified of patient's complaints and examined by Dr. Carlean Purl. Order for abd films and chest xray in basement of building. Dr. Carlean Purl placing order and callradiology staff. To ensure their availability. 140/74, hr 60, no chest pain or dyspnea.patient to xray per w/c, patient able to stand erect during chest film. 1337back from xray, 3/10 shoulder discomfort.130/73, hr-61, sa02-100 %.awaiting call report.

## 2013-12-31 ENCOUNTER — Telehealth: Payer: Self-pay | Admitting: *Deleted

## 2013-12-31 NOTE — Telephone Encounter (Signed)
  Follow up Call-  Call back number 12/30/2013  Post procedure Call Back phone  # [phone]  Permission to leave phone message Yes     Patient questions:  Message left to call us if necessary.

## 2014-01-03 ENCOUNTER — Encounter: Payer: Self-pay | Admitting: Internal Medicine

## 2014-01-14 ENCOUNTER — Telehealth: Payer: Self-pay | Admitting: *Deleted

## 2014-01-14 NOTE — Telephone Encounter (Signed)
[facility] Patient Name: Renee Santos Gender: Female DOB: [birth year] Age: 68 Y 46 M 12 D Return Phone Number: [phone] (Primary) Address: City/State/Zip: [place] Client [facility] - Client Client Site [place] - Day Physician [place], [place] Type Call Call Type Triage / Clinical Caller Name Naydene Relationship To Patient Self Return Phone Number [phone] (Primary) Chief Complaint CHEST PAIN (>=21 years) - pain, pressure, heaviness or tightness Initial Comment Caller states she has been having pain in her chest. PreDisposition Did not know what to do Nurse Assessment Nurse: Vallery Sa, RN, [place] Date/Time (Eastern Time): 01/14/2014 10:34:07 AM Confirm and document reason for call. If symptomatic, describe symptoms. ---Caller states she developed chest pain (1 on the 1 to 10 scale) a week ago. No breathing difficulty. No fever. Has the patient traveled out of the country within the last 30 days? ---No Does the patient require triage? ---Yes Related visit to physician within the last 2 weeks? ---Yes Does the PT have any chronic conditions? (i.e. diabetes, asthma, etc.) ---Yes List chronic conditions. ---Low Thyroid problems, High Blood Pressure, Guidelines Guideline Title Affirmed Question Affirmed Notes Nurse Date/Time Eilene Ghazi Time) Chest Pain Taking a deep breath makes pain worse Vallery Sa, RN, Cathy 01/14/2014 10:37:28 AM Disp. Time Eilene Ghazi Time) Disposition Final User 01/14/2014 10:31:55 AM Send to Urgent Antony Haste 01/14/2014 10:41:46 AM Go to ED Now (or PCP triage) Yes Vallery Sa, RN, Rosey Bath Understands: Yes Disagree/Comply: Disagree Disagree/Comply Reason: Disagree with instructions Care Advice Given Per Guideline PLEASE NOTE: All timestamps contained within this report are represented as [place] Standard Time. CONFIDENTIALTY NOTICE:  This fax transmission is intended only for the addressee. It contains information that is legally privileged, confidential or otherwise protected from use or disclosure. If you are not the intended recipient, you are strictly prohibited from reviewing, disclosing, copying using or disseminating any of this information or taking any action in reliance on or regarding this information. If you have received this fax in error, please notify us immediately by telephone so that we can arrange for its return to [place]. Phone: [phone], Toll-Free: [phone], Fax: [phone] Page: 2 of 2 Call Id: Wright Advice Given Per Guideline GO TO ED NOW (OR PCP TRIAGE): * IF NO PCP TRIAGE: You need to be seen. Go to the [facility] within the next hour. Leave as soon as you can. BRING MEDICINES: * Please bring a list of your current medicines when you go to see the doctor. * It is also a good idea to bring the pill bottles too. This will help the doctor to make certain you are taking the right medicines and the right dose. CALL EMS IF: * Severe difficulty breathing occurs * Passes out or becomes too weak to stand * You become worse. CARE ADVICE given per Chest Pain (Adult) guideline. Comments User: Berton Mount, RN Date/Time Eilene Ghazi Time): 01/14/2014 10:49:16 AM Caller giving conflicting triage responses. Caller states that she doesn't want to go to the ER or see MD today because she is taking her grandchildren to the [facility]. Called the office backline and Edwena Blow will assist her further with direction from the office Triage Nurse. Referrals GO TO FACILITY OTHER - SPECIF

## 2014-01-19 ENCOUNTER — Encounter: Payer: Self-pay | Admitting: Gynecology

## 2014-01-20 ENCOUNTER — Ambulatory Visit: Payer: Medicare Other | Admitting: Internal Medicine

## 2014-01-20 ENCOUNTER — Other Ambulatory Visit: Payer: Self-pay | Admitting: Internal Medicine

## 2014-01-21 ENCOUNTER — Ambulatory Visit (INDEPENDENT_AMBULATORY_CARE_PROVIDER_SITE_OTHER): Payer: Medicare Other | Admitting: Internal Medicine

## 2014-01-21 ENCOUNTER — Encounter: Payer: Self-pay | Admitting: Internal Medicine

## 2014-01-21 VITALS — BP 118/86 | HR 73 | Temp 98.0°F | Wt 110.2 lb

## 2014-01-21 DIAGNOSIS — R0789 Other chest pain: Secondary | ICD-10-CM

## 2014-01-21 DIAGNOSIS — E038 Other specified hypothyroidism: Secondary | ICD-10-CM

## 2014-01-21 DIAGNOSIS — R202 Paresthesia of skin: Secondary | ICD-10-CM

## 2014-01-21 DIAGNOSIS — I10 Essential (primary) hypertension: Secondary | ICD-10-CM

## 2014-01-21 MED ORDER — LEVOTHYROXINE SODIUM 50 MCG PO TABS: ORAL_TABLET | ORAL | Status: DC

## 2014-01-21 MED ORDER — METOPROLOL SUCCINATE ER 50 MG PO TB24: ORAL_TABLET | ORAL | Status: DC

## 2014-01-21 NOTE — Patient Instructions (Addendum)
Reflux of gastric acid may be asymptomatic as this may occur mainly during sleep.The triggers for reflux  include stress; the "aspirin family" ; alcohol; peppermint; and caffeine (coffee, tea, cola, and chocolate). The aspirin family would include aspirin and the nonsteroidal agents such as ibuprofen &  Naproxen. Tylenol would not cause reflux. If having symptoms ; food & drink should be avoided for @ least 2 hours before going to bed.  Wear a wrist splint overnight on the hand which is most affected. If this is effective; wear one bilaterally.   Go to [employer] M[employer] for information on Carpal Tunnel Syndrome. If symptoms persist or progress; nerve conduction/EMG studies would be indicated. If these were abnormal, Hand Surgery referral would be indicated.  The Neurology & stress test referrals will be scheduled and you'll be notified of the time.Please call the Referral Co-Ordinator @ [phone] if you have not been notified of appointment time within 7-10 days.

## 2014-01-21 NOTE — Progress Notes (Signed)
Pre visit review using our clinic review tool, if applicable. No additional management support is needed unless otherwise documented below in the visit note. 

## 2014-01-21 NOTE — Progress Notes (Signed)
   Subjective:    Patient ID: Renee Santos, female    DOB: 05/27/1945, 68 y.o.   MRN: [id number]  HPI   Over the Thanksgiving holiday and the week after she describes intermittent left chest pain which was sharp as well as dull and aching.  It seemed to be worse with deep breathing.  She felt she was having some dysphagia at this time as well.  She took her husband's reflux medicines with some benefit but it did come back subsequently.  She does not have the chest pain when she walks 3.5 miles over 45 minutes as is her regular CVE program.  She's also had some intermittent tingling in her fingers of both hands.   She describes poor sleep  and fatigue.   Review of Systems   The chest discomfort was not associated with nausea or diaphoresis  She's had no neck pain or radicular symptoms in the upper extremities.  She denies  weight loss, fever, chills, or sweats  She's had no bowel changes of melena or rectal bleeding  She has no paroxysmal nocturnal dyspnea, claudication, or exertional dyspnea.     Objective:   Physical Exam Appears healthy and well-nourished & in no acute distress  No carotid bruits are present.No neck vein distention present at 10 - 15 degrees. Thyroid normal to palpation  Heart rhythm and rate are normal with no gallop or murmur  Chest is clear with no increased work of breathing  There is no evidence of aortic aneurysm or renal artery bruits  Abdomen soft with no organomegaly or masses. No HJR  No clubbing, cyanosis or edema present.  Pedal pulses are intact   No ischemic skin changes are present . Fingernails healthy   Alert and oriented. Strength, tone, DTRs reflexes normal  Tinel's negative bilaterally.  Homan's negative          Assessment & Plan:  #1 atypical chest pain  #2 possible dysphagia, improved with PPI  #3 tingling in the fingers of the hands suggesting bilateral carpal tunnel syndrome  Plan: Stress test will be  performed  She's been asked to employ a wrist splint at night to see if this helps tingling the hands. Neurology referral for possible nerve conduction/EMG  Antireflux regimen recommended.

## 2014-02-11 DIAGNOSIS — M8580 Other specified disorders of bone density and structure, unspecified site: Secondary | ICD-10-CM

## 2014-02-11 HISTORY — DX: Other specified disorders of bone density and structure, unspecified site: M85.80

## 2014-02-17 ENCOUNTER — Telehealth: Payer: Self-pay | Admitting: Internal Medicine

## 2014-02-17 NOTE — Telephone Encounter (Signed)
Patient states her MyChart account is telling her she needs a shingles shot and tetanus shot. She would like to know if she does need these. Please advise pt. CB# [phone]

## 2014-02-17 NOTE — Telephone Encounter (Signed)
Called pt inform her yes she is due for tetanus far as shingle she need to contact her insurance to see if its covered. Schedule pt for tetanus 02/21/14. She will check on shingle with insurance...Renee Santos

## 2014-02-18 ENCOUNTER — Encounter (HOSPITAL_COMMUNITY): Payer: Self-pay | Admitting: *Deleted

## 2014-02-21 ENCOUNTER — Ambulatory Visit (INDEPENDENT_AMBULATORY_CARE_PROVIDER_SITE_OTHER): Payer: Medicare Other | Admitting: *Deleted

## 2014-02-21 DIAGNOSIS — Z23 Encounter for immunization: Secondary | ICD-10-CM

## 2014-03-02 ENCOUNTER — Telehealth (HOSPITAL_COMMUNITY): Payer: Self-pay

## 2014-03-02 NOTE — Telephone Encounter (Signed)
Encounter complete. 

## 2014-03-04 ENCOUNTER — Encounter (HOSPITAL_COMMUNITY): Payer: Self-pay

## 2014-03-14 ENCOUNTER — Other Ambulatory Visit: Payer: Self-pay | Admitting: Internal Medicine

## 2014-03-16 ENCOUNTER — Encounter: Payer: Self-pay | Admitting: Neurology

## 2014-03-16 ENCOUNTER — Ambulatory Visit (INDEPENDENT_AMBULATORY_CARE_PROVIDER_SITE_OTHER): Payer: Medicare Other | Admitting: Neurology

## 2014-03-16 VITALS — BP 118/84 | HR 62 | Ht 62.0 in | Wt 112.4 lb

## 2014-03-16 DIAGNOSIS — R202 Paresthesia of skin: Secondary | ICD-10-CM | POA: Diagnosis not present

## 2014-03-16 NOTE — Patient Instructions (Signed)
1.  Check vitamin B12 2.  Recommend wrist splint 3.  If symptoms worsen, will proceed with EMG 4.  Return to clinic as needed

## 2014-03-16 NOTE — Progress Notes (Addendum)
[facility] Clinic Note - Initial Visit   Date: 03/16/2014  Renee Santos MRN: [id number] DOB: [birth year]   Dear Dr. Linna Darner:   Thank you for your kind referral of Renee Santos for consultation of bilateral hand paresthesias. Although her history is well known to you, please allow [place] to reiterate it for the purpose of our medical record. The patient was accompanied to the clinic by self.   History of Present Illness: Renee Santos is a 69 y.o. right-handed Caucasian female with hypertension, hypothyroidism, and osteopenia presenting for evaluation of bilateral hand paresthesias.    Starting in early 2015, she developing tingling sensation over the tips of her hands.  Symptoms are intermittent and mostly noticeable when she is walking in the morning.  No weakness.  She also notices that her hands and feet are always cold in the morning, especially when she walks.  She usually walks about 3 miles in the morning.  She was recommended to use a wrist splint, but did not use it yet.  Denies any associated neck pain or numbness/tingling of the feet. She is planning on doing an 80-mile walk in [place] this summer .    Out-side paper records, electronic medical record, and images have been reviewed where available and summarized as:  Lab Results  Component Value Date   TSH 1.44 12/16/2013   Lab Results  Component Value Date   HGBA1C 5.4 04/13/2013    Past Medical History  Diagnosis Date  . Thyroid nodule   . Herpes zoster 2008    posterior thorax  . Neurofibroma of back 2013     Dr Amy Martinique   . Cancer 2011    Basal Cell of nose; Dr Martinique  . Hypertension   . Pleurisy without effusion 01/21/2012    resolved  with NSAIDS  . Psoriasis     Dr Amy Martinique  . Osteopenia 12/2012    T score -1.6 FRAX not calculated because of HRT    Past Surgical History  Procedure Laterality Date  . Colonoscopy  multiple  . Tonsillectomy    . Appendectomy    .  Oophorectomy      LSO for cyst  . Abdominal hysterectomy       LSO  & appendectomy for endometrioma   @ age38 for ovarian cyst     Medications:  Current Outpatient Prescriptions on File Prior to Visit  Medication Sig Dispense Refill  . Calcium Carbonate-Vit D-Min (CALTRATE PLUS PO) Take by mouth daily.      . diphenhydrAMINE (BENADRYL) 25 MG tablet Take 25 mg by mouth at bedtime as needed. To help sleep    . estradiol (VIVELLE-DOT) 0.05 MG/24HR patch Place 1 patch (0.05 mg total) onto the skin 2 (two) times a week. 24 patch 3  . folic acid (FOLVITE) 1 MG tablet Take 1 mg by mouth daily.      . hydrochlorothiazide (HYDRODIURIL) 25 MG tablet TAKE 1/2 BY MOUTH DAILY 45 tablet 1  . levothyroxine (SYNTHROID, LEVOTHROID) 50 MCG tablet Takes 1 tablet daily, except 1.5 tablets on tuesdays and thursdays 105 tablet 1  . metoprolol succinate (TOPROL-XL) 50 MG 24 hr tablet 1/2 by mouth daily 45 tablet 1   No current facility-administered medications on file prior to visit.    Allergies: No Known Allergies  Family History: Family History  Problem Relation Age of Onset  . Hypertension Father   . Lung cancer Father     remote smoker, deceased  74  . Hyperlipidemia Mother     CABG in late 32s  . Hypothyroidism Mother   . Macular degeneration Mother   . Hypertension Mother   . Coronary artery disease Mother     bypass surgery  . Stroke Mother 36    Deceased  . Coronary artery disease Maternal Grandmother   . Diabetes Neg Hx   . Heart attack Neg Hx   . Colon cancer Neg Hx   . Asthma Maternal Uncle     2  . Asthma Maternal Grandfather     Social History: History   Social History  . Marital Status: Married    Spouse Name: N/A    Number of Children: N/A  . Years of Education: N/A   Occupational History  . Not on file.   Social History Main Topics  . Smoking status: Never Smoker   . Smokeless tobacco: Never Used  . Alcohol Use: 2.4 oz/week    4 Glasses of wine per week      Comment: 3-6 drink per week  . Drug Use: No  . Sexual Activity: Yes    Birth Control/ Protection: Surgical     Comment: HYST   Other Topics Concern  . Not on file   Social History Narrative   Lives with husband x 45 years in a two story home.  Has 2 children.     Retired Education officer, museum in [phone].     Education: college.    Review of Systems:  CONSTITUTIONAL: No fevers, chills, night sweats, or weight loss.   EYES: No visual changes or eye pain ENT: No hearing changes.  No history of nose bleeds.   RESPIRATORY: No cough, wheezing and shortness of breath.   CARDIOVASCULAR: Negative for chest pain, and palpitations.   GI: Negative for abdominal discomfort, blood in stools or black stools.  No recent change in bowel habits.   GU:  No history of incontinence.   MUSCLOSKELETAL: No history of joint pain or swelling.  No myalgias.   SKIN: Negative for lesions, rash, and itching.   HEMATOLOGY/ONCOLOGY: Negative for prolonged bleeding, bruising easily, and swollen nodes.  No history of cancer.   ENDOCRINE: Negative for cold or heat intolerance, polydipsia or goiter.   PSYCH:  No depression or anxiety symptoms.   NEURO: As Above.   Vital Signs:  BP 118/84 mmHg  Pulse 62  Ht 5\' 2"  (1.575 m)  Wt 112 lb 7 oz (51.001 kg)  BMI 20.56 kg/m2  SpO2 99% Pain Scale: 0 on a scale of 0-10   General Medical Exam:   General:  Well appearing, comfortable.   Eyes/ENT: see cranial nerve examination.   Neck: No masses appreciated.  Full range of motion without tenderness.  No carotid bruits. Respiratory:  Clear to auscultation, good air entry bilaterally.   Cardiac:  Regular rate and rhythm, no murmur.   Extremities:  No deformities, edema, or skin discoloration.  Skin:  No rashes or lesions.  Neurological Exam: MENTAL STATUS including orientation to time, place, person, recent and remote memory, attention span and concentration, language, and fund of knowledge is normal.  Speech is not  dysarthric.  CRANIAL NERVES: II:  No visual field defects.  Unremarkable fundi.   III-IV-VI: Pupils equal round and reactive to light.  Normal conjugate, extra-ocular eye movements in all directions of gaze.  No nystagmus.  No ptosis.   V:  Normal facial sensation.     VII:  Normal facial symmetry and movements.  No pathologic facial reflexes.  VIII:  Normal hearing and vestibular function.   IX-X:  Normal palatal movement.   XI:  Normal shoulder shrug and head rotation.   XII:  Normal tongue strength and range of motion, no deviation or fasciculation.  MOTOR:  No atrophy, fasciculations or abnormal movements.  No pronator drift.  Tone is normal.    Right Upper Extremity:    Left Upper Extremity:    Deltoid  5/5   Deltoid  5/5   Biceps  5/5   Biceps  5/5   Triceps  5/5   Triceps  5/5   Wrist extensors  5/5   Wrist extensors  5/5   Wrist flexors  5/5   Wrist flexors  5/5   Finger extensors  5/5   Finger extensors  5/5   Finger flexors  5/5   Finger flexors  5/5   Dorsal interossei  5/5   Dorsal interossei  5/5   Abductor pollicis  5/5   Abductor pollicis  5/5   Tone (Ashworth scale)  0  Tone (Ashworth scale)  0   Right Lower Extremity:    Left Lower Extremity:    Hip flexors  5/5   Hip flexors  5/5   Hip extensors  5/5   Hip extensors  5/5   Knee flexors  5/5   Knee flexors  5/5   Knee extensors  5/5   Knee extensors  5/5   Dorsiflexors  5/5   Dorsiflexors  5/5   Plantarflexors  5/5   Plantarflexors  5/5   Toe extensors  5/5   Toe extensors  5/5   Toe flexors  5/5   Toe flexors  5/5   Tone (Ashworth scale)  0  Tone (Ashworth scale)  0   MSRs:  Right                                                                 Left brachioradialis 2+  brachioradialis 2+  biceps 2+  biceps 2+  triceps 2+  triceps 2+  patellar 2+  patellar 2+  ankle jerk 2+  ankle jerk 2+  Hoffman no  Hoffman no  plantar response down  plantar response down   SENSORY:  Normal and symmetric perception of  light touch, pinprick, vibration, and proprioception.  Romberg's sign absent.   COORDINATION/GAIT: Normal finger-to- nose-finger and heel-to-shin.  Intact rapid alternating movements bilaterally.  Able to rise from a chair without using arms.  Gait narrow based and stable. Tandem and stressed gait intact.    IMPRESSION: Renee Santos is a 68 year-old female presenting for bilateral hand paresthesias involving the tips of her fingers.  Her exam is entirely normal and non-focal. I reassured her that she does not have a worrisome neurological condition, but that it would be prudent to see how her symptoms evolve.    Being that carpal tunnel syndrome is most common manifestation of sensory disturbance of the hands, I agree with trial of wrist splint.  If symptoms do not improve, recommend EMG of the hands.  Will check vitamin B12 levels as this can also cause sensory changes.  Her TSH and HbA1c is within normal limits.    Return to clinic as needed.  The duration of this appointment visit was 40 minutes of face-to-face time with the patient.  Greater than 50% of this time was spent in counseling, explanation of diagnosis, planning of further management, and coordination of care.   Thank you for allowing me to participate in patient's care.  If I can answer any additional questions, I would be pleased to do so.    Sincerely,    Donika K. Posey Pronto, DO

## 2014-03-17 LAB — VITAMIN B12: VITAMIN B 12: 452 pg/mL (ref 211–911)

## 2014-03-18 ENCOUNTER — Telehealth (HOSPITAL_COMMUNITY): Payer: Self-pay

## 2014-03-18 ENCOUNTER — Ambulatory Visit: Payer: Medicare Other | Admitting: Neurology

## 2014-03-18 NOTE — Telephone Encounter (Signed)
Encounter complete. 

## 2014-03-23 ENCOUNTER — Ambulatory Visit (HOSPITAL_COMMUNITY)
Admission: RE | Admit: 2014-03-23 | Discharge: 2014-03-23 | Disposition: A | Discharge status: Home / Self Care | Payer: Medicare Other | Source: Ambulatory Visit | Attending: Internal Medicine | Admitting: Internal Medicine

## 2014-03-23 DIAGNOSIS — R079 Chest pain, unspecified: Secondary | ICD-10-CM | POA: Diagnosis present

## 2014-03-23 DIAGNOSIS — I493 Ventricular premature depolarization: Secondary | ICD-10-CM | POA: Diagnosis not present

## 2014-03-23 DIAGNOSIS — R0789 Other chest pain: Secondary | ICD-10-CM

## 2014-03-23 NOTE — Procedures (Signed)
Exercise Treadmill Test    Test  Exercise Tolerance Test Ordering MD: Unice Cobble, MD    Unique Test No: 1  Treadmill:  1  Indication for ETT: chest pain - rule out ischemia  Contraindication to ETT: No   Stress Modality: exercise - treadmill  Cardiac Imaging Performed: non   Protocol: standard Bruce - maximal  Max BP:  180/89  Max MPHR (bpm):  154 85% MPR (bpm):  130  MPHR obtained (bpm):  153 % MPHR obtained:  99  Reached 85% MPHR (min:sec):  6:15 Total Exercise Time (min-sec):  10  Workload in METS:  11.7 Borg Scale: 15  Reason ETT Terminated:  Leg Fatigue and SOB    ST Segment Analysis At Rest: normal ST segments - no evidence of significant ST depression With Exercise: <1 mm upsloping ST depression, isolated PVC's  Other Information Arrhythmia:  occasional PVC's Angina during ETT:  absent (0) Quality of ETT:  diagnostic  ETT Interpretation:  normal - no evidence of ischemia by ST analysis  Comments: No ischemic ST depression Scattered PVC's with exercise Normal BP response to exercise Excellent exercise tolerance  Pixie Casino, MD, [facility] Attending Cardiologist [place]

## 2014-05-23 ENCOUNTER — Telehealth: Payer: Self-pay | Admitting: *Deleted

## 2014-05-23 NOTE — Telephone Encounter (Signed)
Prior authorization form filled out and faxed to [facility] for vivelle-dot 0.05 mg patch, will wait for response.

## 2014-05-30 ENCOUNTER — Telehealth: Payer: Self-pay

## 2014-05-30 NOTE — Telephone Encounter (Signed)
Okay to use their estrogen generic patch 0.05 mg dose refill through November this year when she is due for her annual exam

## 2014-05-30 NOTE — Telephone Encounter (Signed)
Left message for patient to call me with pharmacy.

## 2014-05-30 NOTE — Telephone Encounter (Addendum)
Patient said that she now has [facility] and they denied covering the estradiol patch she was on. Even an appeal was denied because she has not tried any of the other medications on their list.  Patient said that there is an Estradiol TD Patch weekly that is covered. She talked more with the insurance company about it.  She said they will cover this and she would like to try Rx for this if you think it will be okay for her.

## 2014-06-01 MED ORDER — ESTRADIOL 0.05 MG/24HR TD PTWK: 0.0500 mg | MEDICATED_PATCH | TRANSDERMAL | Status: DC

## 2014-06-01 NOTE — Telephone Encounter (Signed)
I spoke with patient and Rx was sent to mail order

## 2014-06-01 NOTE — Telephone Encounter (Signed)
See 05/30/14 note kathy telephone encounter

## 2014-06-08 ENCOUNTER — Telehealth: Payer: Self-pay | Admitting: *Deleted

## 2014-06-08 NOTE — Telephone Encounter (Signed)
PA form filled out and faxed to [facility] for climara 0.5 mg patch, will wait for response.

## 2014-06-14 MED ORDER — ESTRADIOL 0.05 MG/24HR TD PTTW: 1.0000 | MEDICATED_PATCH | TRANSDERMAL | Status: DC

## 2014-06-14 NOTE — Telephone Encounter (Signed)
Pt medication was denied for the below alternative medication must have be tried/failed such as menest tablets, estropipate tablets, estradiol patch, estradiol tablets. Please advise

## 2014-06-14 NOTE — Telephone Encounter (Signed)
Pt aware Rx sent.  

## 2014-06-14 NOTE — Telephone Encounter (Signed)
Estradiol 0.05 mg patch okay to substitute.

## 2014-06-15 ENCOUNTER — Ambulatory Visit (INDEPENDENT_AMBULATORY_CARE_PROVIDER_SITE_OTHER): Payer: Medicare Other

## 2014-06-15 VITALS — BP 114/84 | Ht 62.0 in | Wt 109.8 lb

## 2014-06-15 DIAGNOSIS — Z23 Encounter for immunization: Secondary | ICD-10-CM | POA: Diagnosis not present

## 2014-06-15 DIAGNOSIS — Z0000 Encounter for general adult medical examination without abnormal findings: Secondary | ICD-10-CM

## 2014-06-15 NOTE — Patient Instructions (Addendum)
Ms. Bossard , Thank you for taking time to come for your Medicare Wellness Visit. I appreciate your ongoing commitment to your health goals. Please review the following plan we discussed and let me know if I can assist you in the future.   These are the goals we discussed: Goals    . <enter goal here>     Wants to continue to be healthy  Including walking; trips; add light weights;   Viniyoga; [email or web address] for stress reduction         This is a list of the screening recommended for you and due dates:  Health Maintenance  Topic Date Due  . Pneumonia vaccines (2 of 2 - PCV13) 04/14/2014  . Flu Shot  09/12/2014  . Mammogram  01/18/2016  . Colon Cancer Screening  12/31/2023  . Tetanus Vaccine  02/22/2024  . DEXA scan (bone density measurement)  Completed  . Shingles Vaccine  Completed   Plan Take Prevnar today Patient information on Kagel exercises; 20-30 x 3 a day  Will complete Dexa scan and mammogram in Nov/ Dec 2016.  Will attempt Kegel as educated;   Need for annual A1c/ lipids and other labs as appropriate; will schedule apt with Dr. Linna Darner for CPE.   Urinary Incontinence Urinary incontinence is the involuntary loss of urine from your bladder. CAUSES  There are many causes of urinary incontinence. They include:  Medicines.  Infections.  Prostatic enlargement, leading to overflow of urine from your bladder.  Surgery.  Neurological diseases.  Emotional factors. SIGNS AND SYMPTOMS Urinary Incontinence can be divided into four types: 1. Urge incontinence. Urge incontinence is the involuntary loss of urine before you have the opportunity to go to the bathroom. There is a sudden urge to void but not enough time to reach a bathroom. 2. Stress incontinence. Stress incontinence is the sudden loss of urine with any activity that forces urine to pass. It is commonly caused by anatomical changes to the pelvis and sphincter areas of your body. 3. Overflow  incontinence. Overflow incontinence is the loss of urine from an obstructed opening to your bladder. This results in a backup of urine and a resultant buildup of pressure within the bladder. When the pressure within the bladder exceeds the closing pressure of the sphincter, the urine overflows, which causes incontinence, similar to water overflowing a dam. 4. Total incontinence. Total incontinence is the loss of urine as a result of the inability to store urine within your bladder. DIAGNOSIS  Evaluating the cause of incontinence may require:  A thorough and complete medical and obstetric history.  A complete physical exam.  Laboratory tests such as a urine culture and sensitivities. When additional tests are indicated, they can include:  An ultrasound exam.  Kidney and bladder X-rays.  Cystoscopy. This is an exam of the bladder using a narrow scope.  Urodynamic testing to test the nerve function to the bladder and sphincter areas. TREATMENT  Treatment for urinary incontinence depends on the cause:  For urge incontinence caused by a bacterial infection, antibiotics will be prescribed. If the urge incontinence is related to medicines you take, your health care provider may have you change the medicine.  For stress incontinence, surgery to re-establish anatomical support to the bladder or sphincter, or both, will often correct the condition.  For overflow incontinence caused by an enlarged prostate, an operation to open the channel through the enlarged prostate will allow the flow of urine out of the bladder. In women with fibroids, a  hysterectomy may be recommended.  For total incontinence, surgery on your urinary sphincter may help. An artificial urinary sphincter (an inflatable cuff placed around the urethra) may be required. In women who have developed a hole-like passage between their bladder and vagina (vesicovaginal fistula), surgery to close the fistula often is required. HOME CARE  INSTRUCTIONS  Normal daily hygiene and the use of pads or adult diapers that are changed regularly will help prevent odors and skin damage.  Avoid caffeine. It can overstimulate your bladder.  Use the bathroom regularly. Try about every 2-3 hours to go to the bathroom, even if you do not feel the need to do so. Take time to empty your bladder completely. After urinating, wait a minute. Then try to urinate again.  For causes involving nerve dysfunction, keep a log of the medicines you take and a journal of the times you go to the bathroom. SEEK MEDICAL CARE IF:  You experience worsening of pain instead of improvement in pain after your procedure.  Your incontinence becomes worse instead of better. SEE IMMEDIATE MEDICAL CARE IF:  You experience fever or shaking chills.  You are unable to pass your urine.  You have redness spreading into your groin or down into your thighs. MAKE SURE YOU:   Understand these instructions.   Will watch your condition.  Will get help right away if you are not doing well or get worse. Document Released: 03/07/2004 Document Revised: 11/18/2012 Document Reviewed: 07/07/2012 [facility] Patient Information 2015 [place], [place]. This information is not intended to replace advice given to you by your health care provider. Make sure you discuss any questions you have with your health care provider. Osteoporosis Throughout your life, your body breaks down old bone and replaces it with new bone. As you get older, your body does not replace bone as quickly as it breaks it down. By the age of 66 years, most people begin to gradually lose bone because of the imbalance between bone loss and replacement. Some people lose more bone than others. Bone loss beyond a specified normal degree is considered osteoporosis.  Osteoporosis affects the strength and durability of your bones. The inside of the ends of your bones and your flat bones, like the bones of your pelvis, look  like honeycomb, filled with tiny open spaces. As bone loss occurs, your bones become less dense. This means that the open spaces inside your bones become bigger and the walls between these spaces become thinner. This makes your bones weaker. Bones of a person with osteoporosis can become so weak that they can break (fracture) during minor accidents, such as a simple fall. CAUSES  The following factors have been associated with the development of osteoporosis: Smoking. Drinking more than 2 alcoholic drinks several days per week. Long-term use of certain medicines: Corticosteroids. Chemotherapy medicines. Thyroid medicines. Antiepileptic medicines. Gonadal hormone suppression medicine. Immunosuppression medicine. Being underweight. Lack of physical activity. Lack of exposure to the sun. This can lead to vitamin D deficiency. Certain medical conditions: Certain inflammatory bowel diseases, such as Crohn disease and ulcerative colitis. Diabetes. Hyperthyroidism. Hyperparathyroidism. RISK FACTORS Anyone can develop osteoporosis. However, the following factors can increase your risk of developing osteoporosis: Gender--Women are at higher risk than men. Age--Being older than 50 years increases your risk. Ethnicity--White and Asian people have an increased risk. Weight --Being extremely underweight can increase your risk of osteoporosis. Family history of osteoporosis--Having a family member who has developed osteoporosis can increase your risk. SYMPTOMS  Usually, people with  osteoporosis have no symptoms.  DIAGNOSIS  Signs during a physical exam that may prompt your caregiver to suspect osteoporosis include: Decreased height. This is usually caused by the compression of the bones that form your spine (vertebrae) because they have weakened and become fractured. A curving or rounding of the upper back (kyphosis). To confirm signs of osteoporosis, your caregiver may request a procedure that  uses 2 low-dose X-ray beams with different levels of energy to measure your bone mineral density (dual-energy X-ray absorptiometry [DXA]). Also, your caregiver may check your level of vitamin D. TREATMENT  The goal of osteoporosis treatment is to strengthen bones in order to decrease the risk of bone fractures. There are different types of medicines available to help achieve this goal. Some of these medicines work by slowing the processes of bone loss. Some medicines work by increasing bone density. Treatment also involves making sure that your levels of calcium and vitamin D are adequate. PREVENTION  There are things you can do to help prevent osteoporosis. Adequate intake of calcium and vitamin D can help you achieve optimal bone mineral density. Regular exercise can also help, especially resistance and weight-bearing activities. If you smoke, quitting smoking is an important part of osteoporosis prevention. MAKE SURE YOU: Understand these instructions. Will watch your condition. Will get help right away if you are not doing well or get worse. FOR MORE INFORMATION [email or web address] and [email or web address] Document Released: 11/07/2004 Document Revised: 05/25/2012 Document Reviewed: 01/12/2011 [facility] Patient Information 2015 [place], [place]. This information is not intended to replace advice given to you by your health care provider. Make sure you discuss any questions you have with your health care provider. Kegel Exercises The goal of Kegel exercises is to isolate and exercise your pelvic floor muscles. These muscles act as a hammock that supports the rectum, vagina, small intestine, and uterus. As the muscles weaken, the hammock sags and these organs are displaced from their normal positions. Kegel exercises can strengthen your pelvic floor muscles and help you to improve bladder and bowel control, improve sexual response, and help reduce many problems and some discomfort during pregnancy. Kegel exercises  can be done anywhere and at any time. HOW TO PERFORM Edie your pelvic floor muscles. To do this, squeeze (contract) the muscles that you use when you try to stop the flow of urine. You will feel a tightness in the vaginal area (women) and a tight lift in the rectal area (men and women). When you begin, contract your pelvic muscles tight for 2-5 seconds, then relax them for 2-5 seconds. This is one set. Do 4-5 sets with a short pause in between. Contract your pelvic muscles for 8-10 seconds, then relax them for 8-10 seconds. Do 4-5 sets. If you cannot contract your pelvic muscles for 8-10 seconds, try 5-7 seconds and work your way up to 8-10 seconds. Your goal is 4-5 sets of 10 contractions each day. Keep your stomach, buttocks, and legs relaxed during the exercises. Perform sets of both short and long contractions. Vary your positions. Perform these contractions 3-4 times per day. Perform sets while you are:  Lying in bed in the morning. Standing at lunch. Sitting in the late afternoon. Lying in bed at night. You should do 40-50 contractions per day. Do not perform more Kegel exercises per day than recommended. Overexercising can cause muscle fatigue. Continue these exercises for for at least 15-20 weeks or as directed by your caregiver. Document Released: 01/15/2012 Document Reviewed: 01/15/2012 [employer] Patient Information  2015 [employer]. This information is not intended to replace advice given to you by your health care provider. Make sure you discuss any questions you have with your health care provider.  Health Maintenance Adopting a healthy lifestyle and getting preventive care can go a long way to promote health and wellness. Talk with your health care provider about what schedule of regular examinations is right for you. This is a good chance for you to check in with your provider about disease prevention and staying healthy. In between checkups, there are plenty of  things you can do on your own. Experts have done a lot of research about which lifestyle changes and preventive measures are most likely to keep you healthy. Ask your health care provider for more information. WEIGHT AND DIET  Eat a healthy diet Be sure to include plenty of vegetables, fruits, low-fat dairy products, and lean protein. Do not eat a lot of foods high in solid fats, added sugars, or salt. Get regular exercise. This is one of the most important things you can do for your health. Most adults should exercise for at least 150 minutes each week. The exercise should increase your heart rate and make you sweat (moderate-intensity exercise). Most adults should also do strengthening exercises at least twice a week. This is in addition to the moderate-intensity exercise.  Maintain a healthy weight Body mass index (BMI) is a measurement that can be used to identify possible weight problems. It estimates body fat based on height and weight. Your health care provider can help determine your BMI and help you achieve or maintain a healthy weight. For females 83 years of age and older:  A BMI below 18.5 is considered underweight. A BMI of 18.5 to 24.9 is normal. A BMI of 25 to 29.9 is considered overweight. A BMI of 30 and above is considered obese.  Watch levels of cholesterol and blood lipids You should start having your blood tested for lipids and cholesterol at 67 years of age, then have this test every 5 years. You may need to have your cholesterol levels checked more often if: Your lipid or cholesterol levels are high. You are older than 66 years of age. You are at high risk for heart disease.  CANCER SCREENING   Lung Cancer Lung cancer screening is recommended for adults 32-90 years old who are at high risk for lung cancer because of a history of smoking. A yearly low-dose CT scan of the lungs is recommended for people who: Currently smoke. Have quit within the past 15  years. Have at least a 30-pack-year history of smoking. A pack year is smoking an average of one pack of cigarettes a day for 1 year. Yearly screening should continue until it has been 15 years since you quit. Yearly screening should stop if you develop a health problem that would prevent you from having lung cancer treatment.  Breast Cancer Practice breast self-awareness. This means understanding how your breasts normally appear and feel. It also means doing regular breast self-exams. Let your health care provider know about any changes, no matter how small. If you are in your 20s or 30s, you should have a clinical breast exam (CBE) by a health care provider every 1-3 years as part of a regular health exam. If you are 34 or older, have a CBE every year. Also consider having a breast X-ray (mammogram) every year. If you have a family history of breast cancer, talk to your health care provider about  genetic screening. If you are at high risk for breast cancer, talk to your health care provider about having an MRI and a mammogram every year. Breast cancer gene (BRCA) assessment is recommended for women who have family members with BRCA-related cancers. BRCA-related cancers include: Breast. Ovarian. Tubal. Peritoneal cancers. Results of the assessment will determine the need for genetic counseling and BRCA1 and BRCA2 testing. Cervical Cancer Routine pelvic examinations to screen for cervical cancer are no longer recommended for nonpregnant women who are considered low risk for cancer of the pelvic organs (ovaries, uterus, and vagina) and who do not have symptoms. A pelvic examination may be necessary if you have symptoms including those associated with pelvic infections. Ask your health care provider if a screening pelvic exam is right for you.  The Pap test is the screening test for cervical cancer for women who are considered at risk. If you had a hysterectomy for a problem that was not cancer or  a condition that could lead to cancer, then you no longer need Pap tests. If you are older than 65 years, and you have had normal Pap tests for the past 10 years, you no longer need to have Pap tests. If you have had past treatment for cervical cancer or a condition that could lead to cancer, you need Pap tests and screening for cancer for at least 20 years after your treatment. If you no longer get a Pap test, assess your risk factors if they change (such as having a new sexual partner). This can affect whether you should start being screened again. Some women have medical problems that increase their chance of getting cervical cancer. If this is the case for you, your health care provider may recommend more frequent screening and Pap tests. The human papillomavirus (HPV) test is another test that may be used for cervical cancer screening. The HPV test looks for the virus that can cause cell changes in the cervix. The cells collected during the Pap test can be tested for HPV. The HPV test can be used to screen women 23 years of age and older. Getting tested for HPV can extend the interval between normal Pap tests from three to five years. An HPV test also should be used to screen women of any age who have unclear Pap test results. After 67 years of age, women should have HPV testing as often as Pap tests.  Colorectal Cancer This type of cancer can be detected and often prevented. Routine colorectal cancer screening usually begins at 67 years of age and continues through 67 years of age. Your health care provider may recommend screening at an earlier age if you have risk factors for colon cancer. Your health care provider may also recommend using home test kits to check for hidden blood in the stool. A small camera at the end of a tube can be used to examine your colon directly (sigmoidoscopy or colonoscopy). This is done to check for the earliest forms of colorectal cancer. Routine screening usually  begins at age 54. Direct examination of the colon should be repeated every 5-10 years through 67 years of age. However, you may need to be screened more often if early forms of precancerous polyps or small growths are found. Skin Cancer Check your skin from head to toe regularly. Tell your health care provider about any new moles or changes in moles, especially if there is a change in a mole's shape or color. Also tell your health care provider if  you have a mole that is larger than the size of a pencil eraser. Always use sunscreen. Apply sunscreen liberally and repeatedly throughout the day. Protect yourself by wearing long sleeves, pants, a wide-brimmed hat, and sunglasses whenever you are outside. HEART DISEASE, DIABETES, AND HIGH BLOOD PRESSURE  Have your blood pressure checked at least every 1-2 years. High blood pressure causes heart disease and increases the risk of stroke. If you are between 63 years and 11 years old, ask your health care provider if you should take aspirin to prevent strokes. Have regular diabetes screenings. This involves taking a blood sample to check your fasting blood sugar level. If you are at a normal weight and have a low risk for diabetes, have this test once every three years after 67 years of age. If you are overweight and have a high risk for diabetes, consider being tested at a younger age or more often. PREVENTING INFECTION  Hepatitis B If you have a higher risk for hepatitis B, you should be screened for this virus. You are considered at high risk for hepatitis B if: You were born in a country where hepatitis B is common. Ask your health care provider which countries are considered high risk. Your parents were born in a high-risk country, and you have not been immunized against hepatitis B (hepatitis B vaccine). You have HIV or AIDS. You use needles to inject street drugs. You live with someone who has hepatitis B. You have had sex with someone who has  hepatitis B. You get hemodialysis treatment. You take certain medicines for conditions, including cancer, organ transplantation, and autoimmune conditions. Hepatitis C Blood testing is recommended for: Everyone born from [birth year] through 1963. Anyone with known risk factors for hepatitis C. Sexually transmitted infections (STIs) You should be screened for sexually transmitted infections (STIs) including gonorrhea and chlamydia if: You are sexually active and are younger than 67 years of age. You are older than 67 years of age and your health care provider tells you that you are at risk for this type of infection. Your sexual activity has changed since you were last screened and you are at an increased risk for chlamydia or gonorrhea. Ask your health care provider if you are at risk. If you do not have HIV, but are at risk, it may be recommended that you take a prescription medicine daily to prevent HIV infection. This is called pre-exposure prophylaxis (PrEP). You are considered at risk if: You are sexually active and do not regularly use condoms or know the HIV status of your partner(s). You take drugs by injection. You are sexually active with a partner who has HIV. Talk with your health care provider about whether you are at high risk of being infected with HIV. If you choose to begin PrEP, you should first be tested for HIV. You should then be tested every 3 months for as long as you are taking PrEP.  PREGNANCY  If you are premenopausal and you may become pregnant, ask your health care provider about preconception counseling. If you may become pregnant, take 400 to 800 micrograms (mcg) of folic acid every day. If you want to prevent pregnancy, talk to your health care provider about birth control (contraception). OSTEOPOROSIS AND MENOPAUSE  Osteoporosis is a disease in which the bones lose minerals and strength with aging. This can result in serious bone fractures. Your risk for osteoporosis  can be identified using a bone density scan. If you are 45 years of age  or older, or if you are at risk for osteoporosis and fractures, ask your health care provider if you should be screened. Ask your health care provider whether you should take a calcium or vitamin D supplement to lower your risk for osteoporosis. Menopause may have certain physical symptoms and risks. Hormone replacement therapy may reduce some of these symptoms and risks. Talk to your health care provider about whether hormone replacement therapy is right for you.  HOME CARE INSTRUCTIONS  Schedule regular health, dental, and eye exams. Stay current with your immunizations.  Do not use any tobacco products including cigarettes, chewing tobacco, or electronic cigarettes. If you are pregnant, do not drink alcohol. If you are breastfeeding, limit how much and how often you drink alcohol. Limit alcohol intake to no more than 1 drink per day for nonpregnant women. One drink equals 12 ounces of beer, 5 ounces of wine, or 1 ounces of hard liquor. Do not use street drugs. Do not share needles. Ask your health care provider for help if you need support or information about quitting drugs. Tell your health care provider if you often feel depressed. Tell your health care provider if you have ever been abused or do not feel safe at home. Document Released: 08/13/2010 Document Revised: 06/14/2013 Document Reviewed: 12/30/2012 [facility] Patient Information 2015 [place], [place]. This information is not intended to replace advice given to you by your health care provider. Make sure you discuss any questions you have with your health care provider.

## 2014-06-15 NOTE — Progress Notes (Signed)
Subjective:   Renee Santos is a 69 y.o. female who presents for Medicare Annual (Subsequent) preventive examination.  Review of Systems:  HRA assessment completed during visit  Health better, the same or worse than last year? Better Current exercise:  Better because training for walk in [place]; increasing daily walks 3 miles; weekly between 6 and 11 miles Walk / 15 minute mile And one big hike (15 miles) later this week.  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 Never Smoker   Smokeless tobacco  . Never Used     Counseling given: Not Answered counseling n/a Past Medical History  Diagnosis Date  . Thyroid nodule   . Herpes zoster 2008    posterior thorax  . Neurofibroma of back 2013     Dr Amy Martinique   . Cancer 2011    Basal Cell of nose; Dr Martinique  . Hypertension   . Pleurisy without effusion 01/21/2012    resolved  with NSAIDS  . Psoriasis     Dr Amy Martinique  . Osteopenia 12/2012    T score -1.6 FRAX not calculated because of HRT   Past Surgical History  Procedure Laterality Date  . Colonoscopy  multiple  . Tonsillectomy    . Appendectomy      . Oophorectomy      LSO for cyst  . Abdominal hysterectomy       LSO  & appendectomy for endometrioma   @ age38 for ovarian cyst   Family History  Problem Relation Age of Onset  . Hypertension Father   . Lung cancer Father     remote smoker, deceased 2  . Hyperlipidemia Mother     CABG in late 43s  . Hypothyroidism Mother   . Macular degeneration Mother   . Hypertension Mother   . Coronary artery disease Mother     bypass surgery  . Stroke Mother 26    Deceased  . Coronary artery disease Maternal Grandmother   . Diabetes Neg Hx   . Heart attack Neg Hx   . Colon cancer Neg Hx   . Asthma Maternal Uncle     2  . Asthma Maternal Grandfather    History  Sexual Activity  . Sexual Activity: Yes  . Birth Control/ Protection: Surgical    Comment: HYST    Outpatient Encounter Prescriptions as of 06/15/2014  Medication Sig  . Calcium Carbonate-Vit D-Min (CALTRATE PLUS PO) Take by mouth daily.    . diphenhydrAMINE (BENADRYL) 25 MG tablet Take 25 mg by mouth at bedtime as needed. To  help sleep  . estradiol (VIVELLE-DOT) 0.05 MG/24HR patch Place 1 patch (0.05 mg total) onto the skin 2 (two) times a week.  . hydrochlorothiazide (HYDRODIURIL) 25 MG tablet TAKE 1/2 BY MOUTH DAILY  . levothyroxine (SYNTHROID, LEVOTHROID) 50 MCG tablet Takes 1 tablet daily, except 1.5 tablets on tuesdays and thursdays  . metoprolol succinate (TOPROL-XL) 50 MG 24 hr tablet 1/2 by mouth daily  . ZOSTAVAX 33295 UNT/0.65ML injection   . [DISCONTINUED] estradiol (CLIMARA - DOSED IN MG/24 HR) 0.05 mg/24hr patch Place 1 patch (0.05 mg total) onto the skin once a week.  . [DISCONTINUED] folic acid (FOLVITE) 1 MG tablet Take 1 mg by mouth daily.     No facility-administered encounter medications on file as of 06/15/2014.    Activities of Daily Living No flowsheet data found.  Patient Care Team: Hendricks Limes, MD as PCP - General    Assessment:    CC risk: obesity (BMI); family hx;  HTN;     Understand  risk and lifestyle choices than can impede health;   Personalized Education given regarding: Stress reduction;   Pt determined a personalized goal; Reviewed goal and added intermittent light weights to activity; as well as viniyoga.   Fall Risk and general Safety reviewed    Educated on prevention falls; no risk determined Comfortable shoes Regular vision checks; reviewed necessity of annual visits; will have cataracts evaluated in the fall of this year post trip to [place].  Bone density scan as appropriate; completed in Nov of 2014; will schedule with mammogram Nov of this year  Calcium and Vit D as appropriate/ taking both; discussed 1200mg  of calcium per day either by supplement or food; cheese; milk; yogurt  Reviewed for medication compliance and barriers; none identified;  Stress: Recommendations for managing stress if assessed as a factor; States once in awhile she will feel anxious/ tried [place] but did not help; No issues identified regarding depression   Risk for hepatitis or high risk social behavior via hepatitis screen /none risk identified  Risk for Depression; several years ago; situational and resolved   Safety assessed (driving issues; vision; home environment; support and environmental safety)  Cognition assessed by AD8; Score 0 (A score of 2 or greater would indicate the MMSE be completed)    Need for Immunizations or other screenings identified;  (CDC recommmend Prevnar at 65 followed by pnuemovax 23 in one year or 5 years after the last dose.  Health Maintenance up to date and a Preventive Wellness Plan was given to the patient  Exercise Activities and Dietary recommendations   See Patient plan Goal is to remain healthy; Loves to walk and walks 3 miles a day on average; 6 to 11 miles one day a week and 15 miles periodically as she trains for walk in [place] in June  Educated on adding viniyoga to routine for relaxation;  Add light weights to prevent  osteoporosis    Goals    None     Fall Risk Fall Risk  04/13/2013  Falls in the past year? No   Depression Screen PHQ 2/9 Scores 04/13/2013  PHQ - 2 Score 0     Cognitive Testing No flowsheet data found.  Immunization History  Administered Date(s) Administered  . H1N1 03/01/2008  . Influenza Split 12/04/2010, 11/05/2011  . Influenza,inj,Quad PF,36+ Mos 12/03/2012, 12/15/2013  . Pneumococcal Polysaccharide-23 04/13/2013  . Tetanus 02/21/2014  . Zoster 02/18/2014   Screening Tests Health Maintenance  Topic Date Due  . PNA vac  Low Risk Adult (2 of 2 - PCV13) 04/14/2014  . INFLUENZA VACCINE  09/12/2014  . MAMMOGRAM  01/18/2016  . COLONOSCOPY  12/31/2023  . TETANUS/TDAP  02/22/2024  . DEXA SCAN  Completed  . ZOSTAVAX  Completed      Plan:     Plan   The patient agrees to:  1. Take Prevnar at this visit 2. Will fup with apt with Dr. Linna Darner for labs and routine physical. 3. Will have mammogram and Dexa scan in Nov of this year. 4. Will review viniyoga for relaxation and minor anxiousness.  Advanced directive: Completed    During the course of the visit the patient was educated and counseled about the following appropriate screening and preventive services:   Vaccines to include Pneumoccal, Influenza, Hepatitis B, Td, Zostavax, HCV/ screened   Prevnar given today  Electrocardiogram- deferred  Cardiovascular Disease: HTN; activity and diet is good; Patient feels most comfortable with weight at 110. BMI still 19;   Educated on eating protein as well as healthy carbs to train. The patient does not c/o of energy reduction of fatigue.  Colorectal cancer screening; completed 2015  Bone density screening; Nov 2014; to have another one this year  Diabetes screening; to repeat A1c at annual physical  Glaucoma screening; eye apt completed  Mammography/PAP; pap completed in 01/2014/ to have mammogram in Nov of this year.  Nutrition counseling /reviewed    Patient Instructions (the written plan) was given to the patient.   Fup with Dr. Linna Darner regarding labs and routine physical Kegel exercises explained and will attempt; c/o of urinary leaking due to walking; GYN following. Prevnar VIS given   QASUO,RVIFB, RN  06/15/2014

## 2014-06-28 NOTE — Progress Notes (Signed)
   Subjective:    Patient ID: Renee Santos, female    DOB: 1945/12/18, 69 y.o.   MRN: [id number]  HPI    Review of Systems     Objective:   Physical Exam        Assessment & Plan:  Medical screening examination/treatment/procedure(s) were performed by non-physician practitioner and as supervising physician I was immediately available for consultation/collaboration. I agree with above. Unice Cobble, MD

## 2014-07-07 ENCOUNTER — Other Ambulatory Visit: Payer: Self-pay | Admitting: Internal Medicine

## 2014-12-19 LAB — HM DEXA SCAN

## 2014-12-20 ENCOUNTER — Encounter: Payer: Medicare Other | Admitting: Internal Medicine

## 2014-12-21 ENCOUNTER — Ambulatory Visit (INDEPENDENT_AMBULATORY_CARE_PROVIDER_SITE_OTHER): Payer: Medicare Other | Admitting: Internal Medicine

## 2014-12-21 ENCOUNTER — Encounter: Payer: Self-pay | Admitting: Internal Medicine

## 2014-12-21 VITALS — BP 120/80 | HR 71 | Temp 97.7°F | Resp 12 | Ht 62.0 in | Wt 104.4 lb

## 2014-12-21 DIAGNOSIS — Z23 Encounter for immunization: Secondary | ICD-10-CM

## 2014-12-21 DIAGNOSIS — I10 Essential (primary) hypertension: Secondary | ICD-10-CM | POA: Diagnosis not present

## 2014-12-21 DIAGNOSIS — E038 Other specified hypothyroidism: Secondary | ICD-10-CM

## 2014-12-21 DIAGNOSIS — M8580 Other specified disorders of bone density and structure, unspecified site: Secondary | ICD-10-CM

## 2014-12-21 DIAGNOSIS — E782 Mixed hyperlipidemia: Secondary | ICD-10-CM | POA: Diagnosis not present

## 2014-12-21 MED ORDER — LEVOTHYROXINE SODIUM 50 MCG PO TABS: ORAL_TABLET | ORAL | Status: DC

## 2014-12-21 MED ORDER — HYDROCHLOROTHIAZIDE 25 MG PO TABS: ORAL_TABLET | ORAL | Status: DC

## 2014-12-21 MED ORDER — METOPROLOL SUCCINATE ER 50 MG PO TB24: ORAL_TABLET | ORAL | Status: DC

## 2014-12-21 NOTE — Assessment & Plan Note (Signed)
TSH [place]

## 2014-12-21 NOTE — Progress Notes (Signed)
   Subjective:    Patient ID: Renee Santos, female    DOB: 1945-12-08, 69 y.o.   MRN: [id number]  HPI The patient is here to assess status of active health conditions.  PMH, FH, & Social History reviewed & updated.No change in [place] as recorded.   She has been compliant with her medications without adverse effects. She is on a heart healthy, low-salt diet. She typically will have up to 8 glasses of wine per week. She has not smoked since college. She walks at least 3 miles a day without cardiopulmonary symptoms.  Colonoscopy was performed in November 2015. No polyps were present. She has no active GI symptoms.  Review of systems is negative except for some blurred vision related to cataracts for which she will have surgery in the near future.   Review of Systems  Chest pain, palpitations, tachycardia, exertional dyspnea, paroxysmal nocturnal dyspnea, claudication or edema are absent. No unexplained weight loss, abdominal pain, significant dyspepsia, dysphagia, melena, rectal bleeding, or persistently small caliber stools. Dysuria, pyuria, hematuria, frequency, nocturia or polyuria are denied. Change in hair, skin, nails denied. No bowel changes of constipation or diarrhea. No intolerance to heat or cold.     Objective:   Physical Exam  Pertinent or positive findings include: She has minor crepitus of the knees.  General appearance :Thin but adequately nourished; in no distress.  Eyes: No conjunctival inflammation or scleral icterus is present.  Oral exam:  Lips and gums are healthy appearing.There is no oropharyngeal erythema or exudate noted. Dental hygiene is good.  Heart:  Normal rate and regular rhythm. S1 and S2 normal without gallop, murmur, click, rub or other extra sounds    Lungs:Chest clear to auscultation; no wheezes, rhonchi,rales ,or rubs present.No increased work of breathing.   Abdomen: bowel sounds normal, soft and non-tender without masses, organomegaly or hernias  noted.  No guarding or rebound. No flank tenderness to percussion.  Vascular : all pulses equal ; no bruits present.  Skin:Warm & dry.  Intact without suspicious lesions or rashes ; no tenting or jaundice   Lymphatic: No lymphadenopathy is noted about the head, neck, axilla, or inguinal areas.   Neuro: Strength, tone & DTRs normal.         Assessment & Plan:  See Current Assessment & Plan in Problem List under specific Diagnosis

## 2014-12-21 NOTE — Progress Notes (Signed)
Pre visit review using our clinic review tool, if applicable. No additional management support is needed unless otherwise documented below in the visit note. 

## 2014-12-21 NOTE — Assessment & Plan Note (Signed)
Blood pressure goals reviewed. BMET 

## 2014-12-21 NOTE — Patient Instructions (Signed)
  Your next office appointment will be determined based upon review of your pending labs  and  BMD  Those written interpretation of the lab results and instructions will be transmitted to you by My Chart    Critical results will be called.   Followup as needed for any active or acute issue. Please report any significant change in your symptoms.

## 2014-12-21 NOTE — Assessment & Plan Note (Signed)
BMD results pending

## 2014-12-21 NOTE — Assessment & Plan Note (Signed)
Lipids, LFTs, TSH  

## 2014-12-21 NOTE — Assessment & Plan Note (Signed)
TSH 

## 2014-12-22 ENCOUNTER — Other Ambulatory Visit (INDEPENDENT_AMBULATORY_CARE_PROVIDER_SITE_OTHER): Payer: Medicare Other

## 2014-12-22 DIAGNOSIS — E782 Mixed hyperlipidemia: Secondary | ICD-10-CM | POA: Diagnosis not present

## 2014-12-22 DIAGNOSIS — M8580 Other specified disorders of bone density and structure, unspecified site: Secondary | ICD-10-CM | POA: Diagnosis not present

## 2014-12-22 DIAGNOSIS — E038 Other specified hypothyroidism: Secondary | ICD-10-CM

## 2014-12-22 DIAGNOSIS — I10 Essential (primary) hypertension: Secondary | ICD-10-CM

## 2014-12-22 LAB — CBC WITH DIFFERENTIAL/PLATELET
BASOS PCT: 1.1 % (ref 0.0–3.0)
Basophils Absolute: 0.1 10*3/uL (ref 0.0–0.1)
EOS PCT: 1 % (ref 0.0–5.0)
Eosinophils Absolute: 0 10*3/uL (ref 0.0–0.7)
HCT: 38.7 % (ref 36.0–46.0)
HEMOGLOBIN: 13.2 g/dL (ref 12.0–15.0)
LYMPHS PCT: 30.2 % (ref 12.0–46.0)
Lymphs Abs: 1.4 10*3/uL (ref 0.7–4.0)
MCHC: 34.1 g/dL (ref 30.0–36.0)
MCV: 94.6 fl (ref 78.0–100.0)
MONOS PCT: 9.4 % (ref 3.0–12.0)
Monocytes Absolute: 0.4 10*3/uL (ref 0.1–1.0)
NEUTROS ABS: 2.7 10*3/uL (ref 1.4–7.7)
Neutrophils Relative %: 58.3 % (ref 43.0–77.0)
Platelets: 230 10*3/uL (ref 150.0–400.0)
RBC: 4.09 Mil/uL (ref 3.87–5.11)
RDW: 12.1 % (ref 11.5–15.5)
WBC: 4.6 10*3/uL (ref 4.0–10.5)

## 2014-12-22 LAB — HEPATIC FUNCTION PANEL
ALK PHOS: 50 U/L (ref 39–117)
ALT: 10 U/L (ref 0–35)
AST: 17 U/L (ref 0–37)
Albumin: 3.9 g/dL (ref 3.5–5.2)
BILIRUBIN DIRECT: 0.1 mg/dL (ref 0.0–0.3)
Total Bilirubin: 0.6 mg/dL (ref 0.2–1.2)
Total Protein: 6.7 g/dL (ref 6.0–8.3)

## 2014-12-22 LAB — BASIC METABOLIC PANEL
BUN: 16 mg/dL (ref 6–23)
CHLORIDE: 104 meq/L (ref 96–112)
CO2: 31 meq/L (ref 19–32)
Calcium: 9.5 mg/dL (ref 8.4–10.5)
Creatinine, Ser: 1.06 mg/dL (ref 0.40–1.20)
GFR: 54.85 mL/min — ABNORMAL LOW (ref >60.00)
Glucose, Bld: 93 mg/dL (ref 70–99)
POTASSIUM: 4.2 meq/L (ref 3.5–5.1)
Sodium: 140 mEq/L (ref 135–145)

## 2014-12-22 LAB — LIPID PANEL
CHOLESTEROL: 223 mg/dL — AB (ref 0–200)
HDL: 91.4 mg/dL (ref >39.00)
LDL CALC: 119 mg/dL — AB (ref 0–99)
NonHDL: 131.38
TRIGLYCERIDES: 63 mg/dL (ref 0.0–149.0)
Total CHOL/HDL Ratio: 2
VLDL: 12.6 mg/dL (ref 0.0–40.0)

## 2014-12-22 LAB — TSH: TSH: 1.76 u[IU]/mL (ref 0.35–4.50)

## 2014-12-22 LAB — VITAMIN D 25 HYDROXY (VIT D DEFICIENCY, FRACTURES): VITD: 53.29 ng/mL (ref 30.00–100.00)

## 2014-12-23 ENCOUNTER — Encounter: Payer: Self-pay | Admitting: Gynecology

## 2014-12-27 ENCOUNTER — Encounter: Payer: Self-pay | Admitting: Internal Medicine

## 2014-12-29 ENCOUNTER — Encounter: Payer: Self-pay | Admitting: Internal Medicine

## 2014-12-30 ENCOUNTER — Other Ambulatory Visit: Payer: Self-pay | Admitting: Emergency Medicine

## 2014-12-30 MED ORDER — LORAZEPAM 0.5 MG PO TABS: 0.5000 mg | ORAL_TABLET | Freq: Two times a day (BID) | ORAL | Status: DC | PRN

## 2014-12-30 NOTE — Telephone Encounter (Signed)
Refill Request for Lorazepam sent to [facility] on file. Spoke with pt to inform.

## 2015-01-16 ENCOUNTER — Encounter: Payer: Self-pay | Admitting: Gynecology

## 2015-01-16 ENCOUNTER — Ambulatory Visit (INDEPENDENT_AMBULATORY_CARE_PROVIDER_SITE_OTHER): Payer: Medicare Other | Admitting: Gynecology

## 2015-01-16 VITALS — BP 118/76 | Ht 61.0 in | Wt 105.0 lb

## 2015-01-16 DIAGNOSIS — N952 Postmenopausal atrophic vaginitis: Secondary | ICD-10-CM | POA: Diagnosis not present

## 2015-01-16 DIAGNOSIS — Z79890 Hormone replacement therapy (postmenopausal): Secondary | ICD-10-CM

## 2015-01-16 DIAGNOSIS — Z01419 Encounter for gynecological examination (general) (routine) without abnormal findings: Secondary | ICD-10-CM

## 2015-01-16 MED ORDER — ESTRADIOL 0.5 MG PO TABS: 0.5000 mg | ORAL_TABLET | Freq: Every day | ORAL | Status: DC

## 2015-01-16 NOTE — Patient Instructions (Signed)

## 2015-01-16 NOTE — Progress Notes (Signed)
[place] Jun 23, 1945 [id number]        69 y.o.  G2P2  No LMP recorded. Patient has had a hysterectomy. for breast and pelvic exam.  Past medical history,surgical history, problem list, medications, allergies, family history and social history were all reviewed and documented as reviewed in the EPIC chart.  ROS:  Performed with pertinent positives and negatives included in the history, assessment and plan.   Additional significant findings :  none   Exam: Kim Counsellor Vitals:   01/16/15 0936  BP: 118/76  Height: 5\' 1"  (1.549 m)  Weight: 105 lb (47.628 kg)   General appearance:  Normal affect, orientation and appearance. Skin: Grossly normal HEENT: Without gross lesions.  No cervical or supraclavicular adenopathy. Thyroid normal.  Lungs:  Clear without wheezing, rales or rhonchi Cardiac: RR, without RMG Abdominal:  Soft, nontender, without masses, guarding, rebound, organomegaly or hernia Breasts:  Examined lying and sitting without masses, retractions, discharge or axillary adenopathy. Pelvic:  Ext/BUS/vagina with atrophic changes  Adnexa  Without masses or tenderness    Anus and perineum  Normal   Rectovaginal  Normal sphincter tone without palpated masses or tenderness.    Assessment/Plan:  69 y.o. G2P2 female for breast and pelvic exam.   1. Postmenopausal/atrophic genital changes/HRT/history of TAH LSO for endometriosis. Patient continues on estradiol 0.05 mg patches. Wants to discuss alternatives because the patches are very expensive. Reviewed the whole issue of HRT and timing as far as weaning. Current recommendations for lowest dose shortest period of time discussed. Risks to include stroke heart attack DVT possible breast cancer reviewed. Increased risk with oral dose over patch also discussed. After lengthy discussion patient wants to continue on ERT and wants to go with oral estradiol 0.5 mg daily. #90 with 4 refills provided. Follow up if any  issues. 2. Mammography scheduled this month. SBE monthly reviewed. 3. Osteopenia. DEXA 2016 T score -1.6 stable from prior DEXA. 4. Colonoscopy 2015. Repeat at their recommended interval. 5. Pap smear 2015. No Pap smear done today. No history of significant abnormal Pap smears. Options to stop screening altogether as she is status post hysterectomy for benign indications and over the age of 30 reviewed. Will readdress on an annual basis. 6. Health maintenance. No routine lab work done as patient does this through her primary physician's office. Follow up 1 year, sooner as needed.   Anastasio Auerbach MD, 9:57 AM 01/16/2015

## 2015-01-18 ENCOUNTER — Telehealth: Payer: Self-pay | Admitting: *Deleted

## 2015-01-18 NOTE — Telephone Encounter (Signed)
Prior authorization for estradiol 0.5 mg has been approved via blue medicare x 1 year Dec 2017

## 2015-05-01 ENCOUNTER — Ambulatory Visit (INDEPENDENT_AMBULATORY_CARE_PROVIDER_SITE_OTHER): Payer: Medicare Other | Admitting: Internal Medicine

## 2015-05-01 ENCOUNTER — Encounter: Payer: Self-pay | Admitting: Internal Medicine

## 2015-05-01 VITALS — BP 110/70 | HR 68 | Temp 98.1°F | Resp 16 | Wt 109.0 lb

## 2015-05-01 DIAGNOSIS — L408 Other psoriasis: Secondary | ICD-10-CM | POA: Diagnosis not present

## 2015-05-01 DIAGNOSIS — I10 Essential (primary) hypertension: Secondary | ICD-10-CM

## 2015-05-01 DIAGNOSIS — B009 Herpesviral infection, unspecified: Secondary | ICD-10-CM

## 2015-05-01 DIAGNOSIS — F418 Other specified anxiety disorders: Secondary | ICD-10-CM | POA: Insufficient documentation

## 2015-05-01 DIAGNOSIS — E038 Other specified hypothyroidism: Secondary | ICD-10-CM | POA: Diagnosis not present

## 2015-05-01 DIAGNOSIS — E041 Nontoxic single thyroid nodule: Secondary | ICD-10-CM

## 2015-05-01 DIAGNOSIS — M65241 Calcific tendinitis, right hand: Secondary | ICD-10-CM | POA: Insufficient documentation

## 2015-05-01 MED ORDER — METOPROLOL SUCCINATE ER 50 MG PO TB24: ORAL_TABLET | ORAL | Status: DC

## 2015-05-01 MED ORDER — HYDROCHLOROTHIAZIDE 25 MG PO TABS: ORAL_TABLET | ORAL | Status: DC

## 2015-05-01 MED ORDER — CLOBETASOL PROPIONATE 0.05 % EX CREA: 1.0000 "application " | TOPICAL_CREAM | Freq: Two times a day (BID) | CUTANEOUS | Status: AC

## 2015-05-01 MED ORDER — LEVOTHYROXINE SODIUM 50 MCG PO TABS: ORAL_TABLET | ORAL | Status: DC

## 2015-05-01 NOTE — Progress Notes (Signed)
Subjective:    Patient ID: Renee Santos, female    DOB: November 09, 1945, 70 y.o.   MRN: [id number]  HPI She is here to establish with a new pcp.    Right hand : She has an area on her right hand she wants me to look at.  There is no pain or swelling.   Psoriasis:  She has psoriasis and has seen derm in the past.  She was using a prescription steroid cream, but ran out a while ago.  She would like to get a prescription if possible. She has several spots in different parts of her body.  otc steroid cream is not helping.   Difficulty sleeping:  She has been taking benadryl nightly, but after I met with her husband and I have discussed with him that that is not a good medication to take nightly.  She is trying to discontinue or decrease her use. She has not been taking it daily. Some nights she does not sleep well, but will continue to try to avoid it.  Hypothyroidism:  She is taking her medication daily.  She denies any recent changes in energy or weight that are unexplained.   Hypertension: She is taking her medication daily. She is compliant with a low sodium diet.  She denies chest pain, palpitations, edema, shortness of breath and regular headaches. She is exercising regularly.      Medications and allergies reviewed with patient and updated if appropriate.  Patient Active Problem List   Diagnosis Date Noted  . Essential hypertension 03/01/2008  . Osteopenia 03/01/2008  . THYROID NODULE 01/13/2007  . Hypothyroidism 01/13/2007  . HYPERLIPIDEMIA 01/13/2007  . PSORIASIS 01/13/2007    Current Outpatient Prescriptions on File Prior to Visit  Medication Sig Dispense Refill  . Calcium Carbonate-Vit D-Min (CALTRATE PLUS PO) Take by mouth daily.      . diphenhydrAMINE (BENADRYL) 25 MG tablet Take 25 mg by mouth at bedtime as needed. To help sleep    . estradiol (ESTRACE) 0.5 MG tablet Take 1 tablet (0.5 mg total) by mouth daily. (Patient taking differently: Take 0.5 mg by mouth. Takes 1  tablet 3 days a week.) 90 tablet 4  . hydrochlorothiazide (HYDRODIURIL) 25 MG tablet TAKE 1/2 BY MOUTH DAILY 45 tablet 3  . levothyroxine (SYNTHROID, LEVOTHROID) 50 MCG tablet Takes 1 tablet daily, except 1.5 tablets on tuesdays and thursdays 96 tablet 3  . LORazepam (ATIVAN) 0.5 MG tablet Take 1 tablet (0.5 mg total) by mouth 2 (two) times daily as needed for anxiety. 30 tablet 0  . metoprolol succinate (TOPROL-XL) 50 MG 24 hr tablet 1/2 by mouth daily 45 tablet 3   No current facility-administered medications on file prior to visit.    Past Medical History  Diagnosis Date  . Thyroid nodule   . Herpes zoster 2008    posterior thorax  . Neurofibroma of back 2013     Dr Amy Martinique   . Cancer [facility]) 2011    Basal Cell of nose; Dr Martinique  . Hypertension   . Pleurisy without effusion 01/21/2012    resolved  with NSAIDS  . Psoriasis     Dr Amy Martinique  . Osteopenia 2016    T score -1.6 FRAX not calculated because of HRT    Past Surgical History  Procedure Laterality Date  . Colonoscopy  multiple  . Tonsillectomy    . Appendectomy    . Oophorectomy      LSO for cyst  .  Abdominal hysterectomy       LSO  & appendectomy for endometrioma   @ age38 for ovarian cyst    Social History   Social History  . Marital Status: Married    Spouse Name: N/A  . Number of Children: N/A  . Years of Education: N/A   Social History Main Topics  . Smoking status: Former Research scientist (life sciences)  . Smokeless tobacco: Never Used     Comment: college onlysocially  . Alcohol Use: 1.8 oz/week    3 Glasses of wine per week     Comment: average of 3 glasses wine week/ not a daily drinker  . Drug Use: No  . Sexual Activity: Yes    Birth Control/ Protection: Surgical     Comment: HYST-1st intercourse 70 yo-Fewer than 5 partners   Other Topics Concern  . Not on file   Social History Narrative   Lives with husband x 45 years in a two story home.  Has 2 children.     Retired Education officer, museum in [phone].     Education:  college.    Family History  Problem Relation Age of Onset  . Hypertension Father   . Lung cancer Father     remote smoker, deceased 46  . Hyperlipidemia Mother     CABG in late 17s  . Hypothyroidism Mother   . Macular degeneration Mother   . Hypertension Mother   . Coronary artery disease Mother     bypass surgery  . Stroke Mother 66    Deceased  . Coronary artery disease Maternal Grandmother   . Diabetes Neg Hx   . Heart attack Neg Hx   . Colon cancer Neg Hx   . Asthma Maternal Uncle     2  . Asthma Maternal Grandfather     Review of Systems  Constitutional: Negative for fever, fatigue and unexpected weight change.  Respiratory: Negative for cough, shortness of breath and wheezing.   Cardiovascular: Negative for chest pain, palpitations and leg swelling.  Neurological: Negative for dizziness, light-headedness and headaches.       Objective:   Filed Vitals:   05/01/15 1418  BP: 110/70  Pulse: 68  Temp: 98.1 F (36.7 C)  Resp: 16   Filed Weights   05/01/15 1418  Weight: 109 lb (49.442 kg)   Body mass index is 20.61 kg/(m^2).   Physical Exam Constitutional: Appears well-developed and well-nourished. No distress.  Neck: Neck supple. No tracheal deviation present. No thyromegaly present.  No carotid bruit. No cervical adenopathy.   Cardiovascular: Normal rate, regular rhythm and normal heart sounds.   No murmur heard.  No edema Pulmonary/Chest: Effort normal and breath sounds normal. No respiratory distress. No wheezes.       Assessment & Plan:   See Problem List for Assessment and Plan of chronic medical problems.  Follow up annually

## 2015-05-01 NOTE — Assessment & Plan Note (Signed)
Asymptomatic - just monitor for now

## 2015-05-01 NOTE — Assessment & Plan Note (Signed)
Last tsh in normal range Continue current dose

## 2015-05-01 NOTE — Assessment & Plan Note (Signed)
BP well controlled Current regimen effective and well tolerated Continue current medications at current doses  

## 2015-05-01 NOTE — Assessment & Plan Note (Addendum)
US done 11/15 -  No new nodules, dominant nodules stable since 2009 Recheck an [place] in a few years - given stability no need to recheck now Last tsh in normal range

## 2015-05-01 NOTE — Patient Instructions (Signed)
   All other Health Maintenance issues reviewed.   All recommended immunizations and age-appropriate screenings are up-to-date.  No immunizations administered today.   Medications reviewed and updated.  No changes recommended at this time.  A prescription steroid cream was sent to your pharmacy.   Your prescription(s) have been submitted to your pharmacy. Please take as directed and contact our office if you believe you are having problem(s) with the medication(s).   Please followup annually

## 2015-05-01 NOTE — Assessment & Plan Note (Signed)
Clobetasol cream

## 2015-05-01 NOTE — Progress Notes (Signed)
Pre visit review using our clinic review tool, if applicable. No additional management support is needed unless otherwise documented below in the visit note. 

## 2015-05-01 NOTE — Assessment & Plan Note (Signed)
Takes ativan only as needed - very rare

## 2015-06-23 ENCOUNTER — Telehealth: Payer: Self-pay

## 2015-06-23 NOTE — Telephone Encounter (Signed)
Call to Ms. Sakai to schedule AWV; LVM to call back; fup on walk in [place]

## 2015-07-03 NOTE — Telephone Encounter (Signed)
Call to Ms. Bisono to schedule AWV this year; Apt for June 13 at 8am. Had another question but MD calling; Will call back

## 2015-07-25 ENCOUNTER — Telehealth: Payer: Self-pay

## 2015-07-25 ENCOUNTER — Ambulatory Visit (INDEPENDENT_AMBULATORY_CARE_PROVIDER_SITE_OTHER): Payer: Medicare Other

## 2015-07-25 VITALS — BP 126/80 | HR 60 | Ht 62.0 in | Wt 103.2 lb

## 2015-07-25 DIAGNOSIS — M21612 Bunion of left foot: Principal | ICD-10-CM

## 2015-07-25 DIAGNOSIS — Z0000 Encounter for general adult medical examination without abnormal findings: Secondary | ICD-10-CM

## 2015-07-25 DIAGNOSIS — Z7289 Other problems related to lifestyle: Secondary | ICD-10-CM

## 2015-07-25 DIAGNOSIS — Z1159 Encounter for screening for other viral diseases: Secondary | ICD-10-CM

## 2015-07-25 DIAGNOSIS — M21611 Bunion of right foot: Secondary | ICD-10-CM

## 2015-07-25 NOTE — Telephone Encounter (Addendum)
Referral for podiatry ordered.  Routine physical lab work and hep c ordered - can be done prior to CPE in November

## 2015-07-25 NOTE — Progress Notes (Addendum)
Subjective:   Renee Santos is a 70 y.o. female who presents for Medicare Annual (Subsequent) preventive examination.  Review of Systems:   Cardiac Risk Factors include: advanced age (>14men, >70 women);dyslipidemia;family history of premature cardiovascular disease;hypertension (GFR 50's; ) HRA assessment completed during visit;   The Patient was informed that the wellness visit is to identify their future health risk and educate them on measures that can chose to take to reduce risk for increased disease through the lifespan.   Father had HTN, Lung cancer Mother had Hyperlipidemia; Mac degeneration; CAD; Stroke   NO ROS; Medicare Wellness Visit Last OV in March 2017 with Dr. Quay Burow Labs completed: 12/22/2014 (GFR 54) BP well controled;    Lifestyle review and risk: HTN and Osteopenia Lipids ratio 2; Trig 63 and HDL 91 A1c is 5.4   Psychosocial: Lives with spouse; 2 children SW in the 60's   Tobacco: no  ETOH: couple glasses of wine most evenings; will start to moderate;   Medications; Mentioned Lorazepam; ordered in Nov of 16; takes occasionally but doesn't seem to help. Will discuss with Dr. Quay Burow if she feels she needs to renew this rx or try other med.  BMI: 18  Diet;   Breakfast; (walks in am) bowl of cereal after she walks; Protein drink as well.  Lunch; having a pack of nabs and coke 0; will plan to change this  Supper; cooks; baked chicken; mixed vegetables; more fish in the summer;    Exercise; Completed walk last year [place].  Walked 26 miles the 1st day;  Walk to where you were staying;  Current: [place] and [place] 3 miles 5 days and sometimes 6 days   HOME SAFETY; multi level / step down to family room; basement Watching bending over since her spouse had emergency back surgery Looking around and may scale down; investigating; simplifying  Fall hx; no  Fall risk: Fear of falling? no Given education on "Fall Prevention  in the Home" for more safety tips the patient can apply as appropriate.  Long term goal is to "age in place" but may buy smaller home with less up keep making traveling easier;   Community safety; YES Smoke detectors YES Firearms safety reviewed and will keep in a safe place if these exist. Driving accidents; no;  Wears seatbelt Advised to use sun protection or wear a hat  Stressors (1-5) it depends; mostly 1; but can move up on some days; Helps with grand children;  4 grand children; 20 yo twins 6yo and 5yo;   Depression: Denies feeling depressed or hopeless; voices pleasure in daily life  Cognitive; memory issues related to stress; recall; Very organized  Manages checkbook, medications; no failures of task Ad8 score reviewed for issues;  Issues making decisions; no  Less interest in hobbies / activities" no  Repeats questions, stories; family complaining: NO  Trouble using ordinary gadgets; microwave; computer: no  Forgets the month or year: no  Mismanaging finances: no  Missing apt: no but does write them down  Daily problems with thinking of memory NO Ad8 score is 0    Gait assessment appears normal;   Mobilization and Functional losses from last year to this year? No  Sleep pattern changes; no or manageable;   Urinary or fecal incontinence reviewed: no/ trouble with walking; train herself to go to the bathroom more often   _______________________________________________________________________________  ASSESSMENT:  Counseling Health Maintenance Gaps: Hep C/ educated and will order  for next blood draw Colonoscopy; 12/2013 and due 12/2023  EKG: 01/2014 Mammogram: 01/2014/ 01/2016; completed at [facility] by Dr. Phineas Real Dexa/ 12/2014 -1.70 hip (Vit D was 56) also ordered by Dr. Phineas Real PAP: per Dr. Phineas Real  Hearing:  Right ear 4000hz   Left ear 4000hz   Ophthalmology exam Vision:  Dec 2016; bilateral; wears readers / wears contacts   Immunizations Due:  none due at this time  Advanced Directive addressed; Completed  Individual Goal: to continue to travel; stay in great shape; will start with weight training. Has trained in the past; will start low and go slow   Health Recommendations and Referrals Will continue to follow up with Dr. Phineas Real;  Has requested to see a podiatrist; bunions and other questions; will fup Dr. Quay Burow  Barriers to Success None noted;    Current Care Team reviewed and updated   Education provided and lifestyle risk discussed   All Health Maintenance Gaps Reviewed for closure       Objective:     Vitals: BP 126/80 mmHg  Pulse 60  Ht 5\' 2"  (1.575 m)  Wt 103 lb 4 oz (46.834 kg)  BMI 18.88 kg/m2  SpO2 98%  Body mass index is 18.88 kg/(m^2).   Tobacco History  Smoking status  . Former Smoker  Smokeless tobacco  . Never Used    Comment: college onlysocially     Counseling given: Not Answered   Past Medical History  Diagnosis Date  . Thyroid nodule   . Herpes zoster 2008    posterior thorax  . Neurofibroma of back 2013     Dr Amy Martinique   . Cancer [facility]) 2011    Basal Cell of nose; Dr Martinique  . Hypertension   . Pleurisy without effusion 01/21/2012    resolved  with NSAIDS  . Psoriasis     Dr Amy Martinique  . Osteopenia 2016    T score -1.6 FRAX not calculated because of HRT   Past Surgical History  Procedure Laterality Date  . Colonoscopy  multiple  . Tonsillectomy    . Appendectomy    . Oophorectomy      LSO for cyst  . Abdominal hysterectomy       LSO  & appendectomy for endometrioma   @ age38 for ovarian cyst  . Cataract Bilateral Dec 2016;    not sure of date; wears readers only    Family History  Problem Relation Age of Onset  . Hypertension Father   . Lung cancer Father     remote smoker, deceased 64  . Hyperlipidemia Mother     CABG in late 36s  . Hypothyroidism Mother   . Macular degeneration Mother   . Hypertension Mother   . Coronary artery disease Mother      bypass surgery  . Stroke Mother 60    Deceased  . Coronary artery disease Maternal Grandmother   . Diabetes Neg Hx   . Heart attack Neg Hx   . Colon cancer Neg Hx   . Asthma Maternal Uncle     2  . Asthma Maternal Grandfather    History  Sexual Activity  . Sexual Activity: Yes  . Birth Control/ Protection: Surgical    Comment: HYST-1st intercourse 70 yo-Fewer than 5 partners    Outpatient Encounter Prescriptions as of 07/25/2015  Medication Sig  . Calcium Carbonate-Vit D-Min (CALTRATE PLUS PO) Take by mouth daily.    . clobetasol cream (TEMOVATE) [id number] % Apply 1 application topically 2 (two)  times daily.  . diphenhydrAMINE (BENADRYL) 25 MG tablet Take 25 mg by mouth at bedtime as needed. To help sleep  . estradiol (ESTRACE) 0.5 MG tablet Take 1 tablet (0.5 mg total) by mouth daily. (Patient taking differently: Take 0.5 mg by mouth. Takes 1 tablet 3 days a week.)  . hydrochlorothiazide (HYDRODIURIL) 25 MG tablet TAKE 1/2 BY MOUTH DAILY  . levothyroxine (SYNTHROID, LEVOTHROID) 50 MCG tablet Takes 1 tablet daily, except 1.5 tablets on tuesdays and thursdays  . LORazepam (ATIVAN) 0.5 MG tablet Take 1 tablet (0.5 mg total) by mouth 2 (two) times daily as needed for anxiety.  . metoprolol succinate (TOPROL-XL) 50 MG 24 hr tablet 1/2 by mouth daily   No facility-administered encounter medications on file as of 07/25/2015.    Activities of Daily Living In your present state of health, do you have any difficulty performing the following activities: 07/25/2015  Hearing? N  Vision? N  Difficulty concentrating or making decisions? N  Walking or climbing stairs? N  Dressing or bathing? N  Doing errands, shopping? N  Preparing Food and eating ? N  Using the Toilet? N  In the past six months, have you accidently leaked urine? N  Do you have problems with loss of bowel control? N  Managing your Medications? N  Managing your Finances? N  Housekeeping or managing your Housekeeping? N     Patient Care Team: Binnie Rail, MD as PCP - General (Internal Medicine) Amy Martinique, MD as Consulting Physician (Dermatology) Anastasio Auerbach, MD as Consulting Physician (Gynecology)    Assessment:     Exercise Activities and Dietary recommendations Current Exercise Habits: Home exercise routine, Type of exercise: walking;strength training/weights, Time (Minutes): 60, Frequency (Times/Week): 6, Weekly Exercise (Minutes/Week): 360, Intensity: Moderate  Goals    . <enter goal here>     Wants to continue to be healthy  Including walking; trips; add light weights;   Viniyoga; [email or web address] for stress reduction      . patient     Goal is to travel again; plan a trip; Add weights; low and slow;       Fall Risk Fall Risk  07/25/2015 06/15/2014 04/13/2013  Falls in the past year? No No No   Depression Screen PHQ 2/9 Scores 07/25/2015 06/15/2014 04/13/2013  PHQ - 2 Score 0 0 0     Cognitive Testing No flowsheet data found.  No issues; AD8 score 0   Immunization History  Administered Date(s) Administered  . H1N1 03/01/2008  . Influenza Split 12/04/2010, 11/05/2011  . Influenza,inj,Quad PF,36+ Mos 12/03/2012, 12/15/2013, 12/21/2014  . Pneumococcal Conjugate-13 06/15/2014  . Pneumococcal Polysaccharide-23 04/13/2013  . Tetanus 02/21/2014  . Zoster 02/18/2014   Screening Tests Health Maintenance  Topic Date Due  . Hepatitis C Screening  May 11, 1945  . INFLUENZA VACCINE  09/12/2015  . MAMMOGRAM  01/18/2016  . COLONOSCOPY  12/31/2023  . TETANUS/TDAP  02/22/2024  . DEXA SCAN  Completed  . ZOSTAVAX  Completed  . PNA vac Low Risk Adult  Completed      Plan:    Will have Hep C drawn at next blood draw  Will send Dr. Quay Burow email regarding podiatry referral and when she would like labs repeated; Last labs draw 12/2014; next office visit March for fup  Continuing to exercise; good social support; May scale down home eventually.  During the course of the visit the  patient was educated and counseled about the following appropriate screening and preventive services:   Vaccines  to include Pneumoccal, Influenza, Hepatitis B, Td, Zostavax, HCV / none due  Electrocardiogram/ 01/2014/ Exercise tolerance test 03/2014  Cardiovascular Disease/ BP in good control  Colorectal cancer screening/ screening due 10 years;  12/2023  Bone density screening/ osteopenic; Vit D adequate; To start weight bearing exercise   Diabetes screening/ neg  Glaucoma screening/ annual eye exam; s/p cataracts extraction bil  Mammography/PAP/ 12/ 2017 Dr. Phineas Real  Nutrition counseling / good overall; will work on not eating nabs and diet coke for lunch  Patient Instructions (the written plan) was given to the patient.   Wynetta Fines RN  07/25/2015     Medical screening examination/treatment/procedure(s) were performed by non-physician practitioner and as supervising physician I was immediately available for consultation/collaboration. I agree with above. Binnie Rail, MD

## 2015-07-25 NOTE — Patient Instructions (Addendum)
Renee Santos , Thank you for taking time to come for your Medicare Wellness Visit. I appreciate your ongoing commitment to your health goals. Please review the following plan we discussed and let me know if I can assist you in the future.   Will discuss possible referral to Podiatry with Dr. Quay Burow  Will order Hep C     These are the goals we discussed: Goals    . <enter goal here>     Wants to continue to be healthy  Including walking; trips; add light weights;   Viniyoga; [email or web address] for stress reduction      . patient     Goal is to travel again; plan a trip; Add weights; low and slow;        This is a list of the screening recommended for you and due dates:  Health Maintenance  Topic Date Due  .  Hepatitis C: One time screening is recommended by Center for Disease Control  (CDC) for  adults born from [birth year] through 1963.   1946/01/14  . Flu Shot  09/12/2015  . Mammogram  01/18/2016  . Colon Cancer Screening  12/31/2023  . Tetanus Vaccine  02/22/2024  . DEXA scan (bone density measurement)  Completed  . Shingles Vaccine  Completed  . Pneumonia vaccines  Completed     Fall Prevention in the Home  Falls can cause injuries. They can happen to people of all ages. There are many things you can do to make your home safe and to help prevent falls.  WHAT CAN I DO ON THE OUTSIDE OF MY HOME?  Regularly fix the edges of walkways and driveways and fix any cracks.  Remove anything that might make you trip as you walk through a door, such as a raised step or threshold.  Trim any bushes or trees on the path to your home.  Use bright outdoor lighting.  Clear any walking paths of anything that might make someone trip, such as rocks or tools.  Regularly check to see if handrails are loose or broken. Make sure that both sides of any steps have handrails.  Any raised decks and porches should have guardrails on the edges.  Have any leaves, snow, or ice cleared  regularly.  Use sand or salt on walking paths during winter.  Clean up any spills in your garage right away. This includes oil or grease spills. WHAT CAN I DO IN THE BATHROOM?   Use night lights.  Install grab bars by the toilet and in the tub and shower. Do not use towel bars as grab bars.  Use non-skid mats or decals in the tub or shower.  If you need to sit down in the shower, use a plastic, non-slip stool.  Keep the floor dry. Clean up any water that spills on the floor as soon as it happens.  Remove soap buildup in the tub or shower regularly.  Attach bath mats securely with double-sided non-slip rug tape.  Do not have throw rugs and other things on the floor that can make you trip. WHAT CAN I DO IN THE BEDROOM?  Use night lights.  Make sure that you have a light by your bed that is easy to reach.  Do not use any sheets or blankets that are too big for your bed. They should not hang down onto the floor.  Have a firm chair that has side arms. You can use this for support while you get dressed.  Do not have  throw rugs and other things on the floor that can make you trip. WHAT CAN I DO IN THE KITCHEN?  Clean up any spills right away.  Avoid walking on wet floors.  Keep items that you use a lot in easy-to-reach places.  If you need to reach something above you, use a strong step stool that has a grab bar.  Keep electrical cords out of the way.  Do not use floor polish or wax that makes floors slippery. If you must use wax, use non-skid floor wax.  Do not have throw rugs and other things on the floor that can make you trip. WHAT CAN I DO WITH MY STAIRS?  Do not leave any items on the stairs.  Make sure that there are handrails on both sides of the stairs and use them. Fix handrails that are broken or loose. Make sure that handrails are as long as the stairways.  Check any carpeting to make sure that it is firmly attached to the stairs. Fix any carpet that is  loose or worn.  Avoid having throw rugs at the top or bottom of the stairs. If you do have throw rugs, attach them to the floor with carpet tape.  Make sure that you have a light switch at the top of the stairs and the bottom of the stairs. If you do not have them, ask someone to add them for you. WHAT ELSE CAN I DO TO HELP PREVENT FALLS?  Wear shoes that:  Do not have high heels.  Have rubber bottoms.  Are comfortable and fit you well.  Are closed at the toe. Do not wear sandals.  If you use a stepladder:  Make sure that it is fully opened. Do not climb a closed stepladder.  Make sure that both sides of the stepladder are locked into place.  Ask someone to hold it for you, if possible.  Clearly mark and make sure that you can see:  Any grab bars or handrails.  First and last steps.  Where the edge of each step is.  Use tools that help you move around (mobility aids) if they are needed. These include:  Canes.  Walkers.  Scooters.  Crutches.  Turn on the lights when you go into a dark area. Replace any light bulbs as soon as they burn out.  Set up your furniture so you have a clear path. Avoid moving your furniture around.  If any of your floors are uneven, fix them.  If there are any pets around you, be aware of where they are.  Review your medicines with your doctor. Some medicines can make you feel dizzy. This can increase your chance of falling. Ask your doctor what other things that you can do to help prevent falls.   This information is not intended to replace advice given to you by your health care provider. Make sure you discuss any questions you have with your health care provider.   Document Released: 11/24/2008 Document Revised: 06/14/2014 Document Reviewed: 03/04/2014 Elsevier Interactive Patient Education 2016 [place] Maintenance, Female Adopting a healthy lifestyle and getting preventive care can go a long way to promote health  and wellness. Talk with your health care provider about what schedule of regular examinations is right for you. This is a good chance for you to check in with your provider about disease prevention and staying healthy. In between checkups, there are plenty of things you can do on your own. Experts have done  a lot of research about which lifestyle changes and preventive measures are most likely to keep you healthy. Ask your health care provider for more information. WEIGHT AND DIET  Eat a healthy diet  Be sure to include plenty of vegetables, fruits, low-fat dairy products, and lean protein.  Do not eat a lot of foods high in solid fats, added sugars, or salt.  Get regular exercise. This is one of the most important things you can do for your health.  Most adults should exercise for at least 150 minutes each week. The exercise should increase your heart rate and make you sweat (moderate-intensity exercise).  Most adults should also do strengthening exercises at least twice a week. This is in addition to the moderate-intensity exercise.  Maintain a healthy weight  Body mass index (BMI) is a measurement that can be used to identify possible weight problems. It estimates body fat based on height and weight. Your health care provider can help determine your BMI and help you achieve or maintain a healthy weight.  For females 34 years of age and older:   A BMI below 18.5 is considered underweight.  A BMI of 18.5 to 24.9 is normal.  A BMI of 25 to 29.9 is considered overweight.  A BMI of 30 and above is considered obese.  Watch levels of cholesterol and blood lipids  You should start having your blood tested for lipids and cholesterol at 68 years of age, then have this test every 5 years.  You may need to have your cholesterol levels checked more often if:  Your lipid or cholesterol levels are high.  You are older than 68 years of age.  You are at high risk for heart disease.   CANCER SCREENING   Lung Cancer  Lung cancer screening is recommended for adults 92-16 years old who are at high risk for lung cancer because of a history of smoking.  A yearly low-dose CT scan of the lungs is recommended for people who:  Currently smoke.  Have quit within the past 15 years.  Have at least a 30-pack-year history of smoking. A pack year is smoking an average of one pack of cigarettes a day for 1 year.  Yearly screening should continue until it has been 15 years since you quit.  Yearly screening should stop if you develop a health problem that would prevent you from having lung cancer treatment.  Breast Cancer  Practice breast self-awareness. This means understanding how your breasts normally appear and feel.  It also means doing regular breast self-exams. Let your health care provider know about any changes, no matter how small.  If you are in your 20s or 30s, you should have a clinical breast exam (CBE) by a health care provider every 1-3 years as part of a regular health exam.  If you are 54 or older, have a CBE every year. Also consider having a breast X-ray (mammogram) every year.  If you have a family history of breast cancer, talk to your health care provider about genetic screening.  If you are at high risk for breast cancer, talk to your health care provider about having an MRI and a mammogram every year.  Breast cancer gene (BRCA) assessment is recommended for women who have family members with BRCA-related cancers. BRCA-related cancers include:  Breast.  Ovarian.  Tubal.  Peritoneal cancers.  Results of the assessment will determine the need for genetic counseling and BRCA1 and BRCA2 testing. Cervical Cancer Your health care  provider may recommend that you be screened regularly for cancer of the pelvic organs (ovaries, uterus, and vagina). This screening involves a pelvic examination, including checking for microscopic changes to the surface of  your cervix (Pap test). You may be encouraged to have this screening done every 3 years, beginning at age 86.  For women ages 49-65, health care providers may recommend pelvic exams and Pap testing every 3 years, or they may recommend the Pap and pelvic exam, combined with testing for human papilloma virus (HPV), every 5 years. Some types of HPV increase your risk of cervical cancer. Testing for HPV may also be done on women of any age with unclear Pap test results.  Other health care providers may not recommend any screening for nonpregnant women who are considered low risk for pelvic cancer and who do not have symptoms. Ask your health care provider if a screening pelvic exam is right for you.  If you have had past treatment for cervical cancer or a condition that could lead to cancer, you need Pap tests and screening for cancer for at least 20 years after your treatment. If Pap tests have been discontinued, your risk factors (such as having a new sexual partner) need to be reassessed to determine if screening should resume. Some women have medical problems that increase the chance of getting cervical cancer. In these cases, your health care provider may recommend more frequent screening and Pap tests. Colorectal Cancer  This type of cancer can be detected and often prevented.  Routine colorectal cancer screening usually begins at 68 years of age and continues through 68 years of age.  Your health care provider may recommend screening at an earlier age if you have risk factors for colon cancer.  Your health care provider may also recommend using home test kits to check for hidden blood in the stool.  A small camera at the end of a tube can be used to examine your colon directly (sigmoidoscopy or colonoscopy). This is done to check for the earliest forms of colorectal cancer.  Routine screening usually begins at age 47.  Direct examination of the colon should be repeated every 5-10 years  through 68 years of age. However, you may need to be screened more often if early forms of precancerous polyps or small growths are found. Skin Cancer  Check your skin from head to toe regularly.  Tell your health care provider about any new moles or changes in moles, especially if there is a change in a mole's shape or color.  Also tell your health care provider if you have a mole that is larger than the size of a pencil eraser.  Always use sunscreen. Apply sunscreen liberally and repeatedly throughout the day.  Protect yourself by wearing long sleeves, pants, a wide-brimmed hat, and sunglasses whenever you are outside. HEART DISEASE, DIABETES, AND HIGH BLOOD PRESSURE   High blood pressure causes heart disease and increases the risk of stroke. High blood pressure is more likely to develop in:  People who have blood pressure in the high end of the normal range (130-139/85-89 mm Hg).  People who are overweight or obese.  People who are African American.  If you are 42-72 years of age, have your blood pressure checked every 3-5 years. If you are 77 years of age or older, have your blood pressure checked every year. You should have your blood pressure measured twice--once when you are at a hospital or clinic, and once when you are  not at a hospital or clinic. Record the average of the two measurements. To check your blood pressure when you are not at a hospital or clinic, you can use:  An automated blood pressure machine at a pharmacy.  A home blood pressure monitor.  If you are between 46 years and 53 years old, ask your health care provider if you should take aspirin to prevent strokes.  Have regular diabetes screenings. This involves taking a blood sample to check your fasting blood sugar level.  If you are at a normal weight and have a low risk for diabetes, have this test once every three years after 68 years of age.  If you are overweight and have a high risk for diabetes,  consider being tested at a younger age or more often. PREVENTING INFECTION  Hepatitis B  If you have a higher risk for hepatitis B, you should be screened for this virus. You are considered at high risk for hepatitis B if:  You were born in a country where hepatitis B is common. Ask your health care provider which countries are considered high risk.  Your parents were born in a high-risk country, and you have not been immunized against hepatitis B (hepatitis B vaccine).  You have HIV or AIDS.  You use needles to inject street drugs.  You live with someone who has hepatitis B.  You have had sex with someone who has hepatitis B.  You get hemodialysis treatment.  You take certain medicines for conditions, including cancer, organ transplantation, and autoimmune conditions. Hepatitis C  Blood testing is recommended for:  Everyone born from [birth year] through 1963.  Anyone with known risk factors for hepatitis C. Sexually transmitted infections (STIs)  You should be screened for sexually transmitted infections (STIs) including gonorrhea and chlamydia if:  You are sexually active and are younger than 68 years of age.  You are older than 67 years of age and your health care provider tells you that you are at risk for this type of infection.  Your sexual activity has changed since you were last screened and you are at an increased risk for chlamydia or gonorrhea. Ask your health care provider if you are at risk.  If you do not have HIV, but are at risk, it may be recommended that you take a prescription medicine daily to prevent HIV infection. This is called pre-exposure prophylaxis (PrEP). You are considered at risk if:  You are sexually active and do not regularly use condoms or know the HIV status of your partner(s).  You take drugs by injection.  You are sexually active with a partner who has HIV. Talk with your health care provider about whether you are at high risk of being  infected with HIV. If you choose to begin PrEP, you should first be tested for HIV. You should then be tested every 3 months for as long as you are taking PrEP.  PREGNANCY   If you are premenopausal and you may become pregnant, ask your health care provider about preconception counseling.  If you may become pregnant, take 400 to 800 micrograms (mcg) of folic acid every day.  If you want to prevent pregnancy, talk to your health care provider about birth control (contraception). OSTEOPOROSIS AND MENOPAUSE   Osteoporosis is a disease in which the bones lose minerals and strength with aging. This can result in serious bone fractures. Your risk for osteoporosis can be identified using a bone density scan.  If you are 71  years of age or older, or if you are at risk for osteoporosis and fractures, ask your health care provider if you should be screened.  Ask your health care provider whether you should take a calcium or vitamin D supplement to lower your risk for osteoporosis.  Menopause may have certain physical symptoms and risks.  Hormone replacement therapy may reduce some of these symptoms and risks. Talk to your health care provider about whether hormone replacement therapy is right for you.  HOME CARE INSTRUCTIONS   Schedule regular health, dental, and eye exams.  Stay current with your immunizations.   Do not use any tobacco products including cigarettes, chewing tobacco, or electronic cigarettes.  If you are pregnant, do not drink alcohol.  If you are breastfeeding, limit how much and how often you drink alcohol.  Limit alcohol intake to no more than 1 drink per day for nonpregnant women. One drink equals 12 ounces of beer, 5 ounces of wine, or 1 ounces of hard liquor.  Do not use street drugs.  Do not share needles.  Ask your health care provider for help if you need support or information about quitting drugs.  Tell your health care provider if you often feel  depressed.  Tell your health care provider if you have ever been abused or do not feel safe at home.   This information is not intended to replace advice given to you by your health care provider. Make sure you discuss any questions you have with your health care provider.   Document Released: 08/13/2010 Document Revised: 02/18/2014 Document Reviewed: 12/30/2012 Elsevier Interactive Patient Education [employer].

## 2015-07-25 NOTE — Addendum Note (Signed)
Addended by: Binnie Rail on: 07/25/2015 08:42 PM   Modules accepted: Orders

## 2015-07-25 NOTE — Telephone Encounter (Signed)
Fup to [facility] completed 06/13; Requested a referral to Podiatry. No particular issues; early bunions but wants to keep her feet healthy.  Also, Scheduled for next fup with Dr. Quay Burow in March 2017, but blood work was done in Nov 2016. GFR 54; otherwise normal; Needs hep c drawn. Please advise if you would recommend blood work prior to march?   Thanks, Sh

## 2015-07-27 NOTE — Telephone Encounter (Signed)
Spoke with pt's husband to inform.  

## 2015-08-23 ENCOUNTER — Ambulatory Visit (INDEPENDENT_AMBULATORY_CARE_PROVIDER_SITE_OTHER): Payer: Medicare Other | Admitting: Podiatry

## 2015-08-23 ENCOUNTER — Encounter: Payer: Self-pay | Admitting: Podiatry

## 2015-08-23 ENCOUNTER — Ambulatory Visit (INDEPENDENT_AMBULATORY_CARE_PROVIDER_SITE_OTHER): Payer: Medicare Other

## 2015-08-23 VITALS — BP 126/76 | HR 56 | Resp 16

## 2015-08-23 DIAGNOSIS — M722 Plantar fascial fibromatosis: Secondary | ICD-10-CM

## 2015-08-23 DIAGNOSIS — M21619 Bunion of unspecified foot: Secondary | ICD-10-CM

## 2015-08-23 NOTE — Progress Notes (Signed)
Subjective:     Patient ID: Renee Santos, female   DOB: 05-06-1945, 70 y.o.   MRN: [id number]  HPI patient presents stating that she is started to develop bunions she has pain in the right forefoot and she loves to height and is concerned about what she'll be able to do   Review of Systems  All other systems reviewed and are negative.      Objective:   Physical Exam  Constitutional: She is oriented to person, place, and time.  Cardiovascular: Intact distal pulses.   Musculoskeletal: Normal range of motion.  Neurological: She is oriented to person, place, and time.  Skin: Skin is warm.  Nursing note and vitals reviewed.  neurovascular status intact muscle strength adequate range of motion within normal limits with patient noted to have hyperostosis medial aspect first metatarsal and bilateral and some swelling and irritation around the fourth metatarsophalangeal joint right that comes and goes. Patient's found have good digital perfusion is well oriented with no range of motion loss and does have moderate depression of the arch     Assessment:     Inherited condition with moderate structural bunion deformity inflammatory capsulitis and discomfort in general with her feet with excessive ambulation but she is able to walk numerous miles without significant pain    Plan:     H&P x-rays reviewed and condition discussed including education on forefoot capsulitis and metatarsal pads dispensed. Today I went ahead and I scanned for custom orthotics to reduce pressure on her feet and we will see her back again in the next 3 weeks to dispense  X-ray report indicates moderate structural bunion deformity right over left with no indications of arthritis or stress fracture

## 2015-08-23 NOTE — Progress Notes (Signed)
   Subjective:    Patient ID: Renee Santos, female    DOB: 06/12/1945, 70 y.o.   MRN: [id number]  HPI    Review of Systems  All other systems reviewed and are negative.      Objective:   Physical Exam        Assessment & Plan:

## 2015-10-10 ENCOUNTER — Ambulatory Visit: Payer: Medicare Other | Admitting: *Deleted

## 2015-10-10 DIAGNOSIS — M722 Plantar fascial fibromatosis: Secondary | ICD-10-CM

## 2015-10-10 NOTE — Progress Notes (Signed)
Patient ID: Renee Santos, female   DOB: 02/07/1946, 70 y.o.   MRN: [id number]  Patient presents for orthotic pick up.  Verbal and written break in and wear instructions given.  Patient will follow up in 4 weeks if symptoms worsen or fail to improve.

## 2015-10-10 NOTE — Patient Instructions (Signed)

## 2015-11-15 ENCOUNTER — Encounter: Payer: Self-pay | Admitting: Gynecology

## 2015-12-12 ENCOUNTER — Other Ambulatory Visit (INDEPENDENT_AMBULATORY_CARE_PROVIDER_SITE_OTHER): Payer: Medicare Other

## 2015-12-12 DIAGNOSIS — Z0000 Encounter for general adult medical examination without abnormal findings: Secondary | ICD-10-CM

## 2015-12-12 DIAGNOSIS — Z1159 Encounter for screening for other viral diseases: Secondary | ICD-10-CM

## 2015-12-12 LAB — CBC WITH DIFFERENTIAL/PLATELET
BASOS PCT: 0.7 % (ref 0.0–3.0)
Basophils Absolute: 0 10*3/uL (ref 0.0–0.1)
EOS PCT: 1.1 % (ref 0.0–5.0)
Eosinophils Absolute: 0 10*3/uL (ref 0.0–0.7)
HCT: 39.5 % (ref 36.0–46.0)
Hemoglobin: 13.7 g/dL (ref 12.0–15.0)
LYMPHS ABS: 1.6 10*3/uL (ref 0.7–4.0)
Lymphocytes Relative: 35.3 % (ref 12.0–46.0)
MCHC: 34.6 g/dL (ref 30.0–36.0)
MCV: 93.2 fl (ref 78.0–100.0)
MONO ABS: 0.4 10*3/uL (ref 0.1–1.0)
MONOS PCT: 8.6 % (ref 3.0–12.0)
NEUTROS ABS: 2.5 10*3/uL (ref 1.4–7.7)
NEUTROS PCT: 54.3 % (ref 43.0–77.0)
PLATELETS: 214 10*3/uL (ref 150.0–400.0)
RBC: 4.24 Mil/uL (ref 3.87–5.11)
RDW: 12.3 % (ref 11.5–15.5)
WBC: 4.5 10*3/uL (ref 4.0–10.5)

## 2015-12-12 LAB — LIPID PANEL
Cholesterol: 210 mg/dL — ABNORMAL HIGH (ref 0–200)
HDL: 98.5 mg/dL (ref >39.00)
LDL CALC: 93 mg/dL (ref 0–99)
NONHDL: 111.89
Total CHOL/HDL Ratio: 2
Triglycerides: 92 mg/dL (ref 0.0–149.0)
VLDL: 18.4 mg/dL (ref 0.0–40.0)

## 2015-12-12 LAB — COMPREHENSIVE METABOLIC PANEL
ALK PHOS: 46 U/L (ref 39–117)
ALT: 10 U/L (ref 0–35)
AST: 18 U/L (ref 0–37)
Albumin: 4 g/dL (ref 3.5–5.2)
BUN: 23 mg/dL (ref 6–23)
CHLORIDE: 105 meq/L (ref 96–112)
CO2: 31 meq/L (ref 19–32)
Calcium: 9.6 mg/dL (ref 8.4–10.5)
Creatinine, Ser: 1.1 mg/dL (ref 0.40–1.20)
GFR: 52.4 mL/min — AB (ref >60.00)
GLUCOSE: 102 mg/dL — AB (ref 70–99)
POTASSIUM: 4.8 meq/L (ref 3.5–5.1)
SODIUM: 142 meq/L (ref 135–145)
TOTAL PROTEIN: 6.9 g/dL (ref 6.0–8.3)
Total Bilirubin: 0.6 mg/dL (ref 0.2–1.2)

## 2015-12-12 LAB — TSH: TSH: 2.08 u[IU]/mL (ref 0.35–4.50)

## 2015-12-13 LAB — HEPATITIS C ANTIBODY: HCV Ab: NEGATIVE

## 2015-12-27 ENCOUNTER — Ambulatory Visit (INDEPENDENT_AMBULATORY_CARE_PROVIDER_SITE_OTHER): Payer: Medicare Other | Admitting: Internal Medicine

## 2015-12-27 ENCOUNTER — Encounter: Payer: Self-pay | Admitting: Internal Medicine

## 2015-12-27 VITALS — BP 134/86 | HR 72 | Temp 98.2°F | Resp 16 | Ht 62.0 in | Wt 102.0 lb

## 2015-12-27 DIAGNOSIS — E038 Other specified hypothyroidism: Secondary | ICD-10-CM | POA: Diagnosis not present

## 2015-12-27 DIAGNOSIS — E782 Mixed hyperlipidemia: Secondary | ICD-10-CM | POA: Diagnosis not present

## 2015-12-27 DIAGNOSIS — I10 Essential (primary) hypertension: Secondary | ICD-10-CM | POA: Diagnosis not present

## 2015-12-27 DIAGNOSIS — F418 Other specified anxiety disorders: Secondary | ICD-10-CM

## 2015-12-27 DIAGNOSIS — Z23 Encounter for immunization: Secondary | ICD-10-CM | POA: Diagnosis not present

## 2015-12-27 DIAGNOSIS — Z0000 Encounter for general adult medical examination without abnormal findings: Secondary | ICD-10-CM | POA: Diagnosis not present

## 2015-12-27 DIAGNOSIS — M8580 Other specified disorders of bone density and structure, unspecified site: Secondary | ICD-10-CM

## 2015-12-27 MED ORDER — LORAZEPAM 0.5 MG PO TABS: 0.5000 mg | ORAL_TABLET | Freq: Two times a day (BID) | ORAL | 0 refills | Status: DC | PRN

## 2015-12-27 NOTE — Assessment & Plan Note (Signed)
dexa up to date Taking calcium, vitamin d exercising

## 2015-12-27 NOTE — Assessment & Plan Note (Signed)
Check tsh  Titrate med dose if needed  

## 2015-12-27 NOTE — Assessment & Plan Note (Signed)
Check lipid panel  

## 2015-12-27 NOTE — Patient Instructions (Addendum)
Test reviewed today.  All other Health Maintenance issues reviewed.   All recommended immunizations and age-appropriate screenings are up-to-date or discussed.  flu immunization administered today.   Medications reviewed and updated.  No changes recommended at this time.  Your prescription(s) have been submitted to your pharmacy. Please take as directed and contact our office if you believe you are having problem(s) with the medication(s).   Please followup in one year   Health Maintenance, Female Introduction Adopting a healthy lifestyle and getting preventive care can go a long way to promote health and wellness. Talk with your health care provider about what schedule of regular examinations is right for you. This is a good chance for you to check in with your provider about disease prevention and staying healthy. In between checkups, there are plenty of things you can do on your own. Experts have done a lot of research about which lifestyle changes and preventive measures are most likely to keep you healthy. Ask your health care provider for more information. Weight and diet Eat a healthy diet  Be sure to include plenty of vegetables, fruits, low-fat dairy products, and lean protein.  Do not eat a lot of foods high in solid fats, added sugars, or salt.  Get regular exercise. This is one of the most important things you can do for your health.  Most adults should exercise for at least 150 minutes each week. The exercise should increase your heart rate and make you sweat (moderate-intensity exercise).  Most adults should also do strengthening exercises at least twice a week. This is in addition to the moderate-intensity exercise. Maintain a healthy weight  Body mass index (BMI) is a measurement that can be used to identify possible weight problems. It estimates body fat based on height and weight. Your health care provider can help determine your BMI and help you achieve or  maintain a healthy weight.  For females 25 years of age and older:  A BMI below 18.5 is considered underweight.  A BMI of 18.5 to 24.9 is normal.  A BMI of 25 to 29.9 is considered overweight.  A BMI of 30 and above is considered obese. Watch levels of cholesterol and blood lipids  You should start having your blood tested for lipids and cholesterol at 68 years of age, then have this test every 5 years.  You may need to have your cholesterol levels checked more often if:  Your lipid or cholesterol levels are high.  You are older than 68 years of age.  You are at high risk for heart disease. Cancer screening Lung Cancer  Lung cancer screening is recommended for adults 52-37 years old who are at high risk for lung cancer because of a history of smoking.  A yearly low-dose CT scan of the lungs is recommended for people who:  Currently smoke.  Have quit within the past 15 years.  Have at least a 30-pack-year history of smoking. A pack year is smoking an average of one pack of cigarettes a day for 1 year.  Yearly screening should continue until it has been 15 years since you quit.  Yearly screening should stop if you develop a health problem that would prevent you from having lung cancer treatment. Breast Cancer  Practice breast self-awareness. This means understanding how your breasts normally appear and feel.  It also means doing regular breast self-exams. Let your health care provider know about any changes, no matter how small.  If you are in your  70s or 88s, you should have a clinical breast exam (CBE) by a health care provider every 1-3 years as part of a regular health exam.  If you are 33 or older, have a CBE every year. Also consider having a breast X-ray (mammogram) every year.  If you have a family history of breast cancer, talk to your health care provider about genetic screening.  If you are at high risk for breast cancer, talk to your health care provider  about having an MRI and a mammogram every year.  Breast cancer gene (BRCA) assessment is recommended for women who have family members with BRCA-related cancers. BRCA-related cancers include:  Breast.  Ovarian.  Tubal.  Peritoneal cancers.  Results of the assessment will determine the need for genetic counseling and BRCA1 and BRCA2 testing. Cervical Cancer  Your health care provider may recommend that you be screened regularly for cancer of the pelvic organs (ovaries, uterus, and vagina). This screening involves a pelvic examination, including checking for microscopic changes to the surface of your cervix (Pap test). You may be encouraged to have this screening done every 3 years, beginning at age 79.  For women ages 52-65, health care providers may recommend pelvic exams and Pap testing every 3 years, or they may recommend the Pap and pelvic exam, combined with testing for human papilloma virus (HPV), every 5 years. Some types of HPV increase your risk of cervical cancer. Testing for HPV may also be done on women of any age with unclear Pap test results.  Other health care providers may not recommend any screening for nonpregnant women who are considered low risk for pelvic cancer and who do not have symptoms. Ask your health care provider if a screening pelvic exam is right for you.  If you have had past treatment for cervical cancer or a condition that could lead to cancer, you need Pap tests and screening for cancer for at least 20 years after your treatment. If Pap tests have been discontinued, your risk factors (such as having a new sexual partner) need to be reassessed to determine if screening should resume. Some women have medical problems that increase the chance of getting cervical cancer. In these cases, your health care provider may recommend more frequent screening and Pap tests. Colorectal Cancer  This type of cancer can be detected and often prevented.  Routine colorectal  cancer screening usually begins at 68 years of age and continues through 68 years of age.  Your health care provider may recommend screening at an earlier age if you have risk factors for colon cancer.  Your health care provider may also recommend using home test kits to check for hidden blood in the stool.  A small camera at the end of a tube can be used to examine your colon directly (sigmoidoscopy or colonoscopy). This is done to check for the earliest forms of colorectal cancer.  Routine screening usually begins at age 29.  Direct examination of the colon should be repeated every 5-10 years through 68 years of age. However, you may need to be screened more often if early forms of precancerous polyps or small growths are found. Skin Cancer  Check your skin from head to toe regularly.  Tell your health care provider about any new moles or changes in moles, especially if there is a change in a mole's shape or color.  Also tell your health care provider if you have a mole that is larger than the size of a pencil  eraser.  Always use sunscreen. Apply sunscreen liberally and repeatedly throughout the day.  Protect yourself by wearing long sleeves, pants, a wide-brimmed hat, and sunglasses whenever you are outside. Heart disease, diabetes, and high blood pressure  High blood pressure causes heart disease and increases the risk of stroke. High blood pressure is more likely to develop in:  People who have blood pressure in the high end of the normal range (130-139/85-89 mm Hg).  People who are overweight or obese.  People who are African American.  If you are 1-50 years of age, have your blood pressure checked every 3-5 years. If you are 70 years of age or older, have your blood pressure checked every year. You should have your blood pressure measured twice-once when you are at a hospital or clinic, and once when you are not at a hospital or clinic. Record the average of the two  measurements. To check your blood pressure when you are not at a hospital or clinic, you can use:  An automated blood pressure machine at a pharmacy.  A home blood pressure monitor.  If you are between 9 years and 21 years old, ask your health care provider if you should take aspirin to prevent strokes.  Have regular diabetes screenings. This involves taking a blood sample to check your fasting blood sugar level.  If you are at a normal weight and have a low risk for diabetes, have this test once every three years after 68 years of age.  If you are overweight and have a high risk for diabetes, consider being tested at a younger age or more often. Preventing infection Hepatitis B  If you have a higher risk for hepatitis B, you should be screened for this virus. You are considered at high risk for hepatitis B if:  You were born in a country where hepatitis B is common. Ask your health care provider which countries are considered high risk.  Your parents were born in a high-risk country, and you have not been immunized against hepatitis B (hepatitis B vaccine).  You have HIV or AIDS.  You use needles to inject street drugs.  You live with someone who has hepatitis B.  You have had sex with someone who has hepatitis B.  You get hemodialysis treatment.  You take certain medicines for conditions, including cancer, organ transplantation, and autoimmune conditions. Hepatitis C  Blood testing is recommended for:  Everyone born from [birth year] through 1963.  Anyone with known risk factors for hepatitis C. Sexually transmitted infections (STIs)  You should be screened for sexually transmitted infections (STIs) including gonorrhea and chlamydia if:  You are sexually active and are younger than 68 years of age.  You are older than 68 years of age and your health care provider tells you that you are at risk for this type of infection.  Your sexual activity has changed since you were last  screened and you are at an increased risk for chlamydia or gonorrhea. Ask your health care provider if you are at risk.  If you do not have HIV, but are at risk, it may be recommended that you take a prescription medicine daily to prevent HIV infection. This is called pre-exposure prophylaxis (PrEP). You are considered at risk if:  You are sexually active and do not regularly use condoms or know the HIV status of your partner(s).  You take drugs by injection.  You are sexually active with a partner who has HIV. Talk with your health  care provider about whether you are at high risk of being infected with HIV. If you choose to begin PrEP, you should first be tested for HIV. You should then be tested every 3 months for as long as you are taking PrEP. Pregnancy  If you are premenopausal and you may become pregnant, ask your health care provider about preconception counseling.  If you may become pregnant, take 400 to 800 micrograms (mcg) of folic acid every day.  If you want to prevent pregnancy, talk to your health care provider about birth control (contraception). Osteoporosis and menopause  Osteoporosis is a disease in which the bones lose minerals and strength with aging. This can result in serious bone fractures. Your risk for osteoporosis can be identified using a bone density scan.  If you are 72 years of age or older, or if you are at risk for osteoporosis and fractures, ask your health care provider if you should be screened.  Ask your health care provider whether you should take a calcium or vitamin D supplement to lower your risk for osteoporosis.  Menopause may have certain physical symptoms and risks.  Hormone replacement therapy may reduce some of these symptoms and risks. Talk to your health care provider about whether hormone replacement therapy is right for you. Follow these instructions at home:  Schedule regular health, dental, and eye exams.  Stay current with your  immunizations.  Do not use any tobacco products including cigarettes, chewing tobacco, or electronic cigarettes.  If you are pregnant, do not drink alcohol.  If you are breastfeeding, limit how much and how often you drink alcohol.  Limit alcohol intake to no more than 1 drink per day for nonpregnant women. One drink equals 12 ounces of beer, 5 ounces of wine, or 1 ounces of hard liquor.  Do not use street drugs.  Do not share needles.  Ask your health care provider for help if you need support or information about quitting drugs.  Tell your health care provider if you often feel depressed.  Tell your health care provider if you have ever been abused or do not feel safe at home. This information is not intended to replace advice given to you by your health care provider. Make sure you discuss any questions you have with your health care provider. Document Released: 08/13/2010 Document Revised: 07/06/2015 Document Reviewed: 11/01/2014  2017 Elsevier

## 2015-12-27 NOTE — Progress Notes (Signed)
Pre visit review using our clinic review tool, if applicable. No additional management support is needed unless otherwise documented below in the visit note. 

## 2015-12-27 NOTE — Assessment & Plan Note (Signed)
BP well controlled Current regimen effective and well tolerated Continue current medications at current doses cmp  

## 2015-12-27 NOTE — Assessment & Plan Note (Addendum)
Rarely takes ativan for anxiety Refilled today

## 2015-12-27 NOTE — Progress Notes (Signed)
Subjective:    Patient ID: Renee Santos, female    DOB: 1945/10/22, 70 y.o.   MRN: [id number]  HPI She is here for a physical exam.   She is walking regularly.  She has not been eating as healthy as she would like and plans on working on that.   She denies changes in her health and has no concerns.   Medications and allergies reviewed with patient and updated if appropriate.  Patient Active Problem List   Diagnosis Date Noted  . Calcific tendonitis of right hand 05/01/2015  . Herpes simplex 05/01/2015  . Situational anxiety 05/01/2015  . Essential hypertension 03/01/2008  . Osteopenia 03/01/2008  . THYROID NODULE 01/13/2007  . Hypothyroidism 01/13/2007  . HYPERLIPIDEMIA 01/13/2007  . PSORIASIS 01/13/2007    Current Outpatient Prescriptions on File Prior to Visit  Medication Sig Dispense Refill  . Calcium Carbonate-Vit D-Min (CALTRATE PLUS PO) Take by mouth daily.      . clobetasol cream (TEMOVATE) [id number] % Apply 1 application topically 2 (two) times daily. 45 g 1  . diphenhydrAMINE (BENADRYL) 25 MG tablet Take 25 mg by mouth at bedtime as needed. To help sleep    . estradiol (ESTRACE) 0.5 MG tablet Take 1 tablet (0.5 mg total) by mouth daily. (Patient taking differently: Take 0.5 mg by mouth. Takes 1 tablet 3 days a week.) 90 tablet 4  . hydrochlorothiazide (HYDRODIURIL) 25 MG tablet TAKE 1/2 BY MOUTH DAILY 45 tablet 3  . levothyroxine (SYNTHROID, LEVOTHROID) 50 MCG tablet Takes 1 tablet daily, except 1.5 tablets on tuesdays and thursdays 96 tablet 3  . metoprolol succinate (TOPROL-XL) 50 MG 24 hr tablet 1/2 by mouth daily 45 tablet 3   No current facility-administered medications on file prior to visit.     Past Medical History:  Diagnosis Date  . Cancer [facility]) 2011   Basal Cell of nose; Dr Martinique  . Herpes zoster 2008   posterior thorax  . Hypertension   . Neurofibroma of back 2013    Dr Amy Martinique   . Osteopenia 2016   T score -1.6 FRAX not calculated because of  HRT  . Pleurisy without effusion 01/21/2012   resolved  with NSAIDS  . Psoriasis    Dr Amy Martinique  . Thyroid nodule     Past Surgical History:  Procedure Laterality Date  . ABDOMINAL HYSTERECTOMY      LSO  & appendectomy for endometrioma   @ age38 for ovarian cyst  . APPENDECTOMY    . cataract Bilateral Dec 2016;   not sure of date; wears readers only   . COLONOSCOPY  multiple  . OOPHORECTOMY     LSO for cyst  . TONSILLECTOMY      Social History   Social History  . Marital status: Married    Spouse name: N/A  . Number of children: N/A  . Years of education: N/A   Social History Main Topics  . Smoking status: Former Research scientist (life sciences)  . Smokeless tobacco: Never Used     Comment: college onlysocially  . Alcohol use 1.8 oz/week    3 Glasses of wine per week     Comment: average of 3 glasses wine week/ not a daily drinker, more on weekends  . Drug use: No  . Sexual activity: Yes    Birth control/ protection: Surgical     Comment: HYST-1st intercourse 70 yo-Fewer than 5 partners   Other Topics Concern  . None   Social History Narrative  Lives with husband x 45 years in a two story home.  Has 2 children.     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     Education: college.    Family History  Problem Relation Age of Onset  . Hypertension Father   . Lung cancer Father     remote smoker, deceased 40  . Hyperlipidemia Mother     CABG in late 36s  . Hypothyroidism Mother   . Macular degeneration Mother   . Hypertension Mother   . Coronary artery disease Mother     bypass surgery  . Stroke Mother 52    Deceased  . Coronary artery disease Maternal Grandmother   . Diabetes Neg Hx   . Heart attack Neg Hx   . Colon cancer Neg Hx   . Asthma Maternal Uncle     2  . Asthma Maternal Grandfather     Review of Systems  Constitutional: Negative for appetite change, chills, fatigue, fever and unexpected weight change.  HENT: Negative for hearing loss and tinnitus.   Eyes: Negative for  visual disturbance.  Respiratory: Negative for cough, shortness of breath and wheezing.   Cardiovascular: Negative for chest pain, palpitations and leg swelling.  Gastrointestinal: Positive for constipation. Negative for abdominal pain, blood in stool, diarrhea and nausea.       No gerd  Genitourinary: Negative for dysuria and hematuria.  Musculoskeletal: Negative for arthralgias and myalgias.  Skin: Negative for color change and rash.  Neurological: Negative for dizziness, light-headedness and headaches.  Psychiatric/Behavioral: Negative for dysphoric mood. The patient is not nervous/anxious.        Objective:   Vitals:   12/27/15 0901  BP: 134/86  Pulse: 72  Resp: 16  Temp: 98.2 F (36.8 C)   Filed Weights   12/27/15 0901  Weight: 102 lb (46.3 kg)   Body mass index is 18.66 kg/m.   Physical Exam Constitutional: She appears well-developed and well-nourished. No distress.  HENT:  Head: Normocephalic and atraumatic.  Right Ear: External ear normal. Normal ear canal and TM Left Ear: External ear normal.  Normal ear canal and TM Mouth/Throat: Oropharynx is clear and moist.  Eyes: Conjunctivae and EOM are normal.  Neck: Neck supple. No tracheal deviation present. No thyromegaly present.  No carotid bruit  Cardiovascular: Normal rate, regular rhythm and normal heart sounds.   No murmur heard.  No edema. Pulmonary/Chest: Effort normal and breath sounds normal. No respiratory distress. She has no wheezes. She has no rales.  Breast: deferred to Gyn Abdominal: Soft. She exhibits no distension. There is no tenderness.  Lymphadenopathy: She has no cervical adenopathy.  Skin: Skin is warm and dry. She is not diaphoretic.  Psychiatric: She has a normal mood and affect. Her behavior is normal.         Assessment & Plan:   Physical exam: Screening blood work  reviewed Immunizations  Flu vaccine today, others up to date Colonoscopy  Up to date  Mammogram  Up to date  Gyn   Up to date  Dexa  Up to date  Eye exams  Up to date  Exercise - walks regularly Weight - normal BMI Skin -no concerns, sees derm annually Substance   none  See Problem List for Assessment and Plan of chronic medical problems.

## 2015-12-28 ENCOUNTER — Encounter: Payer: Self-pay | Admitting: Internal Medicine

## 2016-01-12 ENCOUNTER — Encounter: Payer: Self-pay | Admitting: Internal Medicine

## 2016-01-12 MED ORDER — LORAZEPAM 0.5 MG PO TABS: 0.5000 mg | ORAL_TABLET | Freq: Two times a day (BID) | ORAL | 0 refills | Status: DC | PRN

## 2016-01-12 NOTE — Telephone Encounter (Signed)
RX faxed to [facility] 

## 2016-01-12 NOTE — Telephone Encounter (Signed)
 controlled substance database checked.  Ok to fill medication.  rx printed.  Please fax to [place]

## 2016-01-17 ENCOUNTER — Encounter: Payer: Self-pay | Admitting: Gynecology

## 2016-01-17 ENCOUNTER — Ambulatory Visit (INDEPENDENT_AMBULATORY_CARE_PROVIDER_SITE_OTHER): Payer: Medicare Other | Admitting: Gynecology

## 2016-01-17 VITALS — BP 116/76 | Ht 61.5 in | Wt 102.0 lb

## 2016-01-17 DIAGNOSIS — Z01411 Encounter for gynecological examination (general) (routine) with abnormal findings: Secondary | ICD-10-CM | POA: Diagnosis not present

## 2016-01-17 DIAGNOSIS — N952 Postmenopausal atrophic vaginitis: Secondary | ICD-10-CM

## 2016-01-17 DIAGNOSIS — Z79890 Hormone replacement therapy (postmenopausal): Secondary | ICD-10-CM

## 2016-01-17 DIAGNOSIS — M8580 Other specified disorders of bone density and structure, unspecified site: Secondary | ICD-10-CM

## 2016-01-17 NOTE — Progress Notes (Signed)
    Renee Santos Sep 27, 1945 [id number]        70 y.o.  G2P2  for breast and pelvic exam  Past medical history,surgical history, problem list, medications, allergies, family history and social history were all reviewed and documented as reviewed in the EPIC chart.  ROS:  Performed with pertinent positives and negatives included in the history, assessment and plan.   Additional significant findings :  None   Exam: Caryn Bee assistant Vitals:   01/17/16 0817  BP: 116/76  Weight: 102 lb (46.3 kg)  Height: 5' 1.5" (1.562 m)   Body mass index is 18.96 kg/m.  General appearance:  Normal affect, orientation and appearance. Skin: Grossly normal HEENT: Without gross lesions.  No cervical or supraclavicular adenopathy. Thyroid normal.  Lungs:  Clear without wheezing, rales or rhonchi Cardiac: RR, without RMG Abdominal:  Soft, nontender, without masses, guarding, rebound, organomegaly or hernia Breasts:  Examined lying and sitting without masses, retractions, discharge or axillary adenopathy. Pelvic:  Ext, BUS, Vagina with atrophic changes  Adnexa without masses or tenderness    Anus and perineum normal   Rectovaginal normal sphincter tone without palpated masses or tenderness.    Assessment/Plan:  70 y.o. G2P2 female for breast and pelvic exam.   1. Postmenopausal/atrophic genital changes. Status post TVH LSO for endometriosis. On estradiol 0.5 mg daily. Tried weaning but had unacceptable hot flushes and sweats. I reviewed the latest 2017 NAMS guidelines to include benefits of symptom relief and possible cardiovascular/bone health when started early. Risks to include thrombosis of stroke heart attack DVT and the breast cancer issue discussed. Patient wants to continue. She is switching insurance in January and will call us with her new information to refill her 1 year. 2. Mammography scheduled next week. SBE monthly reviewed. 3. Osteopenia. DEXA 12/2014 T score -1.6 stable from prior  exam. Plan repeat DEXA at 2 year interval. 4. Colonoscopy 2015. Repeat at their recommended interval. 5. Pap smear 2015. No Pap smear done today. No history of significant abnormal Pap smears. Reviewed current screening guidelines and options to stop screening altogether based on age and hysterectomy reviewed. Will readdress on annual basis. 6. Health maintenance. No routine lab work done as patient does this elsewhere. Follow up 1 year, sooner as needed.   Anastasio Auerbach MD, 8:40 AM 01/17/2016

## 2016-01-17 NOTE — Patient Instructions (Signed)

## 2016-01-22 ENCOUNTER — Encounter: Payer: Self-pay | Admitting: Internal Medicine

## 2016-01-23 NOTE — Telephone Encounter (Signed)
Can you check on this and let her know.

## 2016-01-30 LAB — HM MAMMOGRAPHY

## 2016-02-01 ENCOUNTER — Encounter: Payer: Self-pay | Admitting: Internal Medicine

## 2016-02-20 ENCOUNTER — Other Ambulatory Visit: Payer: Self-pay | Admitting: Internal Medicine

## 2016-02-21 DIAGNOSIS — H52221 Regular astigmatism, right eye: Secondary | ICD-10-CM | POA: Diagnosis not present

## 2016-02-21 DIAGNOSIS — H524 Presbyopia: Secondary | ICD-10-CM | POA: Diagnosis not present

## 2016-02-21 DIAGNOSIS — H5213 Myopia, bilateral: Secondary | ICD-10-CM | POA: Diagnosis not present

## 2016-02-21 MED ORDER — LORAZEPAM 0.5 MG PO TABS: 0.5000 mg | ORAL_TABLET | Freq: Two times a day (BID) | ORAL | 2 refills | Status: DC | PRN

## 2016-02-21 NOTE — Telephone Encounter (Signed)
Faxed script to [facility]....Johny Chess

## 2016-02-21 NOTE — Telephone Encounter (Signed)
[place] controlled substance database checked.  Ok to fill medication.    Refills added to script

## 2016-03-12 ENCOUNTER — Other Ambulatory Visit: Payer: Self-pay

## 2016-03-12 ENCOUNTER — Encounter: Payer: Self-pay | Admitting: Gynecology

## 2016-03-12 MED ORDER — ESTRADIOL 0.5 MG PO TABS: 0.5000 mg | ORAL_TABLET | Freq: Every day | ORAL | 3 refills | Status: DC

## 2016-03-25 ENCOUNTER — Telehealth: Payer: Self-pay | Admitting: *Deleted

## 2016-03-25 NOTE — Telephone Encounter (Signed)
PA done online for estradiol 0.5 mg medication has been approved from 03/24/16 until 02/10/17.

## 2016-04-22 DIAGNOSIS — I788 Other diseases of capillaries: Secondary | ICD-10-CM | POA: Diagnosis not present

## 2016-04-22 DIAGNOSIS — L821 Other seborrheic keratosis: Secondary | ICD-10-CM | POA: Diagnosis not present

## 2016-04-22 DIAGNOSIS — D2262 Melanocytic nevi of left upper limb, including shoulder: Secondary | ICD-10-CM | POA: Diagnosis not present

## 2016-04-22 DIAGNOSIS — D2211 Melanocytic nevi of right eyelid, including canthus: Secondary | ICD-10-CM | POA: Diagnosis not present

## 2016-04-22 DIAGNOSIS — D2271 Melanocytic nevi of right lower limb, including hip: Secondary | ICD-10-CM | POA: Diagnosis not present

## 2016-04-22 DIAGNOSIS — D2261 Melanocytic nevi of right upper limb, including shoulder: Secondary | ICD-10-CM | POA: Diagnosis not present

## 2016-04-22 DIAGNOSIS — D225 Melanocytic nevi of trunk: Secondary | ICD-10-CM | POA: Diagnosis not present

## 2016-04-22 DIAGNOSIS — Z419 Encounter for procedure for purposes other than remedying health state, unspecified: Secondary | ICD-10-CM | POA: Diagnosis not present

## 2016-04-22 DIAGNOSIS — L814 Other melanin hyperpigmentation: Secondary | ICD-10-CM | POA: Diagnosis not present

## 2016-04-22 DIAGNOSIS — D2272 Melanocytic nevi of left lower limb, including hip: Secondary | ICD-10-CM | POA: Diagnosis not present

## 2016-04-22 DIAGNOSIS — L400 Psoriasis vulgaris: Secondary | ICD-10-CM | POA: Diagnosis not present

## 2016-05-18 ENCOUNTER — Encounter: Payer: Self-pay | Admitting: Internal Medicine

## 2016-05-18 DIAGNOSIS — I10 Essential (primary) hypertension: Secondary | ICD-10-CM

## 2016-05-18 DIAGNOSIS — E038 Other specified hypothyroidism: Secondary | ICD-10-CM

## 2016-05-20 ENCOUNTER — Encounter: Payer: Self-pay | Admitting: Internal Medicine

## 2016-05-20 MED ORDER — HYDROCHLOROTHIAZIDE 25 MG PO TABS: ORAL_TABLET | ORAL | 2 refills | Status: DC

## 2016-05-20 MED ORDER — METOPROLOL SUCCINATE ER 50 MG PO TB24: ORAL_TABLET | ORAL | 2 refills | Status: DC

## 2016-05-20 MED ORDER — LEVOTHYROXINE SODIUM 50 MCG PO TABS: ORAL_TABLET | ORAL | 2 refills | Status: DC

## 2016-07-16 ENCOUNTER — Other Ambulatory Visit (INDEPENDENT_AMBULATORY_CARE_PROVIDER_SITE_OTHER): Payer: PPO

## 2016-07-16 ENCOUNTER — Encounter: Payer: Self-pay | Admitting: Internal Medicine

## 2016-07-16 ENCOUNTER — Ambulatory Visit (INDEPENDENT_AMBULATORY_CARE_PROVIDER_SITE_OTHER): Payer: PPO | Admitting: Internal Medicine

## 2016-07-16 VITALS — BP 130/74 | HR 66 | Temp 98.7°F | Resp 16 | Wt 102.0 lb

## 2016-07-16 DIAGNOSIS — R739 Hyperglycemia, unspecified: Secondary | ICD-10-CM

## 2016-07-16 DIAGNOSIS — R42 Dizziness and giddiness: Secondary | ICD-10-CM | POA: Insufficient documentation

## 2016-07-16 DIAGNOSIS — R7303 Prediabetes: Secondary | ICD-10-CM | POA: Insufficient documentation

## 2016-07-16 DIAGNOSIS — E038 Other specified hypothyroidism: Secondary | ICD-10-CM

## 2016-07-16 DIAGNOSIS — I10 Essential (primary) hypertension: Secondary | ICD-10-CM | POA: Diagnosis not present

## 2016-07-16 LAB — CBC WITH DIFFERENTIAL/PLATELET
BASOS ABS: 0 10*3/uL (ref 0.0–0.1)
BASOS PCT: 0.7 % (ref 0.0–3.0)
EOS ABS: 0 10*3/uL (ref 0.0–0.7)
Eosinophils Relative: 1.1 % (ref 0.0–5.0)
HCT: 38.2 % (ref 36.0–46.0)
Hemoglobin: 12.9 g/dL (ref 12.0–15.0)
LYMPHS ABS: 1.7 10*3/uL (ref 0.7–4.0)
Lymphocytes Relative: 36.4 % (ref 12.0–46.0)
MCHC: 33.7 g/dL (ref 30.0–36.0)
MCV: 95 fl (ref 78.0–100.0)
MONO ABS: 0.4 10*3/uL (ref 0.1–1.0)
Monocytes Relative: 9.5 % (ref 3.0–12.0)
NEUTROS ABS: 2.5 10*3/uL (ref 1.4–7.7)
NEUTROS PCT: 52.3 % (ref 43.0–77.0)
PLATELETS: 216 10*3/uL (ref 150.0–400.0)
RBC: 4.02 Mil/uL (ref 3.87–5.11)
RDW: 12.1 % (ref 11.5–15.5)
WBC: 4.7 10*3/uL (ref 4.0–10.5)

## 2016-07-16 LAB — COMPREHENSIVE METABOLIC PANEL
ALT: 14 U/L (ref 0–35)
AST: 21 U/L (ref 0–37)
Albumin: 3.9 g/dL (ref 3.5–5.2)
Alkaline Phosphatase: 42 U/L (ref 39–117)
BUN: 17 mg/dL (ref 6–23)
CALCIUM: 9.4 mg/dL (ref 8.4–10.5)
CHLORIDE: 102 meq/L (ref 96–112)
CO2: 32 meq/L (ref 19–32)
Creatinine, Ser: 0.96 mg/dL (ref 0.40–1.20)
GFR: 61.21 mL/min (ref >60.00)
GLUCOSE: 100 mg/dL — AB (ref 70–99)
Potassium: 3.4 mEq/L — ABNORMAL LOW (ref 3.5–5.1)
SODIUM: 140 meq/L (ref 135–145)
Total Bilirubin: 0.5 mg/dL (ref 0.2–1.2)
Total Protein: 6.8 g/dL (ref 6.0–8.3)

## 2016-07-16 LAB — TSH: TSH: 2.72 u[IU]/mL (ref 0.35–4.50)

## 2016-07-16 LAB — HEMOGLOBIN A1C: HEMOGLOBIN A1C: 5.5 % (ref 4.6–6.5)

## 2016-07-16 NOTE — Patient Instructions (Signed)
  Test(s) ordered today. Your results will be released to [place] (or called to you) after review, usually within 72hours after test completion. If any changes need to be made, you will be notified at that same time.   Medications reviewed and updated.  No changes recommended at this time.  A CT scan of your head was ordered - someone will call you to schedule this.

## 2016-07-16 NOTE — Assessment & Plan Note (Signed)
As above.

## 2016-07-16 NOTE — Assessment & Plan Note (Signed)
First episode sounds like vertigo, more recent symptoms may also be lightheadedness/dizziness.  She is on estradiol and we need to rule out a stroke.  She does not think she can tolerate an MRI, will obtain a CT Check labs No focal neurological deficit - beside estradiol low risk for cva

## 2016-07-16 NOTE — Progress Notes (Signed)
Subjective:    Patient ID: Renee Santos, female    DOB: [birth year], 71 y.o.   MRN: [id number]  HPI She is here for an acute visit.   Dizziness:  It started about one month ago.  She walked as usual - she walks daily.  She was volunteering at a [employer].  She was standing and had sudden onset spinning feeling. She sat down and the spinning lasted 15 minutes.  She felt a little "funky" after that, but it resolved. In the past week she has had a weird feeling - her head feels heavy.  She feels off balance.  She denies headache, nausea.  She feels a little spacey. She just does not feel right.  She does feel "sharp".     Her BP this morning was 113/69 and later 135/65.  She walks regularly for exercise.    She denies numbness/tingling and weakness in her arms and legs.  She does have a little tingling around her mouth.   Medications and allergies reviewed with patient and updated if appropriate.  Patient Active Problem List   Diagnosis Date Noted  . Calcific tendonitis of right hand 05/01/2015  . Herpes simplex 05/01/2015  . Situational anxiety 05/01/2015  . Essential hypertension 03/01/2008  . Osteopenia 03/01/2008  . THYROID NODULE 01/13/2007  . Hypothyroidism 01/13/2007  . HYPERLIPIDEMIA 01/13/2007  . PSORIASIS 01/13/2007    Current Outpatient Prescriptions on File Prior to Visit  Medication Sig Dispense Refill  . Calcium Carbonate-Vit D-Min (CALTRATE PLUS PO) Take by mouth daily.      . clobetasol cream (TEMOVATE) 7.11 % Apply 1 application topically 2 (two) times daily. 45 g 1  . diphenhydrAMINE (BENADRYL) 25 MG tablet Take 25 mg by mouth at bedtime as needed. To help sleep    . estradiol (ESTRACE) 0.5 MG tablet Take 1 tablet (0.5 mg total) by mouth daily. 90 tablet 3  . hydrochlorothiazide (HYDRODIURIL) 25 MG tablet TAKE 1/2 BY MOUTH DAILY 45 tablet 2  . levothyroxine (SYNTHROID, LEVOTHROID) 50 MCG tablet Takes 1 tablet daily, except 1.5 tablets on tuesdays and thursdays 96  tablet 2  . LORazepam (ATIVAN) 0.5 MG tablet Take 1 tablet (0.5 mg total) by mouth 2 (two) times daily as needed for anxiety. 15 tablet 2  . metoprolol succinate (TOPROL-XL) 50 MG 24 hr tablet 1/2 by mouth daily 45 tablet 2   No current facility-administered medications on file prior to visit.     Past Medical History:  Diagnosis Date  . Cancer [facility]) 2011   Basal Cell of nose; Dr Martinique  . Herpes zoster 2008   posterior thorax  . Hypertension   . Neurofibroma of back 2013    Dr Amy Martinique   . Osteopenia 2016   T score -1.6 FRAX not calculated because of HRT  . Psoriasis    Dr Amy Martinique  . Thyroid nodule     Past Surgical History:  Procedure Laterality Date  . ABDOMINAL HYSTERECTOMY      LSO  & appendectomy for endometrioma   @ age38 for ovarian cyst  . APPENDECTOMY    . cataract Bilateral Dec 2016;   not sure of date; wears readers only   . COLONOSCOPY  multiple  . OOPHORECTOMY     LSO for cyst  . TONSILLECTOMY      Social History   Social History  . Marital status: Married    Spouse name: N/A  . Number of children: N/A  .  Years of education: N/A   Social History Main Topics  . Smoking status: Former Research scientist (life sciences)  . Smokeless tobacco: Never Used     Comment: college onlysocially  . Alcohol use 3.0 oz/week    5 Glasses of wine per week  . Drug use: No  . Sexual activity: Yes    Birth control/ protection: Surgical     Comment: HYST-1st intercourse 71 yo-Fewer than 5 partners   Other Topics Concern  . Not on file   Social History Narrative   Lives with husband x 45 years in a two story home.  Has 2 children.     Retired Education officer, museum in [phone].     Education: college.    Family History  Problem Relation Age of Onset  . Hypertension Father   . Lung cancer Father        remote smoker, deceased 53  . Hyperlipidemia Mother        CABG in late 19s  . Hypothyroidism Mother   . Macular degeneration Mother   . Hypertension Mother   . Coronary artery disease  Mother        bypass surgery  . Stroke Mother 75       Deceased  . Coronary artery disease Maternal Grandmother   . Asthma Maternal Uncle        2  . Asthma Maternal Grandfather   . Diabetes Neg Hx   . Heart attack Neg Hx   . Colon cancer Neg Hx     Review of Systems  Constitutional: Negative for chills and fever.  HENT: Negative for congestion, ear pain, sinus pain, sinus pressure and sore throat.   Eyes: Negative for visual disturbance.  Respiratory: Negative for cough, shortness of breath and wheezing.   Cardiovascular: Negative for chest pain, palpitations and leg swelling.  Gastrointestinal: Negative for nausea.  Musculoskeletal: Positive for gait problem (feels less balanced recently - intermittent).  Neurological: Positive for dizziness (one episode), light-headedness (? feeling that now) and numbness (occ lips feel tingling, little in fingertips). Negative for weakness and headaches (heavy feeling around head).       Objective:   Vitals:   07/16/16 1457  BP: 130/74  Pulse: 66  Resp: 16  Temp: 98.7 F (37.1 C)   Filed Weights   07/16/16 1457  Weight: 102 lb (46.3 kg)   Body mass index is 18.96 kg/m.  Wt Readings from Last 3 Encounters:  07/16/16 102 lb (46.3 kg)  01/17/16 102 lb (46.3 kg)  12/27/15 102 lb (46.3 kg)     Physical Exam GENERAL APPEARANCE: Appears stated age, well appearing, NAD EYES: conjunctiva clear, no icterus, no nystagmus HEENT: bilateral tympanic membranes and ear canals normal, oropharynx with no erythema, no thyromegaly, trachea midline, no cervical or supraclavicular lymphadenopathy LUNGS: Clear to auscultation without wheeze or crackles, unlabored breathing, good air entry bilaterally HEART: Normal S1,S2 without murmurs Neuro: gait normal, CN II-XII intact, normal strength and sensation in all extremities EXTREMITIES: Without clubbing, cyanosis, or edema        Assessment & Plan:    See Problem List for Assessment and Plan  of chronic medical problems.

## 2016-07-16 NOTE — Assessment & Plan Note (Signed)
BP well controlled Current regimen effective and well tolerated Continue current medications at current doses  

## 2016-07-16 NOTE — Assessment & Plan Note (Signed)
Check a1c 

## 2016-07-16 NOTE — Assessment & Plan Note (Signed)
Check tsh - unlikely the cause of her current lightheadedness/dizziness

## 2016-07-18 ENCOUNTER — Encounter: Payer: Self-pay | Admitting: Internal Medicine

## 2016-07-22 ENCOUNTER — Encounter: Payer: Self-pay | Admitting: Internal Medicine

## 2016-07-25 ENCOUNTER — Ambulatory Visit (INDEPENDENT_AMBULATORY_CARE_PROVIDER_SITE_OTHER)
Admission: RE | Admit: 2016-07-25 | Discharge: 2016-07-25 | Disposition: A | Discharge status: Home / Self Care | Payer: PPO | Source: Ambulatory Visit | Attending: Internal Medicine | Admitting: Internal Medicine

## 2016-07-25 DIAGNOSIS — R42 Dizziness and giddiness: Secondary | ICD-10-CM | POA: Diagnosis not present

## 2016-08-19 IMAGING — US US SOFT TISSUE HEAD/NECK
1 series · 13 of 25 positions shown · non-contrast
Comparison: 08/09/2010; 05/18/2007

CLINICAL DATA: Follow-up of multi nodular goiter. Patient currently
on Synthroid. Initial encounter.

EXAM:
THYROID ULTRASOUND
TECHNIQUE: Ultrasound examination of the thyroid gland and adjacent soft
tissues was performed.

[Series 1: us soft tissue head/neck · 0.06mm/px · 13 of 44 slices shown]
[im 1/44]
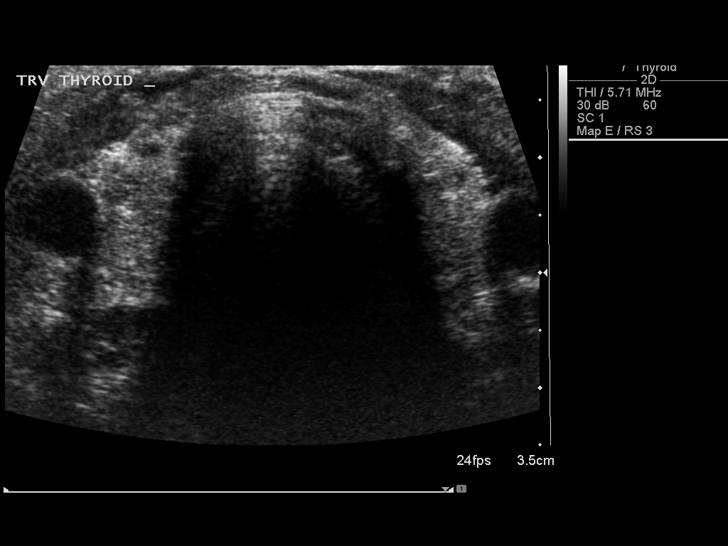
[im 4/44]
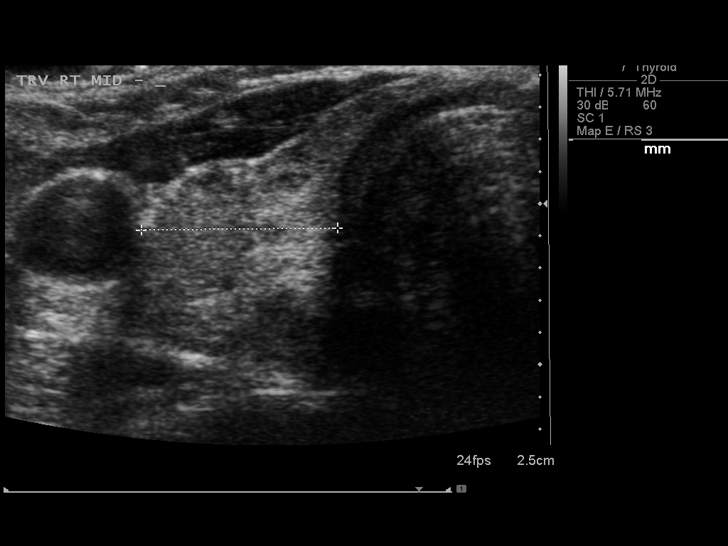
[im 8/44]
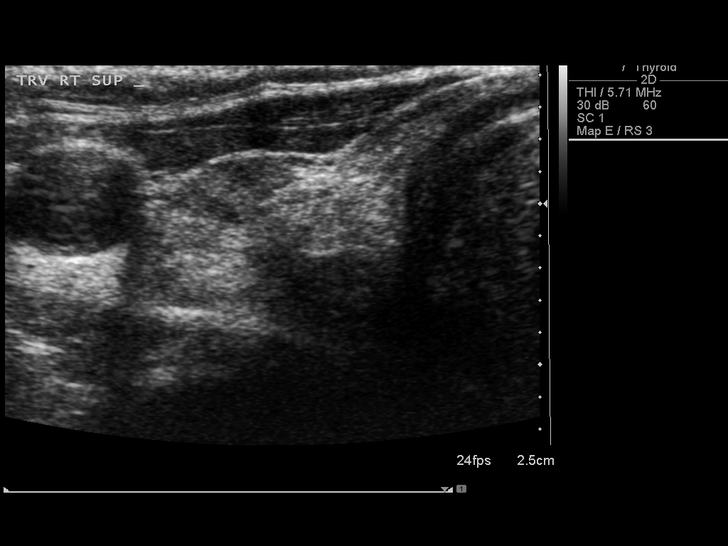
[im 11/44]
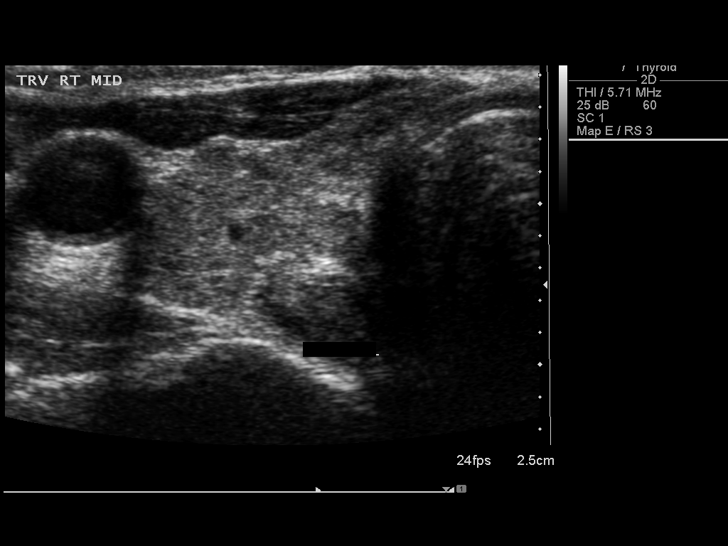
[im 15/44]
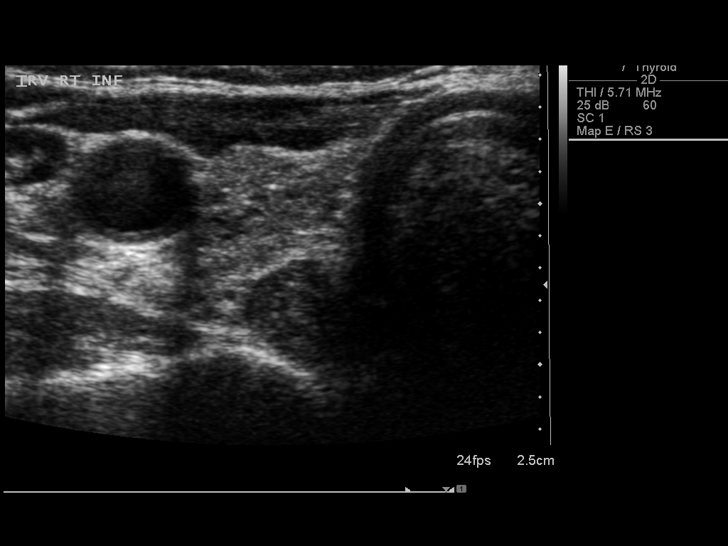
[im 18/44]
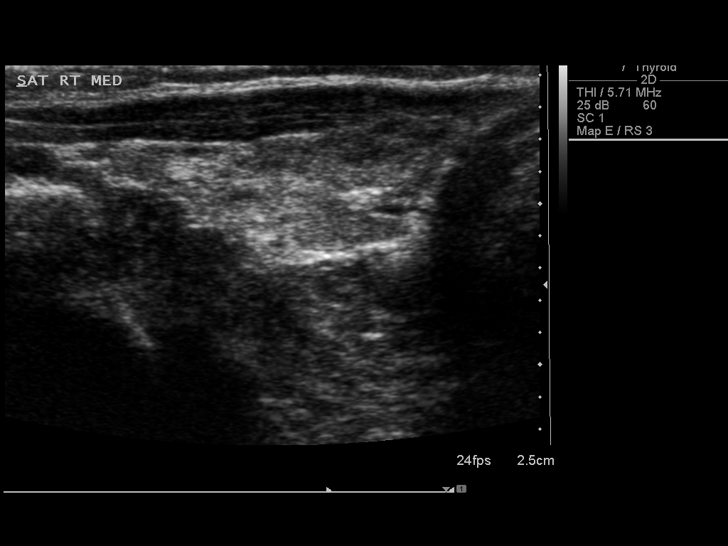
[im 22/44]
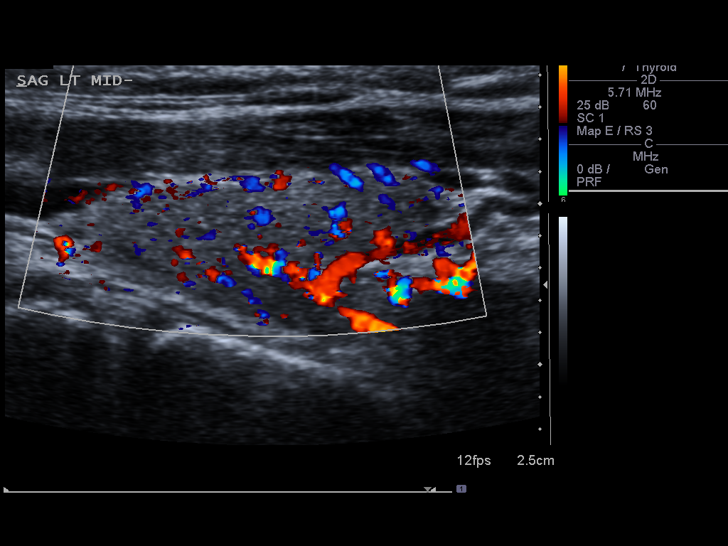
[im 26/44]
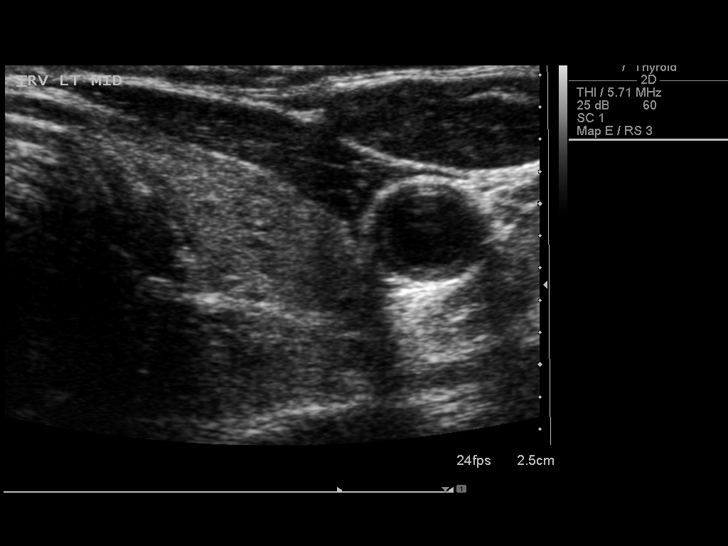
[im 29/44]
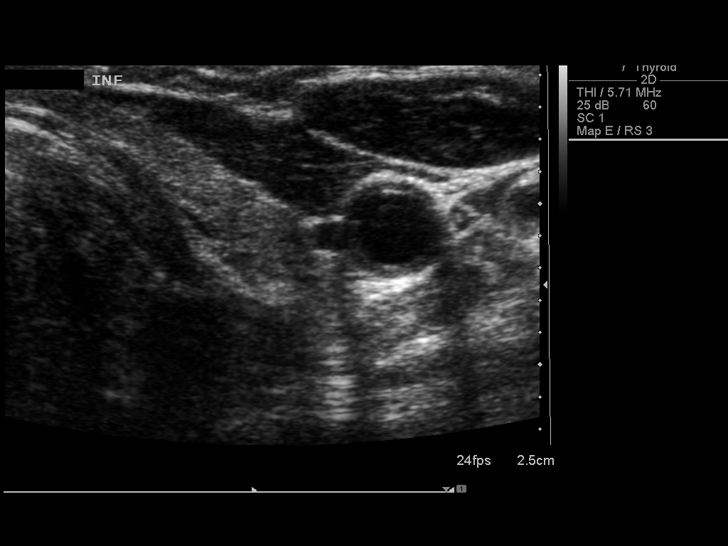
[im 33/44]
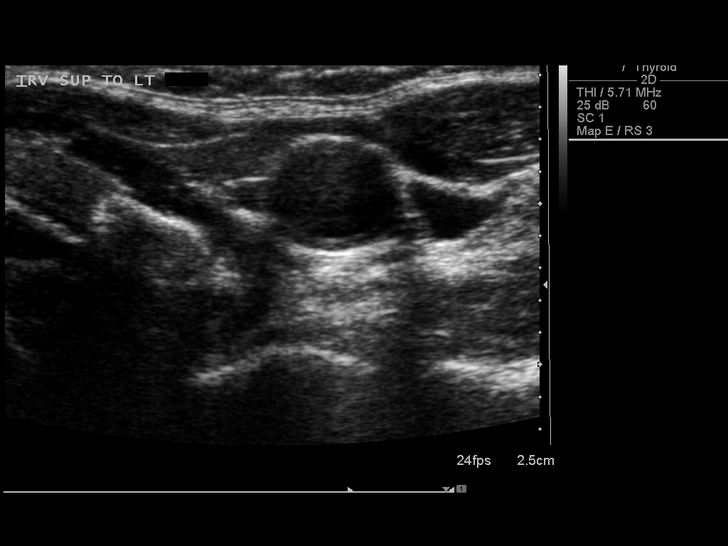
[im 36/44]
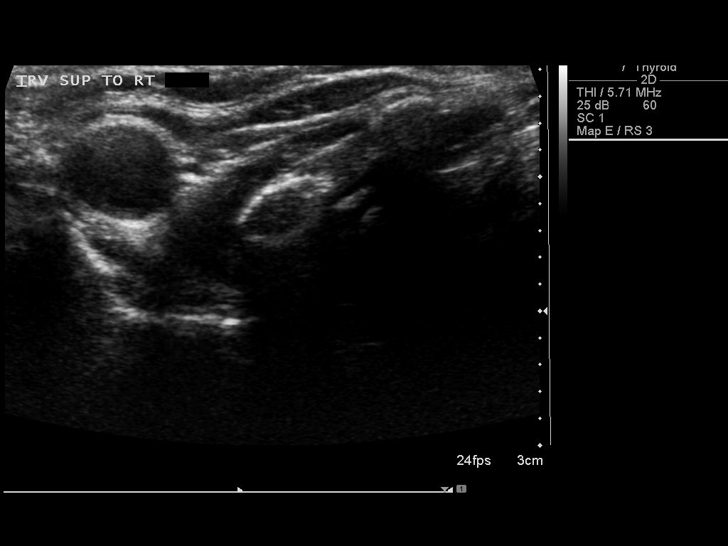
[im 40/44]
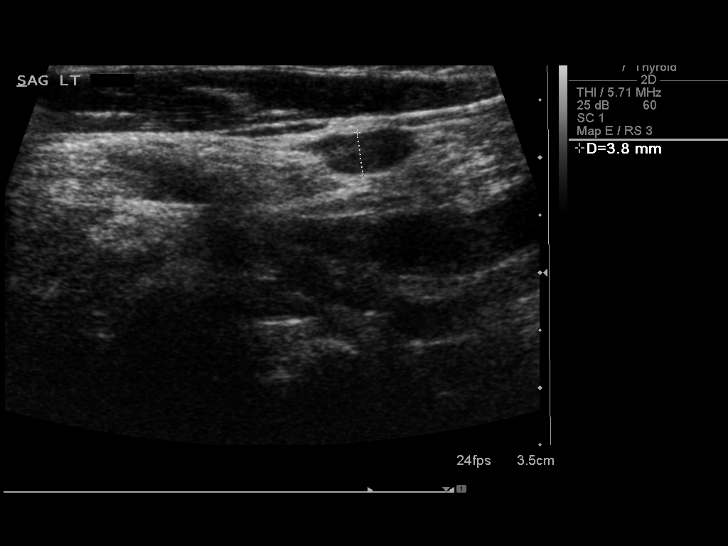
[im 44/44]
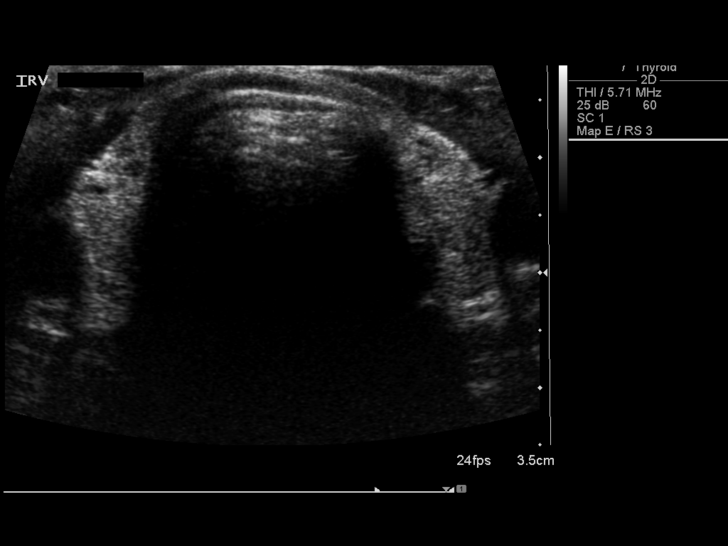

[13 of 25 positions shown; findings below may reference images not displayed]

FINDINGS: There is diffuse heterogeneity of thyroid parenchymal echotexture.
No enlarging or new worrisome thyroid nodules.

Right thyroid lobe

Measurements: Slightly diminutive in size measuring 3.2 x 0.9 x
cm, grossly unchanged measuring 3.8 x 1.3 x 1.1 cm.

Right, inferior - 0.4 cm - echogenic, shadowing dystrophic
calcification, grossly unchanged since the 9889 examination.

Left thyroid lobe

Measurements: Slightly diminutive in size measuring 2.5 x 0.8 x
cm, grossly unchanged measuring 3.3 x 1.7 x 0.9 cm

Left, superior - 0.2 cm - anechoic, likely cystic, unchanged since
the 9889 examination.

Left, mid - 0.2 cm - anechoic with internal echogenic nodule with
ring down artifact suggestive of colloid not definitively seen on
the prior examination.

Isthmus

Thickness: Normal in size measuring 0.2 cm in diameter, unchanged.

Lymphadenopathy

None visualized.
IMPRESSION: 1. No enlarging or new worrisome thyroid nodules.
2. Dominant thyroid nodules are unchanged since the 9889
examination.

## 2016-10-07 NOTE — Progress Notes (Signed)
Subjective:    Patient ID: Renee Santos, female    DOB: 1945-02-28, 71 y.o.   MRN: [id number]  HPI She is here for an acute visit.   Left big toe infected: her symptoms started two weeks ago.  The medial aspect of the end of nail is very tender.  She can barely touch that area.  It can be red and swollen at times.  She denies discharge.  She has soaked it and applied neosporin.  She is getting ready to go traveling and hiking in [place] and does not want this to be an issue.    She struggles with ingrown toe nails and has been able to manage it by herself.  She did have a fungal infection on her toenails, but it has gotten better.    She denies fever or chills, numbness/tingling in her toes.     Medications and allergies reviewed with patient and updated if appropriate.  Patient Active Problem List   Diagnosis Date Noted  . Hyperglycemia 07/16/2016  . Dizziness 07/16/2016  . Lightheadedness 07/16/2016  . Calcific tendonitis of right hand 05/01/2015  . Herpes simplex 05/01/2015  . Situational anxiety 05/01/2015  . Essential hypertension 03/01/2008  . Osteopenia 03/01/2008  . THYROID NODULE 01/13/2007  . Hypothyroidism 01/13/2007  . HYPERLIPIDEMIA 01/13/2007  . PSORIASIS 01/13/2007    Current Outpatient Prescriptions on File Prior to Visit  Medication Sig Dispense Refill  . Calcium Carbonate-Vit D-Min (CALTRATE PLUS PO) Take by mouth daily.      . clobetasol cream (TEMOVATE) 0.35 % Apply 1 application topically 2 (two) times daily. 45 g 1  . diphenhydrAMINE (BENADRYL) 25 MG tablet Take 25 mg by mouth at bedtime as needed. To help sleep    . estradiol (ESTRACE) 0.5 MG tablet Take 1 tablet (0.5 mg total) by mouth daily. 90 tablet 3  . hydrochlorothiazide (HYDRODIURIL) 25 MG tablet TAKE 1/2 BY MOUTH DAILY 45 tablet 2  . levothyroxine (SYNTHROID, LEVOTHROID) 50 MCG tablet Takes 1 tablet daily, except 1.5 tablets on tuesdays and thursdays 96 tablet 2  . LORazepam (ATIVAN) 0.5 MG  tablet Take 1 tablet (0.5 mg total) by mouth 2 (two) times daily as needed for anxiety. 15 tablet 2  . metoprolol succinate (TOPROL-XL) 50 MG 24 hr tablet 1/2 by mouth daily 45 tablet 2   No current facility-administered medications on file prior to visit.     Past Medical History:  Diagnosis Date  . Cancer [facility]) 2011   Basal Cell of nose; Dr Martinique  . Herpes zoster 2008   posterior thorax  . Hypertension   . Neurofibroma of back 2013    Dr Amy Martinique   . Osteopenia 2016   T score -1.6 FRAX not calculated because of HRT  . Psoriasis    Dr Amy Martinique  . Thyroid nodule     Past Surgical History:  Procedure Laterality Date  . ABDOMINAL HYSTERECTOMY      LSO  & appendectomy for endometrioma   @ age38 for ovarian cyst  . APPENDECTOMY    . cataract Bilateral Dec 2016;   not sure of date; wears readers only   . COLONOSCOPY  multiple  . OOPHORECTOMY     LSO for cyst  . TONSILLECTOMY      Social History   Social History  . Marital status: Married    Spouse name: N/A  . Number of children: N/A  . Years of education: N/A   Social History Main  Topics  . Smoking status: Former Research scientist (life sciences)  . Smokeless tobacco: Never Used     Comment: college onlysocially  . Alcohol use 3.0 oz/week    5 Glasses of wine per week  . Drug use: No  . Sexual activity: Yes    Birth control/ protection: Surgical     Comment: HYST-1st intercourse 71 yo-Fewer than 5 partners   Other Topics Concern  . Not on file   Social History Narrative   Lives with husband x 45 years in a two story home.  Has 2 children.     Retired Education officer, museum in [phone].     Education: college.    Family History  Problem Relation Age of Onset  . Hypertension Father   . Lung cancer Father        remote smoker, deceased 9  . Hyperlipidemia Mother        CABG in late 69s  . Hypothyroidism Mother   . Macular degeneration Mother   . Hypertension Mother   . Coronary artery disease Mother        bypass surgery  . Stroke  Mother 19       Deceased  . Coronary artery disease Maternal Grandmother   . Asthma Maternal Uncle        2  . Asthma Maternal Grandfather   . Diabetes Neg Hx   . Heart attack Neg Hx   . Colon cancer Neg Hx     Review of Systems  Constitutional: Negative for chills and fever.  Skin: Positive for color change. Negative for wound.  Neurological: Negative for numbness.       Objective:   Vitals:   10/08/16 1006  BP: 132/80  Pulse: 66  Resp: 16  Temp: 97.7 F (36.5 C)  SpO2: 99%   Filed Weights   10/08/16 1006  Weight: 101 lb (45.8 kg)   Body mass index is 18.77 kg/m.  Wt Readings from Last 3 Encounters:  10/08/16 101 lb (45.8 kg)  07/16/16 102 lb (46.3 kg)  01/17/16 102 lb (46.3 kg)     Physical Exam  Constitutional: She appears well-developed and well-nourished. No distress.  Cardiovascular: Intact distal pulses.   Neurological:  Normal sensation in left foot  Skin: She is not diaphoretic.  Mild erythema medial aspect of distal right great toenail.  Tender with light touch,  No fluctuance, no wound or discharge, no other erythema or swelling around nail          Assessment & Plan:   See Problem List for Assessment and Plan of chronic medical problems.

## 2016-10-08 ENCOUNTER — Ambulatory Visit (INDEPENDENT_AMBULATORY_CARE_PROVIDER_SITE_OTHER): Payer: PPO | Admitting: Internal Medicine

## 2016-10-08 ENCOUNTER — Encounter: Payer: Self-pay | Admitting: Internal Medicine

## 2016-10-08 VITALS — BP 132/80 | HR 66 | Temp 97.7°F | Resp 16 | Wt 101.0 lb

## 2016-10-08 DIAGNOSIS — Z23 Encounter for immunization: Secondary | ICD-10-CM | POA: Diagnosis not present

## 2016-10-08 DIAGNOSIS — L03032 Cellulitis of left toe: Secondary | ICD-10-CM | POA: Diagnosis not present

## 2016-10-08 MED ORDER — AMOXICILLIN-POT CLAVULANATE 875-125 MG PO TABS: 1.0000 | ORAL_TABLET | Freq: Two times a day (BID) | ORAL | 0 refills | Status: DC

## 2016-10-08 NOTE — Patient Instructions (Signed)
Take the antibiotic as directed.  Continue to apply neosporin and soak the toe   Call with questions or concerns.    A flu shot was given today.

## 2016-10-08 NOTE — Assessment & Plan Note (Signed)
Mild, but there is an infection that has not improved for two weeks with conservative treatment Likely secondary to ingrown nail - may need to see podiatry Start augmentin BID x 1 week Continue neosporin and soaks Call if no improvement

## 2016-12-04 ENCOUNTER — Ambulatory Visit: Payer: PPO

## 2016-12-23 NOTE — Progress Notes (Addendum)
Subjective:   Renee Santos is a 71 y.o. female who presents for Medicare Annual (Subsequent) preventive examination.  Review of Systems:  No ROS.  Medicare Wellness Visit. Additional risk factors are reflected in the social history.  Cardiac Risk Factors include: advanced age (>10men, >20 women);dyslipidemia;hypertension  Sleep patterns: has interrupted sleep, gets up 1 times nightly to void and sleeps 5-7 hours nightly.  Patient reports insomnia issues, discussed recommended sleep tips.   Home Safety/Smoke Alarms: Feels safe in home. Smoke alarms in place.  Living environment; residence and Firearm Safety: 2-story house, no firearms. Lives with husband, no needs for DME, good support system Seat Belt Safety/Bike Helmet: Wears seat belt.      Objective:     Vitals: BP 112/68   Pulse 60   Resp 20   Ht 5\' 2"  (1.575 m)   Wt 101 lb (45.8 kg)   SpO2 98%   BMI 18.47 kg/m   Body mass index is 18.47 kg/m.   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 Cancer [facility]) 2011   Basal Cell of nose; Dr Martinique  . Herpes zoster 2008   posterior thorax  . Hypertension   . Neurofibroma of back 2013    Dr Amy Martinique   . Osteopenia 2016   T score -1.6 FRAX not calculated because of HRT  . Psoriasis    Dr Amy Martinique  . Thyroid nodule    Past Surgical History:  Procedure Laterality Date  . ABDOMINAL HYSTERECTOMY      LSO  & appendectomy for endometrioma   @ age38 for ovarian cyst  . APPENDECTOMY    . cataract Bilateral Dec 2016;   not sure of date; wears readers only   . COLONOSCOPY  multiple  . OOPHORECTOMY     LSO for cyst  . TONSILLECTOMY     Family History  Problem Relation Age of Onset  . Hypertension Father   . Lung cancer Father        remote smoker, deceased  40  . Hyperlipidemia Mother        CABG in late 70s  . Hypothyroidism Mother   . Macular degeneration Mother   . Hypertension Mother   . Coronary artery disease Mother        bypass surgery  . Stroke Mother 66       Deceased  . Coronary artery disease Maternal Grandmother   . Asthma Maternal Uncle        2  . Asthma Maternal Grandfather   . Diabetes Neg Hx   . Heart attack Neg Hx   . Colon cancer Neg Hx    Social History   Substance and Sexual Activity  Sexual Activity Yes  . Birth control/protection: Surgical   Comment: HYST-1st intercourse 71 yo-Fewer than 5 partners    Outpatient Encounter Medications as of 12/24/2016  Medication Sig  . Calcium Carbonate-Vit D-Min (CALTRATE PLUS PO) Take by mouth daily.    . clobetasol cream (TEMOVATE) 6.19 % Apply 1 application topically 2 (two) times daily.  . diphenhydrAMINE (BENADRYL) 25 MG tablet Take 25 mg by mouth at bedtime as needed. To help sleep  . estradiol (ESTRACE) 0.5 MG tablet Take 1 tablet (0.5 mg total) by mouth daily.  . hydrochlorothiazide (  HYDRODIURIL) 25 MG tablet TAKE 1/2 BY MOUTH DAILY  . levothyroxine (SYNTHROID, LEVOTHROID) 50 MCG tablet Takes 1 tablet daily, except 1.5 tablets on tuesdays and thursdays  . LORazepam (ATIVAN) 0.5 MG tablet Take 1 tablet (0.5 mg total) by mouth 2 (two) times daily as needed for anxiety.  . metoprolol succinate (TOPROL-XL) 50 MG 24 hr tablet 1/2 by mouth daily  . [DISCONTINUED] amoxicillin-clavulanate (AUGMENTIN) 875-125 MG tablet Take 1 tablet by mouth 2 (two) times daily. (Patient not taking: Reported on 12/24/2016)   No facility-administered encounter medications on file as of 12/24/2016.     Activities of Daily Living In your present state of health, do you have any difficulty performing the following activities: 12/24/2016  Hearing? N  Vision? N  Difficulty concentrating or making decisions? N  Walking or climbing stairs? N  Dressing or bathing? N  Doing errands,  shopping? N  Preparing Food and eating ? N  Using the Toilet? N  In the past six months, have you accidently leaked urine? N  Do you have problems with loss of bowel control? N  Managing your Medications? N  Managing your Finances? N  Housekeeping or managing your Housekeeping? N  Some recent data might be hidden    Patient Care Team: Binnie Rail, MD as PCP - General (Internal Medicine) Martinique, Amy, MD as Consulting Physician (Dermatology) Phineas Real Belinda Block, MD as Consulting Physician (Gynecology)    Assessment:    Physical assessment deferred to PCP.  Exercise Activities and Dietary recommendations Current Exercise Habits: Home exercise routine;Structured exercise class, Type of exercise: walking;stretching(balance class), Time (Minutes): 55, Frequency (Times/Week): 5, Weekly Exercise (Minutes/Week): 275, Intensity: Mild  Diet (meal preparation, eat out, water intake, caffeinated beverages, dairy products, fruits and vegetables): in general, a "healthy" diet  , well balanced, eats a variety of fruits and vegetables daily, limits salt, fat/cholesterol, sugar, caffeine, drinks 6-8 glasses of water daily.  Goals      Patient Stated   . <enter goal here> (pt-stated)     Wants to continue to be healthy  Including walking; trips; add light weights;   Viniyoga; [email or web address] for stress reduction        Fall Risk Fall Risk  12/24/2016 07/25/2015 06/15/2014 04/13/2013  Falls in the past year? No No No No   Depression Screen PHQ 2/9 Scores 12/24/2016 07/25/2015 06/15/2014 04/13/2013  PHQ - 2 Score 0 0 0 0  PHQ- 9 Score 2 - - -     Cognitive Function MMSE - Mini Mental State Exam 12/24/2016  Orientation to time 5  Orientation to Place 5  Registration 3  Attention/ Calculation 3  Recall 2  Language- name 2 objects 2  Language- repeat 1  Language- follow 3 step command 3  Language- read & follow direction 1  Write a sentence 1  Copy design 1  Total score 27         Immunization History  Administered Date(s) Administered  . H1N1 03/01/2008  . Influenza Split 12/04/2010, 11/05/2011  . Influenza, High Dose Seasonal PF 12/27/2015, 10/08/2016  . Influenza,inj,Quad PF,6+ Mos 12/03/2012, 12/15/2013, 12/21/2014  . Pneumococcal Conjugate-13 06/15/2014  . Pneumococcal Polysaccharide-23 04/13/2013  . Tetanus 02/21/2014  . Zoster 02/18/2014   Screening Tests Health Maintenance  Topic Date Due  . MAMMOGRAM  01/29/2018  . COLONOSCOPY  12/31/2023  . TETANUS/TDAP  02/22/2024  . INFLUENZA VACCINE  Completed  . DEXA SCAN  Completed  . Hepatitis C Screening  Completed  . PNA vac  Low Risk Adult  Completed      Plan:    Start doing brain stimulating activities (puzzles, reading, adult coloring books, staying active) to keep memory sharp.   Continue to eat heart healthy diet (full of fruits, vegetables, whole grains, lean protein, water--limit salt, fat, and sugar intake) and increase physical activity as tolerated.  I have personally reviewed and noted the following in the patient's chart:   . Medical and social history . Use of alcohol, tobacco or illicit drugs  . Current medications and supplements . Functional ability and status . Nutritional status . Physical activity . Advanced directives . List of other physicians . Vitals . Screenings to include cognitive, depression, and falls . Referrals and appointments  In addition, I have reviewed and discussed with patient certain preventive protocols, quality metrics, and best practice recommendations. A written personalized care plan for preventive services as well as general preventive health recommendations were provided to patient.     Michiel Cowboy, RN  12/24/2016   Medical screening examination/treatment/procedure(s) were performed by non-physician practitioner and as supervising physician I was immediately available for consultation/collaboration. I agree with above. Binnie Rail,  MD

## 2016-12-24 ENCOUNTER — Ambulatory Visit (INDEPENDENT_AMBULATORY_CARE_PROVIDER_SITE_OTHER): Payer: PPO | Admitting: *Deleted

## 2016-12-24 ENCOUNTER — Telehealth: Payer: Self-pay | Admitting: *Deleted

## 2016-12-24 VITALS — BP 112/68 | HR 60 | Resp 20 | Ht 62.0 in | Wt 101.0 lb

## 2016-12-24 DIAGNOSIS — Z0000 Encounter for general adult medical examination without abnormal findings: Secondary | ICD-10-CM | POA: Diagnosis not present

## 2016-12-24 NOTE — Telephone Encounter (Signed)
Called to inform patient that PCP would like to see patient for yearly medication refills and evaluation. Scheduled visit for 01/08/17.

## 2016-12-24 NOTE — Patient Instructions (Addendum)
Start doing brain stimulating activities (puzzles, reading, adult coloring books, staying active) to keep memory sharp.   Continue to eat heart healthy diet (full of fruits, vegetables, whole grains, lean protein, water--limit salt, fat, and sugar intake) and increase physical activity as tolerated.   Renee Santos , Thank you for taking time to come for your Medicare Wellness Visit. I appreciate your ongoing commitment to your health goals. Please review the following plan we discussed and let me know if I can assist you in the future.   These are the goals we discussed: Work on balance and upper body strength, continue to take exercise classes, walk daily, travel, enjoy life and family. Goals      Patient Stated   . <enter goal here> (pt-stated)     Wants to continue to be healthy  Including walking; trips; add light weights;   Viniyoga; [email or web address] for stress reduction         This is a list of the screening recommended for you and due dates:  Health Maintenance  Topic Date Due  . Mammogram  01/29/2018  . Colon Cancer Screening  12/31/2023  . Tetanus Vaccine  02/22/2024  . Flu Shot  Completed  . DEXA scan (bone density measurement)  Completed  .  Hepatitis C: One time screening is recommended by Center for Disease Control  (CDC) for  adults born from 18 through 1963.   Completed  . Pneumonia vaccines  Completed       \] Diphenhydramine capsules or tablets [Insomnia] What is this medicine? DIPHENHYDRAMINE (dye fen HYE dra meen) is an antihistamine. This medicine is used to treat occasional sleeplesness. This medicine may be used for other purposes; ask your health care provider or pharmacist if you have questions. COMMON BRAND NAME(S): Aid to Sleep, Compoz Nighttime Sleep Aid, Nighttime Sleep Aid, Nytol, Simply Sleep, Sleep Tabs, Sominex, Unisom, Vicks Qlearquil Nighttime Allergy Relief, Vicks ZzzQuil Nightime Sleep-Aid What should I tell my health care provider before  I take this medicine? They need to know if you have any of these conditions: -glaucoma -high blood pressure or heart disease -liver disease -lung or breathing disease, like asthma -pain or trouble passing urine -prostate trouble -ulcers or other stomach problems -an unusual or allergic reaction to diphenhydramine, other medicines foods, dyes, or preservatives such as sulfites -pregnant or trying to get pregnant -breast-feeding How should I use this medicine? Take this medicine by mouth with a full glass of water. Follow the directions on the prescription label. Do not take your medicine more often than directed. Talk to your pediatrician regarding the use of this medicine in children. While this drug may be prescribed for children as young as 53 years old for selected conditions, precautions do apply. Patients over 16 years old may have a stronger reaction and need a smaller dose. Overdosage: If you think you have taken too much of this medicine contact a poison control center or emergency room at once. NOTE: This medicine is only for you. Do not share this medicine with others. What if I miss a dose? If you miss a dose, take it as soon as you can. If it is almost time for your next dose, take only that dose. Do not take double or extra doses. What may interact with this medicine? Do not take this medicine with any of the following medications: -MAOIs like Carbex, Eldepryl, Marplan, Nardil, and Parnate This medicine may also interact with the following medications: -alcohol -barbiturates like phenobarbital -medicines for  bladder spasm like oxybutynin, tolterodine -medicines for blood pressure -medicines for depression, anxiety, or psychotic disturbances -medicines for movement abnormalities or Parkinson's disease -medicines for sleep -other medicines for cold, cough, or allergy -some medicines for the stomach like chlordiazepoxide, dicyclomine This list may not describe all possible  interactions. Give your health care provider a list of all the medicines, herbs, non-prescription drugs, or dietary supplements you use. Also tell them if you smoke, drink alcohol, or use illegal drugs. Some items may interact with your medicine. What should I watch for while using this medicine? Visit your doctor or health care professional for regular check ups. Tell your doctor or health care professional if your symptoms do not start to get better or if they get worse. Your mouth may get dry. Chewing sugarless gum or sucking hard candy, and drinking plenty of water may help. Contact your doctor if the problem does not go away or is severe. This medicine may cause dry eyes and blurred vision. If you wear contact lenses you may feel some discomfort. Lubricating drops may help. See your eye doctor if the problem does not go away or is severe. You may get drowsy or dizzy. Do not drive, use machinery, or do anything that needs mental alertness until you know how this medicine affects you. Do not stand or sit up quickly, especially if you are an older patient. This reduces the risk of dizzy or fainting spells. Alcohol may interfere with the effect of this medicine. Avoid alcoholic drinks. What side effects may I notice from receiving this medicine? Side effects that you should report to your doctor or health care professional as soon as possible: -allergic reactions like skin rash, itching or hives, swelling of the face, lips, or tongue -changes in vision -confused, agitated, or nervous Insomnia Insomnia is a sleep disorder that makes it difficult to fall asleep or to stay asleep. Insomnia can cause tiredness (fatigue), low energy, difficulty concentrating, mood swings, and poor performance at work or school. There are three different ways to classify insomnia:  Difficulty falling asleep.  Difficulty staying asleep.  Waking up too early in the morning.  Any type of insomnia can be long-term  (chronic) or short-term (acute). Both are common. Short-term insomnia usually lasts for three months or less. Chronic insomnia occurs at least three times a week for longer than three months. What are the causes? Insomnia may be caused by another condition, situation, or substance, such as:  Anxiety.  Certain medicines.  Gastroesophageal reflux disease (GERD) or other gastrointestinal conditions.  Asthma or other breathing conditions.  Restless legs syndrome, sleep apnea, or other sleep disorders.  Chronic pain.  Menopause. This may include hot flashes.  Stroke.  Abuse of alcohol, tobacco, or illegal drugs.  Depression.  Caffeine.  Neurological disorders, such as Alzheimer disease.  An overactive thyroid (hyperthyroidism).  The cause of insomnia may not be known. What increases the risk? Risk factors for insomnia include:  Gender. Women are more commonly affected than men.  Age. Insomnia is more common as you get older.  Stress. This may involve your professional or personal life.  Income. Insomnia is more common in people with lower income.  Lack of exercise.  Irregular work schedule or night shifts.  Traveling between different time zones.  What are the signs or symptoms? If you have insomnia, trouble falling asleep or trouble staying asleep is the main symptom. This may lead to other symptoms, such as:  Feeling fatigued.  Feeling nervous about  going to sleep.  Not feeling rested in the morning.  Having trouble concentrating.  Feeling irritable, anxious, or depressed.  How is this treated? Treatment for insomnia depends on the cause. If your insomnia is caused by an underlying condition, treatment will focus on addressing the condition. Treatment may also include:  Medicines to help you sleep.  Counseling or therapy.  Lifestyle adjustments.  Follow these instructions at home:  Take medicines only as directed by your health care  provider.  Keep regular sleeping and waking hours. Avoid naps.  Keep a sleep diary to help you and your health care provider figure out what could be causing your insomnia. Include: ? When you sleep. ? When you wake up during the night. ? How well you sleep. ? How rested you feel the next day. ? Any side effects of medicines you are taking. ? What you eat and drink.  Make your bedroom a comfortable place where it is easy to fall asleep: ? Put up shades or special blackout curtains to block light from outside. ? Use a white noise machine to block noise. ? Keep the temperature cool.  Exercise regularly as directed by your health care provider. Avoid exercising right before bedtime.  Use relaxation techniques to manage stress. Ask your health care provider to suggest some techniques that may work well for you. These may include: ? Breathing exercises. ? Routines to release muscle tension. ? Visualizing peaceful scenes.  Cut back on alcohol, caffeinated beverages, and cigarettes, especially close to bedtime. These can disrupt your sleep.  Do not overeat or eat spicy foods right before bedtime. This can lead to digestive discomfort that can make it hard for you to sleep.  Limit screen use before bedtime. This includes: ? Watching TV. ? Using your smartphone, tablet, and computer.  Stick to a routine. This can help you fall asleep faster. Try to do a quiet activity, brush your teeth, and go to bed at the same time each night.  Get out of bed if you are still awake after 15 minutes of trying to sleep. Keep the lights down, but try reading or doing a quiet activity. When you feel sleepy, go back to bed.  Make sure that you drive carefully. Avoid driving if you feel very sleepy.  Keep all follow-up appointments as directed by your health care provider. This is important. Contact a health care provider if:  You are tired throughout the day or have trouble in your daily routine due to  sleepiness.  You continue to have sleep problems or your sleep problems get worse. Get help right away if:  You have serious thoughts about hurting yourself or someone else. This information is not intended to replace advice given to you by your health care provider. Make sure you discuss any questions you have with your health care provider. Document Released: 01/26/2000 Document Revised: 06/30/2015 Document Reviewed: 10/29/2013 Elsevier Interactive Patient Education  2018 [employer]. -fast, irregular heartbeat -tremor -trouble passing urine or change in the amount of urine -unusual bleeding or bruising -unusually weak or tired Side effects that usually do not require medical attention (report to your doctor or health care professional if they continue or are bothersome): -constipation, diarrhea -drowsy -headache -loss of appetite -stomach upset, vomiting -thick mucus This list may not describe all possible side effects. Call your doctor for medical advice about side effects. You may report side effects to FDA at 1-800-FDA-1088. Where should I keep my medicine? Keep out of the  reach of children. Store at room temperature between 20 and 25 degrees C (68 and 77 degrees F). Keep container closed tightly. Throw away any unused medicine after the expiration date. NOTE: This sheet is a summary. It may not cover all possible information. If you have questions about this medicine, talk to your doctor, pharmacist, or health care provider.  2018 Elsevier/Gold Standard (2007-05-29 16:14:00)

## 2017-01-07 ENCOUNTER — Encounter: Payer: Self-pay | Admitting: Family Medicine

## 2017-01-07 ENCOUNTER — Ambulatory Visit: Payer: PPO | Admitting: Family Medicine

## 2017-01-07 VITALS — BP 139/80 | HR 77 | Temp 97.8°F | Ht 62.0 in | Wt 103.4 lb

## 2017-01-07 DIAGNOSIS — R059 Cough, unspecified: Secondary | ICD-10-CM

## 2017-01-07 DIAGNOSIS — R509 Fever, unspecified: Secondary | ICD-10-CM | POA: Diagnosis not present

## 2017-01-07 DIAGNOSIS — R05 Cough: Secondary | ICD-10-CM

## 2017-01-07 LAB — POCT INFLUENZA A/B
INFLUENZA A, POC: NEGATIVE
INFLUENZA B, POC: NEGATIVE

## 2017-01-07 MED ORDER — IPRATROPIUM BROMIDE 0.06 % NA SOLN: 2.0000 | Freq: Four times a day (QID) | NASAL | 12 refills | Status: DC

## 2017-01-07 NOTE — Patient Instructions (Addendum)
  Test(s) ordered today. Your results will be released to MyChart (or called to you) after review, usually within 72hours after test completion. If any changes need to be made, you will be notified at that same time.  All other Health Maintenance issues reviewed.   All recommended immunizations and age-appropriate screenings are up-to-date or discussed.  No immunizations administered today.   Medications reviewed and updated.  No changes recommended at this time.   Please followup in one year   

## 2017-01-07 NOTE — Progress Notes (Signed)
Subjective:    Patient ID: Renee Santos, female    DOB: 04/29/1945, 71 y.o.   MRN: [id number]  HPI The patient is here for follow up.  Hypertension: She is taking her medication daily. She is compliant with a low sodium diet.  She has had a couple of episodes of twinges in her chest that lasted only 1 second.  She has not had them in a while.  She denies chest pain, palpitations, edema, shortness of breath and regular headaches. She is exercising regularly.     Occasionally she has funny twinges in her chest but not when she exercises.  It is transient and she has not had it in a while.  Her mom and grandmother had heart disease.  She walks regularly and does a flex and balance class.    Hypothyroidism:  She is taking her medication daily.  She denies any recent changes in energy or weight that are unexplained.   Situational anxiety:  She is taking ativan only as needed.    Cold symptoms:  She was seen by Dr Jerline Pain yesterday and was diagnosed with a viral cold.  She is using a nasal spray and that seems to be helping.  She is feeling better today.    Medications and allergies reviewed with patient and updated if appropriate.  Patient Active Problem List   Diagnosis Date Noted  . Paronychia of great toe, left 10/08/2016  . Hyperglycemia 07/16/2016  . Dizziness 07/16/2016  . Lightheadedness 07/16/2016  . Calcific tendonitis of right hand 05/01/2015  . Herpes simplex 05/01/2015  . Situational anxiety 05/01/2015  . Essential hypertension 03/01/2008  . Osteopenia 03/01/2008  . THYROID NODULE 01/13/2007  . Hypothyroidism 01/13/2007  . HYPERLIPIDEMIA 01/13/2007  . PSORIASIS 01/13/2007    Current Outpatient Medications on File Prior to Visit  Medication Sig Dispense Refill  . Calcium Carbonate-Vit D-Min (CALTRATE PLUS PO) Take by mouth daily.      . clobetasol cream (TEMOVATE) 8.10 % Apply 1 application topically 2 (two) times daily. 45 g 1  . diphenhydrAMINE (BENADRYL) 25 MG  tablet Take 25 mg by mouth at bedtime as needed. To help sleep    . estradiol (ESTRACE) 0.5 MG tablet Take 1 tablet (0.5 mg total) by mouth daily. 90 tablet 3  . hydrochlorothiazide (HYDRODIURIL) 25 MG tablet TAKE 1/2 BY MOUTH DAILY 45 tablet 2  . ipratropium (ATROVENT) 0.06 % nasal spray Place 2 sprays into both nostrils 4 (four) times daily. 15 mL 12  . levothyroxine (SYNTHROID, LEVOTHROID) 50 MCG tablet Takes 1 tablet daily, except 1.5 tablets on tuesdays and thursdays 96 tablet 2  . LORazepam (ATIVAN) 0.5 MG tablet Take 1 tablet (0.5 mg total) by mouth 2 (two) times daily as needed for anxiety. 15 tablet 2  . metoprolol succinate (TOPROL-XL) 50 MG 24 hr tablet 1/2 by mouth daily 45 tablet 2   No current facility-administered medications on file prior to visit.     Past Medical History:  Diagnosis Date  . Cancer [facility]) 2011   Basal Cell of nose; Dr Martinique  . Herpes zoster 2008   posterior thorax  . Hypertension   . Neurofibroma of back 2013    Dr Amy Martinique   . Osteopenia 2016   T score -1.6 FRAX not calculated because of HRT  . Psoriasis    Dr Amy Martinique  . Thyroid nodule     Past Surgical History:  Procedure Laterality Date  . ABDOMINAL HYSTERECTOMY  LSO  & appendectomy for endometrioma   @ age38 for ovarian cyst  . APPENDECTOMY    . cataract Bilateral Dec 2016;   not sure of date; wears readers only   . COLONOSCOPY  multiple  . OOPHORECTOMY     LSO for cyst  . TONSILLECTOMY      Social History   Socioeconomic History  . Marital status: Married    Spouse name: None  . Number of children: None  . Years of education: None  . Highest education level: None  Social Needs  . Financial resource strain: None  . Food insecurity - worry: None  . Food insecurity - inability: None  . Transportation needs - medical: None  . Transportation needs - non-medical: None  Occupational History  . None  Tobacco Use  . Smoking status: Former Research scientist (life sciences)  . Smokeless tobacco:  Never Used  . Tobacco comment: college onlysocially  Substance and Sexual Activity  . Alcohol use: Yes    Alcohol/week: 3.0 oz    Types: 5 Glasses of wine per week  . Drug use: No  . Sexual activity: Yes    Birth control/protection: Surgical    Comment: HYST-1st intercourse 71 yo-Fewer than 5 partners  Other Topics Concern  . None  Social History Narrative   Lives with husband x 45 years in a two story home.  Has 2 children.     Retired Education officer, museum in [phone].     Education: college.    Family History  Problem Relation Age of Onset  . Hypertension Father   . Lung cancer Father        remote smoker, deceased 38  . Hyperlipidemia Mother        CABG in late 53s  . Hypothyroidism Mother   . Macular degeneration Mother   . Hypertension Mother   . Coronary artery disease Mother        bypass surgery  . Stroke Mother 5       Deceased  . Coronary artery disease Maternal Grandmother   . Asthma Maternal Uncle        2  . Asthma Maternal Grandfather   . Diabetes Neg Hx   . Heart attack Neg Hx   . Colon cancer Neg Hx     Review of Systems  Constitutional: Positive for fatigue. Negative for chills and fever (none today).  HENT: Positive for postnasal drip and voice change. Negative for congestion, ear pain, sinus pressure, sinus pain and sore throat.   Respiratory: Positive for cough (mild). Negative for shortness of breath and wheezing.   Cardiovascular: Positive for chest pain (occ twinges - last one sec). Negative for palpitations and leg swelling.  Musculoskeletal: Positive for myalgias.  Neurological: Positive for headaches. Negative for light-headedness.       Objective:   Vitals:   01/08/17 1044  BP: 114/84  Pulse: 72  Resp: 16  Temp: 98.2 F (36.8 C)  SpO2: 98%   Wt Readings from Last 3 Encounters:  01/08/17 102 lb (46.3 kg)  01/07/17 103 lb 6.4 oz (46.9 kg)  12/24/16 101 lb (45.8 kg)   Body mass index is 18.66 kg/m.   Physical Exam      Constitutional: Appears well-developed and well-nourished. No distress.  HENT:  Head: Normocephalic and atraumatic.  Neck: Neck supple. No tracheal deviation present. No thyromegaly present.  No cervical lymphadenopathy.  Bilateral ear canals and tympanic membranes normal.  No oropharynx erythema. Cardiovascular: Normal rate, regular rhythm and normal  heart sounds.   No murmur heard. No carotid bruit .  No edema Pulmonary/Chest: Effort normal and breath sounds normal. No respiratory distress. No has no wheezes. No rales.  Skin: Skin is warm and dry. Not diaphoretic.  Psychiatric: Normal mood and affect. Behavior is normal.      Assessment & Plan:    See Problem List for Assessment and Plan of chronic medical problems.

## 2017-01-07 NOTE — Progress Notes (Signed)
    Subjective:  Renee Santos is a 71 y.o. female who presents today with a chief complaint of cough.   HPI:  Cough, acute issue Symptoms started yesterday.  Worsening over that time.  Associated symptoms include myalgias, rhinorrhea, and subjective fevers.  She took some Tylenol which helped some.  No known sick contacts.  No other treatments tried.  No other obvious alleviating or aggravating factors.  No nausea or vomiting.  ROS: Per HPI  PMH: Smoking history reviewed.  Former smoker.  Objective:  Physical Exam: BP 139/80   Pulse 77   Temp 97.8 F (36.6 C) (Oral)   Ht 5\' 2"  (1.575 m)   Wt 103 lb 6.4 oz (46.9 kg)   SpO2 97%   BMI 18.91 kg/m   Gen: NAD, resting comfortably HEENT: Right EAC with impacted cerumen.  Left EAC clear.  Left TM clear.  Maxillary sinuses with normal transillumination bilaterally.  Frontal sinus with normal transillumination.  Oropharynx erythematous without exudate.  Nasal mucosa erythematous and boggy bilaterally. CV: RRR with no murmurs appreciated Pulm: NWOB, CTAB with no crackles, wheezes, or rhonchi  Results for orders placed or performed in visit on 01/07/17 (from the past 72 hour(s))  POCT Influenza A/B     Status: None   Collection Time: 01/07/17  4:22 PM  Result Value Ref Range   Influenza A, POC Negative Negative   Influenza B, POC Negative Negative    Assessment/Plan:  Cough No signs or symptoms of bacterial infection.  Rapid flu negative.  Likely viral upper respiratory infection.  Start Atrovent nasal spray.  Encouraged continued use of Tylenol and/or Motrin as needed for low-grade fever and pain.  Patient deferred cough medication.  Encouraged good oral hydration.  Return precautions reviewed.  Follow-up as needed.  Algis Greenhouse. Jerline Pain, MD 01/07/2017 4:53 PM

## 2017-01-08 ENCOUNTER — Ambulatory Visit (INDEPENDENT_AMBULATORY_CARE_PROVIDER_SITE_OTHER): Payer: PPO | Admitting: Internal Medicine

## 2017-01-08 ENCOUNTER — Encounter: Payer: Self-pay | Admitting: Internal Medicine

## 2017-01-08 VITALS — BP 114/84 | HR 72 | Temp 98.2°F | Resp 16 | Ht 62.0 in | Wt 102.0 lb

## 2017-01-08 DIAGNOSIS — I10 Essential (primary) hypertension: Secondary | ICD-10-CM

## 2017-01-08 DIAGNOSIS — R739 Hyperglycemia, unspecified: Secondary | ICD-10-CM | POA: Diagnosis not present

## 2017-01-08 DIAGNOSIS — E038 Other specified hypothyroidism: Secondary | ICD-10-CM | POA: Diagnosis not present

## 2017-01-08 DIAGNOSIS — E782 Mixed hyperlipidemia: Secondary | ICD-10-CM | POA: Diagnosis not present

## 2017-01-08 DIAGNOSIS — F418 Other specified anxiety disorders: Secondary | ICD-10-CM | POA: Diagnosis not present

## 2017-01-08 NOTE — Assessment & Plan Note (Signed)
BP well controlled Current regimen effective and well tolerated Continue current medications at current doses cmp  

## 2017-01-08 NOTE — Assessment & Plan Note (Signed)
Check lipid panel Continue healthy diet and regular exercise

## 2017-01-08 NOTE — Assessment & Plan Note (Signed)
Check tsh  Titrate med dose if needed  

## 2017-01-08 NOTE — Assessment & Plan Note (Signed)
Takes Ativan only as needed, not often No side effects We will continue-advised to use only as needed

## 2017-01-08 NOTE — Assessment & Plan Note (Signed)
Check a1c 

## 2017-01-09 ENCOUNTER — Other Ambulatory Visit (INDEPENDENT_AMBULATORY_CARE_PROVIDER_SITE_OTHER): Payer: PPO

## 2017-01-09 DIAGNOSIS — E782 Mixed hyperlipidemia: Secondary | ICD-10-CM

## 2017-01-09 DIAGNOSIS — R739 Hyperglycemia, unspecified: Secondary | ICD-10-CM

## 2017-01-09 DIAGNOSIS — I10 Essential (primary) hypertension: Secondary | ICD-10-CM

## 2017-01-09 DIAGNOSIS — E038 Other specified hypothyroidism: Secondary | ICD-10-CM | POA: Diagnosis not present

## 2017-01-09 LAB — COMPREHENSIVE METABOLIC PANEL
ALBUMIN: 3.8 g/dL (ref 3.5–5.2)
ALT: 13 U/L (ref 0–35)
AST: 19 U/L (ref 0–37)
Alkaline Phosphatase: 45 U/L (ref 39–117)
BUN: 21 mg/dL (ref 6–23)
CHLORIDE: 104 meq/L (ref 96–112)
CO2: 26 meq/L (ref 19–32)
Calcium: 9 mg/dL (ref 8.4–10.5)
Creatinine, Ser: 1.09 mg/dL (ref 0.40–1.20)
GFR: 52.79 mL/min — ABNORMAL LOW (ref >60.00)
GLUCOSE: 94 mg/dL (ref 70–99)
POTASSIUM: 4.6 meq/L (ref 3.5–5.1)
SODIUM: 138 meq/L (ref 135–145)
Total Bilirubin: 0.4 mg/dL (ref 0.2–1.2)
Total Protein: 6.8 g/dL (ref 6.0–8.3)

## 2017-01-09 LAB — LIPID PANEL
Cholesterol: 195 mg/dL (ref 0–200)
HDL: 82.3 mg/dL (ref >39.00)
LDL CALC: 98 mg/dL (ref 0–99)
NONHDL: 112.69
Total CHOL/HDL Ratio: 2
Triglycerides: 74 mg/dL (ref 0.0–149.0)
VLDL: 14.8 mg/dL (ref 0.0–40.0)

## 2017-01-09 LAB — CBC WITH DIFFERENTIAL/PLATELET
BASOS PCT: 0.8 % (ref 0.0–3.0)
Basophils Absolute: 0 10*3/uL (ref 0.0–0.1)
EOS ABS: 0 10*3/uL (ref 0.0–0.7)
EOS PCT: 1.1 % (ref 0.0–5.0)
HCT: 39.5 % (ref 36.0–46.0)
Hemoglobin: 13.3 g/dL (ref 12.0–15.0)
LYMPHS ABS: 1.4 10*3/uL (ref 0.7–4.0)
Lymphocytes Relative: 31.8 % (ref 12.0–46.0)
MCHC: 33.7 g/dL (ref 30.0–36.0)
MCV: 96.3 fl (ref 78.0–100.0)
MONO ABS: 0.4 10*3/uL (ref 0.1–1.0)
Monocytes Relative: 8.2 % (ref 3.0–12.0)
NEUTROS ABS: 2.5 10*3/uL (ref 1.4–7.7)
NEUTROS PCT: 58.1 % (ref 43.0–77.0)
PLATELETS: 206 10*3/uL (ref 150.0–400.0)
RBC: 4.1 Mil/uL (ref 3.87–5.11)
RDW: 11.7 % (ref 11.5–15.5)
WBC: 4.4 10*3/uL (ref 4.0–10.5)

## 2017-01-09 LAB — TSH: TSH: 6.6 u[IU]/mL — ABNORMAL HIGH (ref 0.35–4.50)

## 2017-01-09 LAB — HEMOGLOBIN A1C: HEMOGLOBIN A1C: 5.4 % (ref 4.6–6.5)

## 2017-01-13 ENCOUNTER — Telehealth: Payer: Self-pay | Admitting: Internal Medicine

## 2017-01-13 ENCOUNTER — Encounter: Payer: Self-pay | Admitting: Internal Medicine

## 2017-01-13 DIAGNOSIS — E039 Hypothyroidism, unspecified: Secondary | ICD-10-CM

## 2017-01-13 DIAGNOSIS — I10 Essential (primary) hypertension: Secondary | ICD-10-CM

## 2017-01-13 NOTE — Telephone Encounter (Signed)
Copied from [place]. Topic: Quick Communication - Lab Results >> Jan 13, 2017  1:46 PM Synthia Innocent wrote: Would like to speak to some one regarding lab results, Cholesterol and thyroid levels. Also would like refill on metoprolol succinate (TOPROL-XL) 50 MG 24 hr tablet, levothyroxine (SYNTHROID, LEVOTHROID) 50 MCG tablet and hydrochlorothiazide (HYDRODIURIL) 25 MG tablet.

## 2017-01-15 MED ORDER — LEVOTHYROXINE SODIUM 75 MCG PO TABS: 75.0000 ug | ORAL_TABLET | Freq: Every day | ORAL | 1 refills | Status: DC

## 2017-01-15 MED ORDER — METOPROLOL SUCCINATE ER 50 MG PO TB24: ORAL_TABLET | ORAL | 3 refills | Status: DC

## 2017-01-15 MED ORDER — HYDROCHLOROTHIAZIDE 25 MG PO TABS: ORAL_TABLET | ORAL | 3 refills | Status: DC

## 2017-01-15 MED ORDER — METOPROLOL SUCCINATE ER 50 MG PO TB24: ORAL_TABLET | ORAL | 2 refills | Status: DC

## 2017-01-15 MED ORDER — HYDROCHLOROTHIAZIDE 25 MG PO TABS: ORAL_TABLET | ORAL | 2 refills | Status: DC

## 2017-01-15 NOTE — Addendum Note (Signed)
Addended by: Terence Lux B on: 01/15/2017 04:36 PM   Modules accepted: Orders

## 2017-01-15 NOTE — Telephone Encounter (Signed)
Spoke with pt

## 2017-01-15 NOTE — Telephone Encounter (Signed)
Spoke with pt, RXs sent to correct pharmacy, lab ordered.

## 2017-01-15 NOTE — Telephone Encounter (Signed)
Refilled meds except for levothyroxine. Last TSH abnormal. Office visit: 01/08/17 last refills:05/20/16  Pt requesting recent lab results. [facility] does not have permission to release

## 2017-01-15 NOTE — Telephone Encounter (Signed)
LVM for pt to call back and discuss.  

## 2017-01-15 NOTE — Telephone Encounter (Signed)
Refilled meds except for levothyroxine. Last TSH abnormal. Office visit: 01/08/17 last refills:05/20/16  Pt requesting recent lab results. [facility] does not have permission to release  Routing comment to office

## 2017-01-22 ENCOUNTER — Encounter: Payer: Medicare Other | Admitting: Gynecology

## 2017-01-22 ENCOUNTER — Telehealth: Payer: Self-pay | Admitting: *Deleted

## 2017-01-22 NOTE — Telephone Encounter (Signed)
Rec'd call from [facility] stating received script for 2 Levothyroxine. Need to clarify which dosage pt should be taking. Inform Valerie per chart as of 01/09/17 MD increase Levothroxine to 75 mcg. Can d/c order for 50 mcg. Updated med list.../lmb

## 2017-01-23 ENCOUNTER — Encounter: Payer: Self-pay | Admitting: Family Medicine

## 2017-01-23 ENCOUNTER — Ambulatory Visit: Payer: PPO | Admitting: Family Medicine

## 2017-01-23 VITALS — BP 122/80 | HR 63 | Temp 97.7°F | Resp 16 | Ht 62.0 in | Wt 103.0 lb

## 2017-01-23 DIAGNOSIS — R05 Cough: Secondary | ICD-10-CM | POA: Diagnosis not present

## 2017-01-23 DIAGNOSIS — R059 Cough, unspecified: Secondary | ICD-10-CM

## 2017-01-23 MED ORDER — AZITHROMYCIN 250 MG PO TABS
ORAL_TABLET | ORAL | 0 refills | Status: DC
Start: 2017-01-23 — End: 2017-02-12

## 2017-01-23 MED ORDER — BENZONATATE 200 MG PO CAPS: 200.0000 mg | ORAL_CAPSULE | Freq: Two times a day (BID) | ORAL | 0 refills | Status: DC | PRN

## 2017-01-23 NOTE — Patient Instructions (Signed)
Start the azithromycin and tessalon.  Let us know if not improving in 5-7 days, or if worsening.  Take care,  Dr Jerline Pain

## 2017-01-23 NOTE — Progress Notes (Signed)
    Subjective:  Renee Santos is a 71 y.o. female who presents today with a chief complaint of cough.   HPI:  Cough, established problem, worsening Patient seen about 3 weeks ago for viral upper respiratory infection.  She was started on Atrovent nasal spray which helped with her rhinorrhea and sinus congestion.  Over the last 3 days her cough however has worsened.  She has been coughing up dark brown mucus.  No other treatments tried.  Cough is worse at night and keeps her up.  No other obvious alleviating or aggravating factors.  No fevers or chills.  ROS: Per HPI  Objective:  Physical Exam: BP 122/80 (BP Location: Left Arm, Patient Position: Sitting, Cuff Size: Normal)   Pulse 63   Temp 97.7 F (36.5 C) (Oral)   Resp 16   Ht 5\' 2"  (1.575 m)   Wt 103 lb (46.7 kg)   SpO2 98%   BMI 18.84 kg/m   Gen: NAD, resting comfortably HEENT: TMs clear bilaterally.  Maxillary sinuses clear to transillumination bilaterally.  Oropharynx clear without exudate. CV: RRR with no murmurs appreciated Pulm: NWOB, mild rhonchorous breath sounds noted throughout.  Assessment/Plan:  Cough Symptoms have now persisted for about 3 weeks.  Improved then worsened over the last week.  It is reasonable to start antibiotics today.  Start azithromycin.  Will also start Tessalon for cough.  Continue Tylenol and/or Motrin for pain and low-grade fever.  Encouraged good oral hydration.  Return precautions reviewed.  Follow-up as needed.  Algis Greenhouse. Jerline Pain, MD 01/23/2017 8:49 AM

## 2017-01-30 ENCOUNTER — Encounter: Payer: Medicare Other | Admitting: Gynecology

## 2017-01-30 DIAGNOSIS — Z1231 Encounter for screening mammogram for malignant neoplasm of breast: Secondary | ICD-10-CM | POA: Diagnosis not present

## 2017-01-30 DIAGNOSIS — M8589 Other specified disorders of bone density and structure, multiple sites: Secondary | ICD-10-CM | POA: Diagnosis not present

## 2017-01-30 LAB — HM MAMMOGRAPHY

## 2017-01-30 LAB — HM DEXA SCAN

## 2017-01-31 ENCOUNTER — Other Ambulatory Visit: Payer: Self-pay | Admitting: Internal Medicine

## 2017-01-31 ENCOUNTER — Encounter: Payer: Self-pay | Admitting: Internal Medicine

## 2017-01-31 NOTE — Telephone Encounter (Signed)
[place] Controlled Substance Database checked. Last filled on 02/21/16

## 2017-02-12 ENCOUNTER — Telehealth: Payer: Self-pay | Admitting: Internal Medicine

## 2017-02-12 NOTE — Telephone Encounter (Signed)
dexa shows osteopenia  Your bone density scan showed that you have osteopenia, which is some thinning of your bones. This does increase your risk of a fracture.   To maintain your bone density you should exercise regularly and make sure you are getting about 1000-2000 units of vitamin D daily. Ideally you should be getting approximately 1200 mg of calcium daily   You should have another bone density scan in two years to reevaluate.

## 2017-02-13 NOTE — Telephone Encounter (Signed)
Spoke with pt, pt requested results be sent via [place]. MyChart reactivated and password changed. Pt is aware.

## 2017-02-14 ENCOUNTER — Encounter: Payer: Self-pay | Admitting: Internal Medicine

## 2017-02-27 ENCOUNTER — Encounter: Payer: Self-pay | Admitting: Internal Medicine

## 2017-03-05 ENCOUNTER — Encounter: Payer: Self-pay | Admitting: Internal Medicine

## 2017-03-05 ENCOUNTER — Other Ambulatory Visit (INDEPENDENT_AMBULATORY_CARE_PROVIDER_SITE_OTHER): Payer: PPO

## 2017-03-05 DIAGNOSIS — E039 Hypothyroidism, unspecified: Secondary | ICD-10-CM | POA: Diagnosis not present

## 2017-03-05 DIAGNOSIS — H5212 Myopia, left eye: Secondary | ICD-10-CM | POA: Diagnosis not present

## 2017-03-05 DIAGNOSIS — H52221 Regular astigmatism, right eye: Secondary | ICD-10-CM | POA: Diagnosis not present

## 2017-03-05 DIAGNOSIS — H524 Presbyopia: Secondary | ICD-10-CM | POA: Diagnosis not present

## 2017-03-05 LAB — TSH: TSH: 0.81 u[IU]/mL (ref 0.35–4.50)

## 2017-04-10 ENCOUNTER — Encounter: Payer: PPO | Admitting: Gynecology

## 2017-04-15 ENCOUNTER — Ambulatory Visit: Payer: PPO | Admitting: Gynecology

## 2017-04-15 ENCOUNTER — Encounter: Payer: Self-pay | Admitting: Gynecology

## 2017-04-15 VITALS — BP 122/78 | Ht 61.5 in | Wt 102.0 lb

## 2017-04-15 DIAGNOSIS — Z1272 Encounter for screening for malignant neoplasm of vagina: Secondary | ICD-10-CM

## 2017-04-15 DIAGNOSIS — Z79890 Hormone replacement therapy (postmenopausal): Secondary | ICD-10-CM

## 2017-04-15 DIAGNOSIS — Z01411 Encounter for gynecological examination (general) (routine) with abnormal findings: Secondary | ICD-10-CM

## 2017-04-15 DIAGNOSIS — M8589 Other specified disorders of bone density and structure, multiple sites: Secondary | ICD-10-CM

## 2017-04-15 DIAGNOSIS — M8580 Other specified disorders of bone density and structure, unspecified site: Secondary | ICD-10-CM

## 2017-04-15 DIAGNOSIS — N952 Postmenopausal atrophic vaginitis: Secondary | ICD-10-CM

## 2017-04-15 MED ORDER — ESTRADIOL 0.5 MG PO TABS: 0.5000 mg | ORAL_TABLET | Freq: Every day | ORAL | 4 refills | Status: DC

## 2017-04-15 NOTE — Progress Notes (Signed)
    Renee Santos 1946/01/01 [id number]        72 y.o.  G2P2 for breast and pelvic exam  Past medical history,surgical history, problem list, medications, allergies, family history and social history were all reviewed and documented as reviewed in the EPIC chart.  ROS:  Performed with pertinent positives and negatives included in the history, assessment and plan.   Additional significant findings : None   Exam: Caryn Bee assistant Vitals:   04/15/17 1607  BP: 122/78  Weight: 102 lb (46.3 kg)  Height: 5' 1.5" (1.562 m)   Body mass index is 18.96 kg/m.  General appearance:  Normal affect, orientation and appearance. Skin: Grossly normal HEENT: Without gross lesions.  No cervical or supraclavicular adenopathy. Thyroid normal.  Lungs:  Clear without wheezing, rales or rhonchi Cardiac: RR, without RMG Abdominal:  Soft, nontender, without masses, guarding, rebound, organomegaly or hernia Breasts:  Examined lying and sitting without masses, retractions, discharge or axillary adenopathy. Pelvic:  Ext, BUS, Vagina: With atrophic changes  Adnexa: Without masses or tenderness    Anus and perineum: Normal   Rectovaginal: Normal sphincter tone without palpated masses or tenderness.    Assessment/Plan:  72 y.o. G2P2 female for breast and pelvic exam.  1. Postmenopausal/atrophic genital changes.  Status post TAH LSO for endometriosis.  On estradiol 0.5 mg daily.  Had tried stopping previously with unacceptable hot flushes and sweats.  We again reviewed the whole issue of HRT risks versus benefits.  Thrombosis such as stroke heart attack DVT in the breast cancer issue reviewed.  At this point the patient wants to continue understanding and accepting the risks.  Refill times 1 year provided. 2. Osteopenia.  DEXA 01/2017 T score -1.5 stable from prior DEXA.  FRAX 13% / 2%.  Plan repeat DEXA in another year or 2. 3. Colonoscopy 2015.  Repeat at their recommended interval. 4. Pap smear 2015.   Pap smear done today.  Discussed current screening guidelines and options to stop screening based on age and hysterectomy history reviewed.  At this point the patient is uncomfortable stop screening. 5. Mammography 01/2017.  Continue with annual mammography when due.  Breast exam normal today. 6. Health maintenance.  No routine lab work done as patient does this elsewhere.  Follow-up 1 year, sooner as needed.   Anastasio Auerbach MD, 4:48 PM 04/15/2017

## 2017-04-15 NOTE — Patient Instructions (Signed)
Follow-up in 1 year for annual exam, sooner as needed. 

## 2017-04-17 LAB — PAP IG W/ RFLX HPV ASCU

## 2017-04-21 DIAGNOSIS — Z23 Encounter for immunization: Secondary | ICD-10-CM | POA: Diagnosis not present

## 2017-06-17 DIAGNOSIS — D2262 Melanocytic nevi of left upper limb, including shoulder: Secondary | ICD-10-CM | POA: Diagnosis not present

## 2017-06-17 DIAGNOSIS — I788 Other diseases of capillaries: Secondary | ICD-10-CM | POA: Diagnosis not present

## 2017-06-17 DIAGNOSIS — D2271 Melanocytic nevi of right lower limb, including hip: Secondary | ICD-10-CM | POA: Diagnosis not present

## 2017-06-17 DIAGNOSIS — D692 Other nonthrombocytopenic purpura: Secondary | ICD-10-CM | POA: Diagnosis not present

## 2017-06-17 DIAGNOSIS — L98499 Non-pressure chronic ulcer of skin of other sites with unspecified severity: Secondary | ICD-10-CM | POA: Diagnosis not present

## 2017-06-17 DIAGNOSIS — L400 Psoriasis vulgaris: Secondary | ICD-10-CM | POA: Diagnosis not present

## 2017-06-17 DIAGNOSIS — L821 Other seborrheic keratosis: Secondary | ICD-10-CM | POA: Diagnosis not present

## 2017-06-17 DIAGNOSIS — Z419 Encounter for procedure for purposes other than remedying health state, unspecified: Secondary | ICD-10-CM | POA: Diagnosis not present

## 2017-06-17 DIAGNOSIS — D225 Melanocytic nevi of trunk: Secondary | ICD-10-CM | POA: Diagnosis not present

## 2017-06-17 DIAGNOSIS — L309 Dermatitis, unspecified: Secondary | ICD-10-CM | POA: Diagnosis not present

## 2017-06-19 ENCOUNTER — Other Ambulatory Visit: Payer: Self-pay | Admitting: Internal Medicine

## 2017-06-19 NOTE — Telephone Encounter (Signed)
[place] Controlled Substance Database checked. Last filled on 03/19/17

## 2017-07-11 ENCOUNTER — Other Ambulatory Visit: Payer: Self-pay | Admitting: Internal Medicine

## 2017-08-06 DIAGNOSIS — H10501 Unspecified blepharoconjunctivitis, right eye: Secondary | ICD-10-CM | POA: Diagnosis not present

## 2017-10-23 ENCOUNTER — Other Ambulatory Visit: Payer: Self-pay | Admitting: Internal Medicine

## 2017-12-15 DIAGNOSIS — Z961 Presence of intraocular lens: Secondary | ICD-10-CM | POA: Diagnosis not present

## 2017-12-15 DIAGNOSIS — H43811 Vitreous degeneration, right eye: Secondary | ICD-10-CM | POA: Diagnosis not present

## 2017-12-15 DIAGNOSIS — H26492 Other secondary cataract, left eye: Secondary | ICD-10-CM | POA: Diagnosis not present

## 2017-12-15 DIAGNOSIS — H04123 Dry eye syndrome of bilateral lacrimal glands: Secondary | ICD-10-CM | POA: Diagnosis not present

## 2017-12-30 ENCOUNTER — Ambulatory Visit (INDEPENDENT_AMBULATORY_CARE_PROVIDER_SITE_OTHER): Payer: PPO | Admitting: *Deleted

## 2017-12-30 ENCOUNTER — Telehealth: Payer: Self-pay | Admitting: *Deleted

## 2017-12-30 VITALS — BP 126/78 | HR 64 | Resp 17 | Ht 62.0 in | Wt 102.0 lb

## 2017-12-30 DIAGNOSIS — Z0000 Encounter for general adult medical examination without abnormal findings: Secondary | ICD-10-CM | POA: Diagnosis not present

## 2017-12-30 MED ORDER — LORAZEPAM 0.5 MG PO TABS: 0.5000 mg | ORAL_TABLET | Freq: Two times a day (BID) | ORAL | 1 refills | Status: DC | PRN

## 2017-12-30 NOTE — Progress Notes (Addendum)
Subjective:   Renee Santos is a 72 y.o. female who presents for Medicare Annual (Subsequent) preventive examination.  Review of Systems:  No ROS.  Medicare Wellness Visit. Additional risk factors are reflected in the social history.  Cardiac Risk Factors include: advanced age (>11men, >79 women);dyslipidemia;hypertension Sleep patterns: gets up 1 times nightly to void and sleeps 6-7 hours nightly.    Home Safety/Smoke Alarms: Feels safe in home. Smoke alarms in place.  Living environment; residence and Firearm Safety: 2-story house, no firearms. Lives with husband, no needs for DME, good support system Seat Belt Safety/Bike Helmet: Wears seat belt.     Objective:     Vitals: BP 126/78   Pulse 64   Resp 17   Ht 5\' 2"  (1.575 m)   Wt 102 lb (46.3 kg)   SpO2 98%   BMI 18.66 kg/m   Body mass index is 18.66 kg/m.  Advanced Directives 12/30/2017 12/24/2016 07/25/2015 06/15/2014 12/16/2013  Does Patient Have a Medical Advance Directive? Yes Yes Yes Yes Yes  Type of Paramedic of [place];Living will [place];Living will - - [place];Living will  Copy of [place] in Chart? No - copy requested No - copy requested - Yes -    Tobacco Social History   Tobacco Use  Smoking Status Former Smoker  Smokeless Tobacco Never Used  Tobacco Comment   college only socially     Counseling given: Not Answered Comment: college only socially  Past Medical History:  Diagnosis Date  . Cancer [facility]) 2011   Basal Cell of nose; Dr Martinique  . Herpes zoster 2008   posterior thorax  . Hypertension   . Neurofibroma of back 2013    Dr Amy Martinique   . Osteopenia 2016   T score -1.6 FRAX not calculated because of HRT  . Psoriasis    Dr Amy Martinique  . Thyroid nodule    Past Surgical History:  Procedure Laterality Date  . ABDOMINAL HYSTERECTOMY      LSO  & appendectomy for endometrioma   @ age38 for ovarian cyst    . APPENDECTOMY    . cataract Bilateral Dec 2016;   not sure of date; wears readers only   . COLONOSCOPY  multiple  . OOPHORECTOMY     LSO for cyst  . TONSILLECTOMY     Family History  Problem Relation Age of Onset  . Hypertension Father   . Lung cancer Father        remote smoker, deceased 37  . Hyperlipidemia Mother        CABG in late 14s  . Hypothyroidism Mother   . Macular degeneration Mother   . Hypertension Mother   . Coronary artery disease Mother        bypass surgery  . Stroke Mother 57       Deceased  . Coronary artery disease Maternal Grandmother   . Asthma Maternal Uncle        2  . Asthma Maternal Grandfather   . Diabetes Neg Hx   . Heart attack Neg Hx   . Colon cancer Neg Hx    Social History   Socioeconomic History  . Marital status: Married    Spouse name: Not on file  . Number of children: 2  . Years of education: Not on file  . Highest education level: Not on file  Occupational History  . Not on file  Social Needs  .  Financial resource strain: Not hard at all  . Food insecurity:    Worry: Never true    Inability: Never true  . Transportation needs:    Medical: No    Non-medical: No  Tobacco Use  . Smoking status: Former Research scientist (life sciences)  . Smokeless tobacco: Never Used  . Tobacco comment: college only socially  Substance and Sexual Activity  . Alcohol use: Yes    Alcohol/week: 5.0 standard drinks    Types: 5 Glasses of  per week  . Drug use: No  . Sexual activity: Yes    Birth control/protection: Surgical    Comment: HYST-1st intercourse 72 yo-Fewer than 5 partners  Lifestyle  . Physical activity:    Days per week: 6 days    Minutes per session: 60 min  . Stress: Only a little  Relationships  . Social connections:    Talks on phone: More than three times a week    Gets together: More than three times a week    Attends religious service: More than 4 times per year    Active member of club or organization: Yes    Attends meetings of  clubs or organizations: More than 4 times per year    Relationship status: Married  Other Topics Concern  . Not on file  Social History Narrative   Lives with husband x 45 years in a two story home.  Has 2 children.     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     Education: college.    Outpatient Encounter Medications as of 12/30/2017  Medication Sig  . BIOTIN FORTE PO Take 1 tablet by mouth.  . Calcium Carbonate-Vit D-Min (CALTRATE PLUS PO) Take by mouth daily.    . clobetasol cream (TEMOVATE) 3.71 % Apply 1 application topically 2 (two) times daily.  Marland Kitchen estradiol (ESTRACE) 0.5 MG tablet Take 1 tablet (0.5 mg total) by mouth daily.  . hydrochlorothiazide (HYDRODIURIL) 25 MG tablet TAKE 1/2 BY MOUTH DAILY  . levothyroxine (SYNTHROID, LEVOTHROID) 75 MCG tablet Take 1 tablet by mouth daily  . LORazepam (ATIVAN) 0.5 MG tablet TAKE 1 TABLET BY MOUTH TWICE DAILY AS NEEDED FOR ANXIETY  . metoprolol succinate (TOPROL-XL) 50 MG 24 hr tablet 1/2 by mouth daily  . multivitamin-lutein (OCUVITE-LUTEIN) CAPS capsule Take 1 capsule by mouth daily.   No facility-administered encounter medications on file as of 12/30/2017.     Activities of Daily Living In your present state of health, do you have any difficulty performing the following activities: 12/30/2017  Hearing? N  Vision? N  Difficulty concentrating or making decisions? N  Walking or climbing stairs? N  Dressing or bathing? N  Doing errands, shopping? N  Preparing Food and eating ? N  Using the Toilet? N  In the past six months, have you accidently leaked urine? Y  Do you have problems with loss of bowel control? N  Managing your Medications? N  Managing your Finances? N  Housekeeping or managing your Housekeeping? N  Some recent data might be hidden    Patient Care Team: Binnie Rail, MD as PCP - General (Internal Medicine) Martinique, Amy, MD as Consulting Physician (Dermatology) Phineas Real Belinda Block, MD as Consulting Physician  (Gynecology)    Assessment:   This is a routine wellness examination for [facility]. Physical assessment deferred to PCP.   Exercise Activities and Dietary recommendations Current Exercise Habits: Home exercise routine, Type of exercise: walking, Time (Minutes): 50, Frequency (Times/Week): 6, Weekly Exercise (Minutes/Week): 300, Intensity: Mild, Exercise  limited by: None identified  Diet (meal preparation, eat out, water intake, caffeinated beverages, dairy products, fruits and vegetables): in general, a "healthy" diet  , well balanced. eats a variety of fruits and vegetables daily, limits salt, fat/cholesterol, sugar,carbohydrates,caffeine, drinks 6-8 glasses of water daily.  Goals      Patient Stated   . <enter goal here> (pt-stated)     Wants to continue to be healthy  Including walking; trips; add light weights;   Viniyoga; [email or web address] for stress reduction        Other   . patient     Goal is to travel again; plan a trip; Add weights; low and slow;     . Patient Stated     Continue to travel and go on pilgrimages. Enjoy life and love family.       Fall Risk Fall Risk  12/30/2017 12/24/2016 07/25/2015 06/15/2014 04/13/2013  Falls in the past year? 0 No No No No   Depression Screen PHQ 2/9 Scores 12/30/2017 12/24/2016 07/25/2015 06/15/2014  PHQ - 2 Score 0 0 0 0  PHQ- 9 Score 1 2 - -     Cognitive Function MMSE - Mini Mental State Exam 12/24/2016  Orientation to time 5  Orientation to Place 5  Registration 3  Attention/ Calculation 3  Recall 2  Language- name 2 objects 2  Language- repeat 1  Language- follow 3 step command 3  Language- read & follow direction 1  Write a sentence 1  Copy design 1  Total score 27       Ad8 score reviewed for issues:  Issues making decisions: no  Less interest in hobbies / activities: no  Repeats questions, stories (family complaining): no  Trouble using ordinary gadgets (microwave, computer, phone):no  Forgets the month or  year: no  Mismanaging finances: no  Remembering appts: no  Daily problems with thinking and/or memory: yes Ad8 score is= 1  Immunization History  Administered Date(s) Administered  . H1N1 03/01/2008  . Hepatitis A 07/05/1997, 04/19/2004  . Hepatitis B 05/30/2008, 07/05/2008, 03/27/2011  . Influenza Split 12/04/2010, 11/05/2011  . Influenza, High Dose Seasonal PF 12/27/2015, 10/08/2016  . Influenza,inj,Quad PF,6+ Mos 12/03/2012, 12/15/2013, 12/21/2014  . Influenza-Unspecified 11/11/2017  . Meningococcal Conjugate 07/05/2008  . Pneumococcal Conjugate-13 06/15/2014  . Pneumococcal Polysaccharide-23 04/13/2013  . Tdap 04/21/2017  . Tetanus 02/21/2014  . Typhoid Inactivated 04/19/2004, 04/21/2017  . Yellow Fever 05/30/2008  . Zoster 02/18/2014   Screening Tests Health Maintenance  Topic Date Due  . MAMMOGRAM  01/31/2019  . COLONOSCOPY  12/31/2023  . TETANUS/TDAP  04/22/2027  . INFLUENZA VACCINE  Completed  . DEXA SCAN  Completed  . Hepatitis C Screening  Completed  . PNA vac Low Risk Adult  Completed      Plan:      Continue doing brain stimulating activities (puzzles, reading, adult coloring books, staying active) to keep memory sharp.   Continue to eat heart healthy diet (full of fruits, vegetables, whole grains, lean protein, water--limit salt, fat, and sugar intake) and increase physical activity as tolerated.  I have personally reviewed and noted the following in the patient's chart:   . Medical and social history . Use of alcohol, tobacco or illicit drugs  . Current medications and supplements . Functional ability and status . Nutritional status . Physical activity . Advanced directives . List of other physicians . Vitals . Screenings to include cognitive, depression, and falls . Referrals and appointments  In addition, I have reviewed  and discussed with patient certain preventive protocols, quality metrics, and best practice recommendations. A written  personalized care plan for preventive services as well as general preventive health recommendations were provided to patient.     Michiel Cowboy, RN  12/30/2017    Medical screening examination/treatment/procedure(s) were performed by non-physician practitioner and as supervising physician I was immediately available for consultation/collaboration. I agree with above. Binnie Rail, MD

## 2017-12-30 NOTE — Telephone Encounter (Signed)
During AWV, patient asked if she could get a prescription refill for Lorazepam. Stating that the holidays are going to be very busy for her this season.

## 2017-12-30 NOTE — Telephone Encounter (Signed)
[place] controlled substance database checked.  Ok to fill medication. Last filled 09/09/17.   rx sent to [facility]

## 2017-12-30 NOTE — Patient Instructions (Addendum)
Continue doing brain stimulating activities (puzzles, reading, adult coloring books, staying active) to keep memory sharp.   Continue to eat heart healthy diet (full of fruits, vegetables, whole grains, lean protein, water--limit salt, fat, and sugar intake) and increase physical activity as tolerated.   Renee Santos , Thank you for taking time to come for your Medicare Wellness Visit. I appreciate your ongoing commitment to your health goals. Please review the following plan we discussed and let me know if I can assist you in the future.   These are the goals we discussed: Goals      Patient Stated   . <enter goal here> (pt-stated)     Wants to continue to be healthy  Including walking; trips; add light weights;   Viniyoga; [email or web address] for stress reduction        Other   . patient     Goal is to travel again; plan a trip; Add weights; low and slow;     . Patient Stated     Continue to travel and go on pilgrimages. Enjoy life and love family.       This is a list of the screening recommended for you and due dates:  Health Maintenance  Topic Date Due  . Mammogram  01/31/2019  . Colon Cancer Screening  12/31/2023  . Tetanus Vaccine  04/22/2027  . Flu Shot  Completed  . DEXA scan (bone density measurement)  Completed  .  Hepatitis C: One time screening is recommended by Center for Disease Control  (CDC) for  adults born from [birth year] through 1965.   Completed  . Pneumonia vaccines  Completed   Health Maintenance, Female Adopting a healthy lifestyle and getting preventive care can go a long way to promote health and wellness. Talk with your health care provider about what schedule of regular examinations is right for you. This is a good chance for you to check in with your provider about disease prevention and staying healthy. In between checkups, there are plenty of things you can do on your own. Experts have done a lot of research about which lifestyle changes and preventive  measures are most likely to keep you healthy. Ask your health care provider for more information. Weight and diet Eat a healthy diet  Be sure to include plenty of vegetables, fruits, low-fat dairy products, and lean protein.  Do not eat a lot of foods high in solid fats, added sugars, or salt.  Get regular exercise. This is one of the most important things you can do for your health. ? Most adults should exercise for at least 150 minutes each week. The exercise should increase your heart rate and make you sweat (moderate-intensity exercise). ? Most adults should also do strengthening exercises at least twice a week. This is in addition to the moderate-intensity exercise.  Maintain a healthy weight  Body mass index (BMI) is a measurement that can be used to identify possible weight problems. It estimates body fat based on height and weight. Your health care provider can help determine your BMI and help you achieve or maintain a healthy weight.  For females 79 years of age and older: ? A BMI below 18.5 is considered underweight. ? A BMI of 18.5 to 24.9 is normal. ? A BMI of 25 to 29.9 is considered overweight. ? A BMI of 30 and above is considered obese.  Watch levels of cholesterol and blood lipids  You should start having your blood tested for lipids and cholesterol  at 70 years of age, then have this test every 5 years.  You may need to have your cholesterol levels checked more often if: ? Your lipid or cholesterol levels are high. ? You are older than 70 years of age. ? You are at high risk for heart disease.  Cancer screening Lung Cancer  Lung cancer screening is recommended for adults 69-85 years old who are at high risk for lung cancer because of a history of smoking.  A yearly low-dose CT scan of the lungs is recommended for people who: ? Currently smoke. ? Have quit within the past 15 years. ? Have at least a 30-pack-year history of smoking. A pack year is smoking an  average of one pack of cigarettes a day for 1 year.  Yearly screening should continue until it has been 15 years since you quit.  Yearly screening should stop if you develop a health problem that would prevent you from having lung cancer treatment.  Breast Cancer  Practice breast self-awareness. This means understanding how your breasts normally appear and feel.  It also means doing regular breast self-exams. Let your health care provider know about any changes, no matter how small.  If you are in your 20s or 30s, you should have a clinical breast exam (CBE) by a health care provider every 1-3 years as part of a regular health exam.  If you are 8 or older, have a CBE every year. Also consider having a breast X-ray (mammogram) every year.  If you have a family history of breast cancer, talk to your health care provider about genetic screening.  If you are at high risk for breast cancer, talk to your health care provider about having an MRI and a mammogram every year.  Breast cancer gene (BRCA) assessment is recommended for women who have family members with BRCA-related cancers. BRCA-related cancers include: ? Breast. ? Ovarian. ? Tubal. ? Peritoneal cancers.  Results of the assessment will determine the need for genetic counseling and BRCA1 and BRCA2 testing.  Cervical Cancer Your health care provider may recommend that you be screened regularly for cancer of the pelvic organs (ovaries, uterus, and vagina). This screening involves a pelvic examination, including checking for microscopic changes to the surface of your cervix (Pap test). You may be encouraged to have this screening done every 3 years, beginning at age 38.  For women ages 57-65, health care providers may recommend pelvic exams and Pap testing every 3 years, or they may recommend the Pap and pelvic exam, combined with testing for human papilloma virus (HPV), every 5 years. Some types of HPV increase your risk of cervical  cancer. Testing for HPV may also be done on women of any age with unclear Pap test results.  Other health care providers may not recommend any screening for nonpregnant women who are considered low risk for pelvic cancer and who do not have symptoms. Ask your health care provider if a screening pelvic exam is right for you.  If you have had past treatment for cervical cancer or a condition that could lead to cancer, you need Pap tests and screening for cancer for at least 20 years after your treatment. If Pap tests have been discontinued, your risk factors (such as having a new sexual partner) need to be reassessed to determine if screening should resume. Some women have medical problems that increase the chance of getting cervical cancer. In these cases, your health care provider may recommend more frequent screening and  Pap tests.  Colorectal Cancer  This type of cancer can be detected and often prevented.  Routine colorectal cancer screening usually begins at 70 years of age and continues through 70 years of age.  Your health care provider may recommend screening at an earlier age if you have risk factors for colon cancer.  Your health care provider may also recommend using home test kits to check for hidden blood in the stool.  A small camera at the end of a tube can be used to examine your colon directly (sigmoidoscopy or colonoscopy). This is done to check for the earliest forms of colorectal cancer.  Routine screening usually begins at age 28.  Direct examination of the colon should be repeated every 5-10 years through 70 years of age. However, you may need to be screened more often if early forms of precancerous polyps or small growths are found.  Skin Cancer  Check your skin from head to toe regularly.  Tell your health care provider about any new moles or changes in moles, especially if there is a change in a mole's shape or color.  Also tell your health care provider if you  have a mole that is larger than the size of a pencil eraser.  Always use sunscreen. Apply sunscreen liberally and repeatedly throughout the day.  Protect yourself by wearing long sleeves, pants, a wide-brimmed hat, and sunglasses whenever you are outside.  Heart disease, diabetes, and high blood pressure  High blood pressure causes heart disease and increases the risk of stroke. High blood pressure is more likely to develop in: ? People who have blood pressure in the high end of the normal range (130-139/85-89 mm Hg). ? People who are overweight or obese. ? People who are African American.  If you are 66-68 years of age, have your blood pressure checked every 3-5 years. If you are 35 years of age or older, have your blood pressure checked every year. You should have your blood pressure measured twice-once when you are at a hospital or clinic, and once when you are not at a hospital or clinic. Record the average of the two measurements. To check your blood pressure when you are not at a hospital or clinic, you can use: ? An automated blood pressure machine at a pharmacy. ? A home blood pressure monitor.  If you are between 56 years and 1 years old, ask your health care provider if you should take aspirin to prevent strokes.  Have regular diabetes screenings. This involves taking a blood sample to check your fasting blood sugar level. ? If you are at a normal weight and have a low risk for diabetes, have this test once every three years after 70 years of age. ? If you are overweight and have a high risk for diabetes, consider being tested at a younger age or more often. Preventing infection Hepatitis B  If you have a higher risk for hepatitis B, you should be screened for this virus. You are considered at high risk for hepatitis B if: ? You were born in a country where hepatitis B is common. Ask your health care provider which countries are considered high risk. ? Your parents were born in  a high-risk country, and you have not been immunized against hepatitis B (hepatitis B vaccine). ? You have HIV or AIDS. ? You use needles to inject street drugs. ? You live with someone who has hepatitis B. ? You have had sex with someone who has  hepatitis B. ? You get hemodialysis treatment. ? You take certain medicines for conditions, including cancer, organ transplantation, and autoimmune conditions.  Hepatitis C  Blood testing is recommended for: ? Everyone born from [birth year] through 1965. ? Anyone with known risk factors for hepatitis C.  Sexually transmitted infections (STIs)  You should be screened for sexually transmitted infections (STIs) including gonorrhea and chlamydia if: ? You are sexually active and are younger than 70 years of age. ? You are older than 70 years of age and your health care provider tells you that you are at risk for this type of infection. ? Your sexual activity has changed since you were last screened and you are at an increased risk for chlamydia or gonorrhea. Ask your health care provider if you are at risk.  If you do not have HIV, but are at risk, it may be recommended that you take a prescription medicine daily to prevent HIV infection. This is called pre-exposure prophylaxis (PrEP). You are considered at risk if: ? You are sexually active and do not regularly use condoms or know the HIV status of your partner(s). ? You take drugs by injection. ? You are sexually active with a partner who has HIV.  Talk with your health care provider about whether you are at high risk of being infected with HIV. If you choose to begin PrEP, you should first be tested for HIV. You should then be tested every 3 months for as long as you are taking PrEP. Pregnancy  If you are premenopausal and you may become pregnant, ask your health care provider about preconception counseling.  If you may become pregnant, take 400 to 800 micrograms (mcg) of folic acid every day.  If  you want to prevent pregnancy, talk to your health care provider about birth control (contraception). Osteoporosis and menopause  Osteoporosis is a disease in which the bones lose minerals and strength with aging. This can result in serious bone fractures. Your risk for osteoporosis can be identified using a bone density scan.  If you are 67 years of age or older, or if you are at risk for osteoporosis and fractures, ask your health care provider if you should be screened.  Ask your health care provider whether you should take a calcium or vitamin D supplement to lower your risk for osteoporosis.  Menopause may have certain physical symptoms and risks.  Hormone replacement therapy may reduce some of these symptoms and risks. Talk to your health care provider about whether hormone replacement therapy is right for you. Follow these instructions at home:  Schedule regular health, dental, and eye exams.  Stay current with your immunizations.  Do not use any tobacco products including cigarettes, chewing tobacco, or electronic cigarettes.  If you are pregnant, do not drink alcohol.  If you are breastfeeding, limit how much and how often you drink alcohol.  Limit alcohol intake to no more than 1 drink per day for nonpregnant women. One drink equals 12 ounces of beer, 5 ounces of wine, or 1 ounces of hard liquor.  Do not use street drugs.  Do not share needles.  Ask your health care provider for help if you need support or information about quitting drugs.  Tell your health care provider if you often feel depressed.  Tell your health care provider if you have ever been abused or do not feel safe at home. This information is not intended to replace advice given to you by your health care provider.  Make sure you discuss any questions you have with your health care provider. Document Released: 08/13/2010 Document Revised: 07/06/2015 Document Reviewed: 11/01/2014 Elsevier Interactive  Patient Education  [employer].

## 2018-01-26 NOTE — Progress Notes (Signed)
Subjective:    Patient ID: Renee Santos, female    DOB: 1945-07-19, 72 y.o.   MRN: [id number]  HPI She is here for a physical exam.   She denies any changes in her health since she was here last.  She has no concerns.  Overall she feels well.  She exercises regularly and is very active.  Medications and allergies reviewed with patient and updated if appropriate.  Patient Active Problem List   Diagnosis Date Noted  . Paronychia of great toe, left 10/08/2016  . Hyperglycemia 07/16/2016  . Dizziness 07/16/2016  . Lightheadedness 07/16/2016  . Calcific tendonitis of right hand 05/01/2015  . Herpes simplex 05/01/2015  . Situational anxiety 05/01/2015  . Essential hypertension 03/01/2008  . Osteopenia 03/01/2008  . THYROID NODULE 01/13/2007  . Hypothyroidism 01/13/2007  . HYPERLIPIDEMIA 01/13/2007  . PSORIASIS 01/13/2007    Current Outpatient Medications on File Prior to Visit  Medication Sig Dispense Refill  . BIOTIN FORTE PO Take 1 tablet by mouth.    . Calcium Carbonate-Vit D-Min (CALTRATE PLUS PO) Take by mouth daily.      . clobetasol cream (TEMOVATE) 7.37 % Apply 1 application topically 2 (two) times daily. 45 g 1  . estradiol (ESTRACE) 0.5 MG tablet Take 1 tablet (0.5 mg total) by mouth daily. 90 tablet 4  . hydrochlorothiazide (HYDRODIURIL) 25 MG tablet TAKE 1/2 BY MOUTH DAILY 45 tablet 3  . levothyroxine (SYNTHROID, LEVOTHROID) 75 MCG tablet Take 1 tablet by mouth daily 90 tablet 0  . LORazepam (ATIVAN) 0.5 MG tablet Take 1 tablet (0.5 mg total) by mouth 2 (two) times daily as needed. for anxiety 15 tablet 1  . metoprolol succinate (TOPROL-XL) 50 MG 24 hr tablet 1/2 by mouth daily 45 tablet 3  . multivitamin-lutein (OCUVITE-LUTEIN) CAPS capsule Take 1 capsule by mouth daily.     No current facility-administered medications on file prior to visit.     Past Medical History:  Diagnosis Date  . Cancer [facility]) 2011   Basal Cell of nose; Dr Martinique  . Herpes zoster  2008   posterior thorax  . Hypertension   . Neurofibroma of back 2013    Dr Amy Martinique   . Osteopenia 2016   T score -1.6 FRAX not calculated because of HRT  . Psoriasis    Dr Amy Martinique  . Thyroid nodule     Past Surgical History:  Procedure Laterality Date  . ABDOMINAL HYSTERECTOMY      LSO  & appendectomy for endometrioma   @ age38 for ovarian cyst  . APPENDECTOMY    . cataract Bilateral Dec 2016;   not sure of date; wears readers only   . COLONOSCOPY  multiple  . OOPHORECTOMY     LSO for cyst  . TONSILLECTOMY      Social History   Socioeconomic History  . Marital status: Married    Spouse name: Not on file  . Number of children: 2  . Years of education: Not on file  . Highest education level: Not on file  Occupational History  . Not on file  Social Needs  . Financial resource strain: Not hard at all  . Food insecurity:    Worry: Never true    Inability: Never true  . Transportation needs:    Medical: No    Non-medical: No  Tobacco Use  . Smoking status: Former Research scientist (life sciences)  . Smokeless tobacco: Never Used  . Tobacco comment: college only socially  Substance  and Sexual Activity  . Alcohol use: Yes    Alcohol/week: 5.0 standard drinks    Types: 5 Glasses of wine per week  . Drug use: No  . Sexual activity: Yes    Birth control/protection: Surgical    Comment: HYST-1st intercourse 72 yo-Fewer than 5 partners  Lifestyle  . Physical activity:    Days per week: 6 days    Minutes per session: 60 min  . Stress: Only a little  Relationships  . Social connections:    Talks on phone: More than three times a week    Gets together: More than three times a week    Attends religious service: More than 4 times per year    Active member of club or organization: Yes    Attends meetings of clubs or organizations: More than 4 times per year    Relationship status: Married  Other Topics Concern  . Not on file  Social History Narrative   Lives with husband x 45 years in  a two story home.  Has 2 children.     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     Education: college.    Family History  Problem Relation Age of Onset  . Hypertension Father   . Lung cancer Father        remote smoker, deceased 57  . Hyperlipidemia Mother        CABG in late 52s  . Hypothyroidism Mother   . Macular degeneration Mother   . Hypertension Mother   . Coronary artery disease Mother        bypass surgery  . Stroke Mother 11       Deceased  . Coronary artery disease Maternal Grandmother   . Asthma Maternal Uncle        2  . Asthma Maternal Grandfather   . Diabetes Neg Hx   . Heart attack Neg Hx   . Colon cancer Neg Hx     Review of Systems  Constitutional: Negative for chills, fatigue and fever.  Eyes: Negative for visual disturbance.  Respiratory: Negative for cough, shortness of breath and wheezing.   Cardiovascular: Negative for chest pain, palpitations and leg swelling.  Gastrointestinal: Negative for abdominal pain, blood in stool, constipation, diarrhea and nausea.       No gerd  Genitourinary: Negative for dysuria and hematuria.  Skin: Positive for rash (psoriasis - controlled). Negative for color change.  Neurological: Positive for dizziness (occ - changes in position). Negative for light-headedness and headaches.  Psychiatric/Behavioral: Positive for sleep disturbance (gets about 5 1/2 hrs - not tired). Negative for dysphoric mood. The patient is not nervous/anxious.        Objective:   Vitals:   01/27/18 0816  BP: 122/78  Pulse: 61  Resp: 16  Temp: 98.4 F (36.9 C)  SpO2: 98%   Filed Weights   01/27/18 0816  Weight: 99 lb 6.4 oz (45.1 kg)   Body mass index is 18.18 kg/m.  BP Readings from Last 3 Encounters:  01/27/18 122/78  12/30/17 126/78  04/15/17 122/78    Wt Readings from Last 3 Encounters:  01/27/18 99 lb 6.4 oz (45.1 kg)  12/30/17 102 lb (46.3 kg)  04/15/17 102 lb (46.3 kg)     Physical Exam Constitutional: She appears  well-developed and well-nourished. No distress.  HENT:  Head: Normocephalic and atraumatic.  Right Ear: External ear normal. Normal ear canal and TM Left Ear: External ear normal.  Normal ear canal and TM  Mouth/Throat: Oropharynx is clear and moist.  Eyes: Conjunctivae and EOM are normal.  Neck: Neck supple. No tracheal deviation present. No thyromegaly present.  No carotid bruit  Cardiovascular: Normal rate, regular rhythm and normal heart sounds.   No murmur heard.  No edema. Pulmonary/Chest: Effort normal and breath sounds normal. No respiratory distress. She has no wheezes. She has no rales.  Breast: deferred to Gyn Abdominal: Soft. She exhibits no distension. There is no tenderness.  Lymphadenopathy: She has no cervical adenopathy.  Skin: Skin is warm and dry. She is not diaphoretic.  Psychiatric: She has a normal mood and affect. Her behavior is normal.        Assessment & Plan:   Physical exam: Screening blood work    ordered Immunizations   discussed shingles vaccine, others up-to-date Colonoscopy    up-to-date Mammogram    up-to-date Gyn   Up to date  Dexa 01/2017-osteopenia, up-to-date Eye exams   Up to date  EKG   done 2015, exercise tolerance test 2016 Exercise  Walking 5 days a week - 3/5 miles each time Weight    normal BMI Skin  No concerns - sees derma annually Substance abuse   none  See Problem List for Assessment and Plan of chronic medical problems.   Follow-up annually

## 2018-01-26 NOTE — Patient Instructions (Addendum)
Tests ordered today. Your results will be released to [facility] (or called to you) after review, usually within 72hours after test completion. If any changes need to be made, you will be notified at that same time.  All other Health Maintenance issues reviewed.   All recommended immunizations and age-appropriate screenings are up-to-date or discussed.  No immunizations administered today.   Medications reviewed and updated.  Changes include :   none  Your prescription(s) have been submitted to your pharmacy. Please take as directed and contact our office if you believe you are having problem(s) with the medication(s).   Please followup in 1 year for physical   Health Maintenance, Female Adopting a healthy lifestyle and getting preventive care can go a long way to promote health and wellness. Talk with your health care provider about what schedule of regular examinations is right for you. This is a good chance for you to check in with your provider about disease prevention and staying healthy. In between checkups, there are plenty of things you can do on your own. Experts have done a lot of research about which lifestyle changes and preventive measures are most likely to keep you healthy. Ask your health care provider for more information. Weight and diet Eat a healthy diet  Be sure to include plenty of vegetables, fruits, low-fat dairy products, and lean protein.  Do not eat a lot of foods high in solid fats, added sugars, or salt.  Get regular exercise. This is one of the most important things you can do for your health. ? Most adults should exercise for at least 150 minutes each week. The exercise should increase your heart rate and make you sweat (moderate-intensity exercise). ? Most adults should also do strengthening exercises at least twice a week. This is in addition to the moderate-intensity exercise.  Maintain a healthy weight  Body mass index (BMI) is a measurement that can be  used to identify possible weight problems. It estimates body fat based on height and weight. Your health care provider can help determine your BMI and help you achieve or maintain a healthy weight.  For females 44 years of age and older: ? A BMI below 18.5 is considered underweight. ? A BMI of 18.5 to 24.9 is normal. ? A BMI of 25 to 29.9 is considered overweight. ? A BMI of 30 and above is considered obese.  Watch levels of cholesterol and blood lipids  You should start having your blood tested for lipids and cholesterol at 70 years of age, then have this test every 5 years.  You may need to have your cholesterol levels checked more often if: ? Your lipid or cholesterol levels are high. ? You are older than 70 years of age. ? You are at high risk for heart disease.  Cancer screening Lung Cancer  Lung cancer screening is recommended for adults 43-19 years old who are at high risk for lung cancer because of a history of smoking.  A yearly low-dose CT scan of the lungs is recommended for people who: ? Currently smoke. ? Have quit within the past 15 years. ? Have at least a 30-pack-year history of smoking. A pack year is smoking an average of one pack of cigarettes a day for 1 year.  Yearly screening should continue until it has been 15 years since you quit.  Yearly screening should stop if you develop a health problem that would prevent you from having lung cancer treatment.  Breast Cancer  Practice  breast self-awareness. This means understanding how your breasts normally appear and feel.  It also means doing regular breast self-exams. Let your health care provider know about any changes, no matter how small.  If you are in your 20s or 30s, you should have a clinical breast exam (CBE) by a health care provider every 1-3 years as part of a regular health exam.  If you are 63 or older, have a CBE every year. Also consider having a breast X-ray (mammogram) every year.  If you have  a family history of breast cancer, talk to your health care provider about genetic screening.  If you are at high risk for breast cancer, talk to your health care provider about having an MRI and a mammogram every year.  Breast cancer gene (BRCA) assessment is recommended for women who have family members with BRCA-related cancers. BRCA-related cancers include: ? Breast. ? Ovarian. ? Tubal. ? Peritoneal cancers.  Results of the assessment will determine the need for genetic counseling and BRCA1 and BRCA2 testing.  Cervical Cancer Your health care provider may recommend that you be screened regularly for cancer of the pelvic organs (ovaries, uterus, and vagina). This screening involves a pelvic examination, including checking for microscopic changes to the surface of your cervix (Pap test). You may be encouraged to have this screening done every 3 years, beginning at age 60.  For women ages 49-65, health care providers may recommend pelvic exams and Pap testing every 3 years, or they may recommend the Pap and pelvic exam, combined with testing for human papilloma virus (HPV), every 5 years. Some types of HPV increase your risk of cervical cancer. Testing for HPV may also be done on women of any age with unclear Pap test results.  Other health care providers may not recommend any screening for nonpregnant women who are considered low risk for pelvic cancer and who do not have symptoms. Ask your health care provider if a screening pelvic exam is right for you.  If you have had past treatment for cervical cancer or a condition that could lead to cancer, you need Pap tests and screening for cancer for at least 20 years after your treatment. If Pap tests have been discontinued, your risk factors (such as having a new sexual partner) need to be reassessed to determine if screening should resume. Some women have medical problems that increase the chance of getting cervical cancer. In these cases, your  health care provider may recommend more frequent screening and Pap tests.  Colorectal Cancer  This type of cancer can be detected and often prevented.  Routine colorectal cancer screening usually begins at 70 years of age and continues through 70 years of age.  Your health care provider may recommend screening at an earlier age if you have risk factors for colon cancer.  Your health care provider may also recommend using home test kits to check for hidden blood in the stool.  A small camera at the end of a tube can be used to examine your colon directly (sigmoidoscopy or colonoscopy). This is done to check for the earliest forms of colorectal cancer.  Routine screening usually begins at age 9.  Direct examination of the colon should be repeated every 5-10 years through 70 years of age. However, you may need to be screened more often if early forms of precancerous polyps or small growths are found.  Skin Cancer  Check your skin from head to toe regularly.  Tell your health care provider  new moles or changes in moles, especially if there is a change in a mole's shape or color.  Also tell your health care provider if you have a mole that is larger than the size of a pencil eraser.  Always use sunscreen. Apply sunscreen liberally and repeatedly throughout the day.  Protect yourself by wearing long sleeves, pants, a wide-brimmed hat, and sunglasses whenever you are outside.  Heart disease, diabetes, and high blood pressure  High blood pressure causes heart disease and increases the risk of stroke. High blood pressure is more likely to develop in: ? People who have blood pressure in the high end of the normal range (130-139/85-89 mm Hg). ? People who are overweight or obese. ? People who are African American.  If you are 18-39 years of age, have your blood pressure checked every 3-5 years. If you are 40 years of age or older, have your blood pressure checked every year. You  should have your blood pressure measured twice-once when you are at a hospital or clinic, and once when you are not at a hospital or clinic. Record the average of the two measurements. To check your blood pressure when you are not at a hospital or clinic, you can use: ? An automated blood pressure machine at a pharmacy. ? A home blood pressure monitor.  If you are between 55 years and 79 years old, ask your health care provider if you should take aspirin to prevent strokes.  Have regular diabetes screenings. This involves taking a blood sample to check your fasting blood sugar level. ? If you are at a normal weight and have a low risk for diabetes, have this test once every three years after 70 years of age. ? If you are overweight and have a high risk for diabetes, consider being tested at a younger age or more often. Preventing infection Hepatitis B  If you have a higher risk for hepatitis B, you should be screened for this virus. You are considered at high risk for hepatitis B if: ? You were born in a country where hepatitis B is common. Ask your health care provider which countries are considered high risk. ? Your parents were born in a high-risk country, and you have not been immunized against hepatitis B (hepatitis B vaccine). ? You have HIV or AIDS. ? You use needles to inject street drugs. ? You live with someone who has hepatitis B. ? You have had sex with someone who has hepatitis B. ? You get hemodialysis treatment. ? You take certain medicines for conditions, including cancer, organ transplantation, and autoimmune conditions.  Hepatitis C  Blood testing is recommended for: ? Everyone born from 1945 through 1965. ? Anyone with known risk factors for hepatitis C.  Sexually transmitted infections (STIs)  You should be screened for sexually transmitted infections (STIs) including gonorrhea and chlamydia if: ? You are sexually active and are younger than 70 years of age. ? You  are older than 70 years of age and your health care provider tells you that you are at risk for this type of infection. ? Your sexual activity has changed since you were last screened and you are at an increased risk for chlamydia or gonorrhea. Ask your health care provider if you are at risk.  If you do not have HIV, but are at risk, it may be recommended that you take a prescription medicine daily to prevent HIV infection. This is called pre-exposure prophylaxis (PrEP). You are considered at   risk if: ? You are sexually active and do not regularly use condoms or know the HIV status of your partner(s). ? You take drugs by injection. ? You are sexually active with a partner who has HIV.  Talk with your health care provider about whether you are at high risk of being infected with HIV. If you choose to begin PrEP, you should first be tested for HIV. You should then be tested every 3 months for as long as you are taking PrEP. Pregnancy  If you are premenopausal and you may become pregnant, ask your health care provider about preconception counseling.  If you may become pregnant, take 400 to 800 micrograms (mcg) of folic acid every day.  If you want to prevent pregnancy, talk to your health care provider about birth control (contraception). Osteoporosis and menopause  Osteoporosis is a disease in which the bones lose minerals and strength with aging. This can result in serious bone fractures. Your risk for osteoporosis can be identified using a bone density scan.  If you are 65 years of age or older, or if you are at risk for osteoporosis and fractures, ask your health care provider if you should be screened.  Ask your health care provider whether you should take a calcium or vitamin D supplement to lower your risk for osteoporosis.  Menopause may have certain physical symptoms and risks.  Hormone replacement therapy may reduce some of these symptoms and risks. Talk to your health care  provider about whether hormone replacement therapy is right for you. Follow these instructions at home:  Schedule regular health, dental, and eye exams.  Stay current with your immunizations.  Do not use any tobacco products including cigarettes, chewing tobacco, or electronic cigarettes.  If you are pregnant, do not drink alcohol.  If you are breastfeeding, limit how much and how often you drink alcohol.  Limit alcohol intake to no more than 1 drink per day for nonpregnant women. One drink equals 12 ounces of beer, 5 ounces of wine, or 1 ounces of hard liquor.  Do not use street drugs.  Do not share needles.  Ask your health care provider for help if you need support or information about quitting drugs.  Tell your health care provider if you often feel depressed.  Tell your health care provider if you have ever been abused or do not feel safe at home. This information is not intended to replace advice given to you by your health care provider. Make sure you discuss any questions you have with your health care provider. Document Released: 08/13/2010 Document Revised: 07/06/2015 Document Reviewed: 11/01/2014 Elsevier Interactive Patient Education  2018 [employer] Inc[employer]  

## 2018-01-26 NOTE — Assessment & Plan Note (Addendum)
DEXA up-to-date-2018-repeat in 3 years Osteopenia in right femoral neck Continue regular exercise-does a lot of walking and hiking Taking a multivitamin, calcium and vitamin D-continue

## 2018-01-27 ENCOUNTER — Ambulatory Visit (INDEPENDENT_AMBULATORY_CARE_PROVIDER_SITE_OTHER): Payer: PPO | Admitting: Internal Medicine

## 2018-01-27 ENCOUNTER — Encounter: Payer: Self-pay | Admitting: Internal Medicine

## 2018-01-27 ENCOUNTER — Other Ambulatory Visit (INDEPENDENT_AMBULATORY_CARE_PROVIDER_SITE_OTHER): Payer: PPO

## 2018-01-27 VITALS — BP 122/78 | HR 61 | Temp 98.4°F | Resp 16 | Ht 62.0 in | Wt 99.4 lb

## 2018-01-27 DIAGNOSIS — I10 Essential (primary) hypertension: Secondary | ICD-10-CM

## 2018-01-27 DIAGNOSIS — E038 Other specified hypothyroidism: Secondary | ICD-10-CM

## 2018-01-27 DIAGNOSIS — E782 Mixed hyperlipidemia: Secondary | ICD-10-CM

## 2018-01-27 DIAGNOSIS — L408 Other psoriasis: Secondary | ICD-10-CM

## 2018-01-27 DIAGNOSIS — R739 Hyperglycemia, unspecified: Secondary | ICD-10-CM | POA: Diagnosis not present

## 2018-01-27 DIAGNOSIS — F418 Other specified anxiety disorders: Secondary | ICD-10-CM | POA: Diagnosis not present

## 2018-01-27 DIAGNOSIS — Z0000 Encounter for general adult medical examination without abnormal findings: Secondary | ICD-10-CM

## 2018-01-27 DIAGNOSIS — M8580 Other specified disorders of bone density and structure, unspecified site: Secondary | ICD-10-CM | POA: Diagnosis not present

## 2018-01-27 LAB — LIPID PANEL
CHOL/HDL RATIO: 2
Cholesterol: 200 mg/dL (ref 0–200)
HDL: 97 mg/dL (ref >39.00)
LDL Cholesterol: 82 mg/dL (ref 0–99)
NONHDL: 103.06
Triglycerides: 103 mg/dL (ref 0.0–149.0)
VLDL: 20.6 mg/dL (ref 0.0–40.0)

## 2018-01-27 LAB — CBC WITH DIFFERENTIAL/PLATELET
BASOS ABS: 0 10*3/uL (ref 0.0–0.1)
Basophils Relative: 0.8 % (ref 0.0–3.0)
EOS ABS: 0 10*3/uL (ref 0.0–0.7)
Eosinophils Relative: 0.4 % (ref 0.0–5.0)
HCT: 41.2 % (ref 36.0–46.0)
Hemoglobin: 13.8 g/dL (ref 12.0–15.0)
LYMPHS PCT: 26.6 % (ref 12.0–46.0)
Lymphs Abs: 1.3 10*3/uL (ref 0.7–4.0)
MCHC: 33.6 g/dL (ref 30.0–36.0)
MCV: 96.2 fl (ref 78.0–100.0)
Monocytes Absolute: 0.4 10*3/uL (ref 0.1–1.0)
Monocytes Relative: 8.6 % (ref 3.0–12.0)
Neutro Abs: 3 10*3/uL (ref 1.4–7.7)
Neutrophils Relative %: 63.6 % (ref 43.0–77.0)
PLATELETS: 224 10*3/uL (ref 150.0–400.0)
RBC: 4.28 Mil/uL (ref 3.87–5.11)
RDW: 12.1 % (ref 11.5–15.5)
WBC: 4.8 10*3/uL (ref 4.0–10.5)

## 2018-01-27 LAB — COMPREHENSIVE METABOLIC PANEL
ALT: 14 U/L (ref 0–35)
AST: 18 U/L (ref 0–37)
Albumin: 4.1 g/dL (ref 3.5–5.2)
Alkaline Phosphatase: 37 U/L — ABNORMAL LOW (ref 39–117)
BUN: 19 mg/dL (ref 6–23)
CO2: 27 meq/L (ref 19–32)
Calcium: 9.6 mg/dL (ref 8.4–10.5)
Chloride: 103 mEq/L (ref 96–112)
Creatinine, Ser: 1.18 mg/dL (ref 0.40–1.20)
GFR: 48.03 mL/min — ABNORMAL LOW (ref >60.00)
Glucose, Bld: 99 mg/dL (ref 70–99)
Potassium: 4.1 mEq/L (ref 3.5–5.1)
Sodium: 139 mEq/L (ref 135–145)
Total Bilirubin: 0.7 mg/dL (ref 0.2–1.2)
Total Protein: 7 g/dL (ref 6.0–8.3)

## 2018-01-27 LAB — TSH: TSH: 1.15 u[IU]/mL (ref 0.35–4.50)

## 2018-01-27 LAB — HEMOGLOBIN A1C: Hgb A1c MFr Bld: 5.4 % (ref 4.6–6.5)

## 2018-01-27 NOTE — Assessment & Plan Note (Signed)
Mild hyperglycemia in the past Check A1c 

## 2018-01-27 NOTE — Assessment & Plan Note (Signed)
Check lipid panel, CMP Diet controlled Continue healthy diet and regular exercise

## 2018-01-27 NOTE — Assessment & Plan Note (Signed)
BP well controlled Current regimen effective and well tolerated Continue current medications at current doses cmp, TSH, CBC

## 2018-01-27 NOTE — Assessment & Plan Note (Signed)
Follows with dermatology Overall controlled

## 2018-01-27 NOTE — Assessment & Plan Note (Signed)
Clinically euthyroid Check tsh  Titrate med dose if needed  

## 2018-01-27 NOTE — Assessment & Plan Note (Signed)
Takes lorazepam only as needed, which is not often No side effects, effective Continue

## 2018-01-29 ENCOUNTER — Encounter: Payer: Self-pay | Admitting: Internal Medicine

## 2018-02-02 DIAGNOSIS — Z1231 Encounter for screening mammogram for malignant neoplasm of breast: Secondary | ICD-10-CM | POA: Diagnosis not present

## 2018-02-02 LAB — HM MAMMOGRAPHY

## 2018-02-06 ENCOUNTER — Other Ambulatory Visit: Payer: Self-pay | Admitting: Internal Medicine

## 2018-03-05 ENCOUNTER — Encounter: Payer: Self-pay | Admitting: Internal Medicine

## 2018-03-24 DIAGNOSIS — M79671 Pain in right foot: Secondary | ICD-10-CM | POA: Diagnosis not present

## 2018-04-03 ENCOUNTER — Encounter: Payer: Self-pay | Admitting: Internal Medicine

## 2018-04-03 NOTE — Telephone Encounter (Signed)
Agree with referral if needed

## 2018-04-15 DIAGNOSIS — M542 Cervicalgia: Secondary | ICD-10-CM | POA: Diagnosis not present

## 2018-04-15 DIAGNOSIS — M79671 Pain in right foot: Secondary | ICD-10-CM | POA: Diagnosis not present

## 2018-04-17 ENCOUNTER — Other Ambulatory Visit: Payer: Self-pay | Admitting: Internal Medicine

## 2018-04-17 DIAGNOSIS — I10 Essential (primary) hypertension: Secondary | ICD-10-CM

## 2018-04-21 ENCOUNTER — Ambulatory Visit (INDEPENDENT_AMBULATORY_CARE_PROVIDER_SITE_OTHER): Payer: PPO | Admitting: Gynecology

## 2018-04-21 ENCOUNTER — Encounter: Payer: Self-pay | Admitting: Gynecology

## 2018-04-21 VITALS — BP 118/76 | Ht 61.0 in | Wt 103.0 lb

## 2018-04-21 DIAGNOSIS — Z79890 Hormone replacement therapy (postmenopausal): Secondary | ICD-10-CM | POA: Diagnosis not present

## 2018-04-21 DIAGNOSIS — Z01419 Encounter for gynecological examination (general) (routine) without abnormal findings: Secondary | ICD-10-CM

## 2018-04-21 DIAGNOSIS — M8580 Other specified disorders of bone density and structure, unspecified site: Secondary | ICD-10-CM

## 2018-04-21 DIAGNOSIS — N952 Postmenopausal atrophic vaginitis: Secondary | ICD-10-CM

## 2018-04-21 MED ORDER — ESTRADIOL 0.5 MG PO TABS: 0.5000 mg | ORAL_TABLET | Freq: Every day | ORAL | 4 refills | Status: DC

## 2018-04-21 NOTE — Progress Notes (Signed)
    [place] Jul 11, 1945 [id number]        73 y.o.  G2P2 for breast and pelvic exam.  Without gynecologic complaints.  Past medical history,surgical history, problem list, medications, allergies, family history and social history were all reviewed and documented as reviewed in the EPIC chart.  ROS:  Performed with pertinent positives and negatives included in the history, assessment and plan.   Additional significant findings : None   Exam: Caryn Bee assistant Vitals:   04/21/18 1406  BP: 118/76  Weight: 103 lb (46.7 kg)  Height: 5\' 1"  (1.549 m)   Body mass index is 19.46 kg/m.  General appearance:  Normal affect, orientation and appearance. Skin: Grossly normal HEENT: Without gross lesions.  No cervical or supraclavicular adenopathy. Thyroid normal.  Lungs:  Clear without wheezing, rales or rhonchi Cardiac: RR, without RMG Abdominal:  Soft, nontender, without masses, guarding, rebound, organomegaly or hernia Breasts:  Examined lying and sitting without masses, retractions, discharge or axillary adenopathy. Pelvic:  Ext, BUS, Vagina: With atrophic changes  Adnexa: Without masses or tenderness    Anus and perineum: Normal   Rectovaginal: Normal sphincter tone without palpated masses or tenderness.    Assessment/Plan:  73 y.o. G2P2 female for breast and pelvic exam  1. Postmenopausal/atrophic genital changes.  Status post TAH LSO for endometriosis.  On estradiol 0.5 mg daily.  We have again discussed the risks versus benefits to include increased risk of stroke heart attack DVT possibly with increased risk with advancing age as well as the breast cancer issue.  At this point she wants to continue but we discussed possible weaning and how to do this slowly.  Refill x1 year provided. 2. Osteopenia.  DEXA 01/2017 T score -1.5 stable from prior DEXA.  FRAX 13% / 2%.  Plan repeat DEXA next year at 2-year interval. 3. Mammography 01/2018.  Continue with annual mammography when  due.  Breast exam normal today. 4. Colonoscopy 2015.  Repeat at their recommended interval. 5. Pap smear 2019.  No Pap smear done today.  No history of significant abnormal Pap smears. 6. Health maintenance.  No routine lab work done as patient does this elsewhere.  Follow-up 1 year, sooner as needed.   Anastasio Auerbach MD, 3:08 PM 04/21/2018

## 2018-04-21 NOTE — Patient Instructions (Signed)
Follow-up in 1 year for annual exam, sooner if any issues. 

## 2018-04-22 DIAGNOSIS — M79671 Pain in right foot: Secondary | ICD-10-CM | POA: Diagnosis not present

## 2018-04-22 DIAGNOSIS — M542 Cervicalgia: Secondary | ICD-10-CM | POA: Diagnosis not present

## 2018-08-05 DIAGNOSIS — M542 Cervicalgia: Secondary | ICD-10-CM | POA: Diagnosis not present

## 2018-08-17 DIAGNOSIS — M542 Cervicalgia: Secondary | ICD-10-CM | POA: Diagnosis not present

## 2018-09-07 DIAGNOSIS — M542 Cervicalgia: Secondary | ICD-10-CM | POA: Diagnosis not present

## 2018-09-08 DIAGNOSIS — D1801 Hemangioma of skin and subcutaneous tissue: Secondary | ICD-10-CM | POA: Diagnosis not present

## 2018-09-08 DIAGNOSIS — D225 Melanocytic nevi of trunk: Secondary | ICD-10-CM | POA: Diagnosis not present

## 2018-09-08 DIAGNOSIS — L400 Psoriasis vulgaris: Secondary | ICD-10-CM | POA: Diagnosis not present

## 2018-09-08 DIAGNOSIS — L821 Other seborrheic keratosis: Secondary | ICD-10-CM | POA: Diagnosis not present

## 2018-09-08 DIAGNOSIS — D2261 Melanocytic nevi of right upper limb, including shoulder: Secondary | ICD-10-CM | POA: Diagnosis not present

## 2018-09-28 DIAGNOSIS — M542 Cervicalgia: Secondary | ICD-10-CM | POA: Diagnosis not present

## 2018-10-07 ENCOUNTER — Ambulatory Visit (INDEPENDENT_AMBULATORY_CARE_PROVIDER_SITE_OTHER): Payer: PPO

## 2018-10-07 DIAGNOSIS — Z23 Encounter for immunization: Secondary | ICD-10-CM | POA: Diagnosis not present

## 2018-10-13 DIAGNOSIS — M542 Cervicalgia: Secondary | ICD-10-CM | POA: Diagnosis not present

## 2018-11-03 ENCOUNTER — Encounter: Payer: Self-pay | Admitting: Internal Medicine

## 2018-11-03 DIAGNOSIS — M542 Cervicalgia: Secondary | ICD-10-CM | POA: Diagnosis not present

## 2018-11-11 ENCOUNTER — Other Ambulatory Visit: Payer: Self-pay

## 2018-11-11 DIAGNOSIS — I10 Essential (primary) hypertension: Secondary | ICD-10-CM

## 2018-11-11 MED ORDER — METOPROLOL SUCCINATE ER 50 MG PO TB24: ORAL_TABLET | ORAL | 0 refills | Status: DC

## 2018-11-11 MED ORDER — HYDROCHLOROTHIAZIDE 25 MG PO TABS: ORAL_TABLET | ORAL | 0 refills | Status: DC

## 2018-11-11 MED ORDER — LEVOTHYROXINE SODIUM 75 MCG PO TABS: 75.0000 ug | ORAL_TABLET | Freq: Every day | ORAL | 0 refills | Status: DC

## 2018-11-18 ENCOUNTER — Encounter: Payer: Self-pay | Admitting: Gynecology

## 2018-11-24 DIAGNOSIS — M542 Cervicalgia: Secondary | ICD-10-CM | POA: Diagnosis not present

## 2018-12-15 DIAGNOSIS — M542 Cervicalgia: Secondary | ICD-10-CM | POA: Diagnosis not present

## 2019-01-04 DIAGNOSIS — Z20828 Contact with and (suspected) exposure to other viral communicable diseases: Secondary | ICD-10-CM | POA: Diagnosis not present

## 2019-01-12 DIAGNOSIS — M542 Cervicalgia: Secondary | ICD-10-CM | POA: Diagnosis not present

## 2019-01-19 ENCOUNTER — Other Ambulatory Visit: Payer: Self-pay

## 2019-01-19 DIAGNOSIS — I10 Essential (primary) hypertension: Secondary | ICD-10-CM

## 2019-01-19 MED ORDER — HYDROCHLOROTHIAZIDE 25 MG PO TABS: ORAL_TABLET | ORAL | 0 refills | Status: DC

## 2019-01-19 MED ORDER — METOPROLOL SUCCINATE ER 50 MG PO TB24: ORAL_TABLET | ORAL | 0 refills | Status: DC

## 2019-02-01 NOTE — Patient Instructions (Addendum)
Tests ordered today. Your results will be released to [facility] (or called to you) after review.  If any changes need to be made, you will be notified at that same time.  All other Health Maintenance issues reviewed.   All recommended immunizations and age-appropriate screenings are up-to-date or discussed.  No immunization administered today.   Medications reviewed and updated.  Changes include :  none   Your prescription(s) have been submitted to your pharmacy. Please take as directed and contact our office if you believe you are having problem(s) with the medication(s).   Please followup in 1 year     Health Maintenance, Female Adopting a healthy lifestyle and getting preventive care are important in promoting health and wellness. Ask your health care provider about:  The right schedule for you to have regular tests and exams.  Things you can do on your own to prevent diseases and keep yourself healthy. What should I know about diet, weight, and exercise? Eat a healthy diet   Eat a diet that includes plenty of vegetables, fruits, low-fat dairy products, and lean protein.  Do not eat a lot of foods that are high in solid fats, added sugars, or sodium. Maintain a healthy weight Body mass index (BMI) is used to identify weight problems. It estimates body fat based on height and weight. Your health care provider can help determine your BMI and help you achieve or maintain a healthy weight. Get regular exercise Get regular exercise. This is one of the most important things you can do for your health. Most adults should:  Exercise for at least 150 minutes each week. The exercise should increase your heart rate and make you sweat (moderate-intensity exercise).  Do strengthening exercises at least twice a week. This is in addition to the moderate-intensity exercise.  Spend less time sitting. Even light physical activity can be beneficial. Watch cholesterol and blood lipids Have  your blood tested for lipids and cholesterol at 71 years of age, then have this test every 5 years. Have your cholesterol levels checked more often if:  Your lipid or cholesterol levels are high.  You are older than 71 years of age.  You are at high risk for heart disease. What should I know about cancer screening? Depending on your health history and family history, you may need to have cancer screening at various ages. This may include screening for:  Breast cancer.  Cervical cancer.  Colorectal cancer.  Skin cancer.  Lung cancer. What should I know about heart disease, diabetes, and high blood pressure? Blood pressure and heart disease  High blood pressure causes heart disease and increases the risk of stroke. This is more likely to develop in people who have high blood pressure readings, are of African descent, or are overweight.  Have your blood pressure checked: ? Every 3-5 years if you are 70-79 years of age. ? Every year if you are 3 years old or older. Diabetes Have regular diabetes screenings. This checks your fasting blood sugar level. Have the screening done:  Once every three years after age 24 if you are at a normal weight and have a low risk for diabetes.  More often and at a younger age if you are overweight or have a high risk for diabetes. What should I know about preventing infection? Hepatitis B If you have a higher risk for hepatitis B, you should be screened for this virus. Talk with your health care provider to find out if you are at  risk for hepatitis B infection. Hepatitis C Testing is recommended for:  Everyone born from [birth year] through 1963.  Anyone with known risk factors for hepatitis C. Sexually transmitted infections (STIs)  Get screened for STIs, including gonorrhea and chlamydia, if: ? You are sexually active and are younger than 71 years of age. ? You are older than 71 years of age and your health care provider tells you that you are at  risk for this type of infection. ? Your sexual activity has changed since you were last screened, and you are at increased risk for chlamydia or gonorrhea. Ask your health care provider if you are at risk.  Ask your health care provider about whether you are at high risk for HIV. Your health care provider may recommend a prescription medicine to help prevent HIV infection. If you choose to take medicine to prevent HIV, you should first get tested for HIV. You should then be tested every 3 months for as long as you are taking the medicine. Pregnancy  If you are about to stop having your period (premenopausal) and you may become pregnant, seek counseling before you get pregnant.  Take 400 to 800 micrograms (mcg) of folic acid every day if you become pregnant.  Ask for birth control (contraception) if you want to prevent pregnancy. Osteoporosis and menopause Osteoporosis is a disease in which the bones lose minerals and strength with aging. This can result in bone fractures. If you are 16 years old or older, or if you are at risk for osteoporosis and fractures, ask your health care provider if you should:  Be screened for bone loss.  Take a calcium or vitamin D supplement to lower your risk of fractures.  Be given hormone replacement therapy (HRT) to treat symptoms of menopause. Follow these instructions at home: Lifestyle  Do not use any products that contain nicotine or tobacco, such as cigarettes, e-cigarettes, and chewing tobacco. If you need help quitting, ask your health care provider.  Do not use street drugs.  Do not share needles.  Ask your health care provider for help if you need support or information about quitting drugs. Alcohol use  Do not drink alcohol if: ? Your health care provider tells you not to drink. ? You are pregnant, may be pregnant, or are planning to become pregnant.  If you drink alcohol: ? Limit how much you use to 0-1 drink a day. ? Limit intake if you  are breastfeeding.  Be aware of how much alcohol is in your drink. In the [place] one drink equals one 12 oz bottle of beer (355 mL), one 5 oz glass of wine (148 mL), or one 1 oz glass of hard liquor (44 mL). General instructions  Schedule regular health, dental, and eye exams.  Stay current with your vaccines.  Tell your health care provider if: ? You often feel depressed. ? You have ever been abused or do not feel safe at home. Summary  Adopting a healthy lifestyle and getting preventive care are important in promoting health and wellness.  Follow your health care provider's instructions about healthy diet, exercising, and getting tested or screened for diseases.  Follow your health care provider's instructions on monitoring your cholesterol and blood pressure. This information is not intended to replace advice given to you by your health care provider. Make sure you discuss any questions you have with your health care provider. Document Released: 08/13/2010 Document Revised: 01/21/2018 Document Reviewed: 01/21/2018 Elsevier Patient Education  2020 [employer]  Inc[employer]  

## 2019-02-01 NOTE — Progress Notes (Signed)
Subjective:    Patient ID: Renee Santos, female    DOB: 1945-08-16, 73 y.o.   MRN: [id number]  HPI She is here for a physical exam.   She feels good overall and has no concerns.  She still walks daily.    Medications and allergies reviewed with patient and updated if appropriate.  Patient Active Problem List   Diagnosis Date Noted  . Paronychia of great toe, left 10/08/2016  . Hyperglycemia 07/16/2016  . Dizziness 07/16/2016  . Lightheadedness 07/16/2016  . Calcific tendonitis of right hand 05/01/2015  . Herpes simplex 05/01/2015  . Situational anxiety 05/01/2015  . Essential hypertension 03/01/2008  . Osteopenia 03/01/2008  . THYROID NODULE 01/13/2007  . Hypothyroidism 01/13/2007  . HYPERLIPIDEMIA 01/13/2007  . PSORIASIS 01/13/2007    Current Outpatient Medications on File Prior to Visit  Medication Sig Dispense Refill  . BIOTIN FORTE PO Take 1 tablet by mouth.    . Calcium Carbonate-Vit D-Min (CALTRATE PLUS PO) Take by mouth daily.      . clobetasol cream (TEMOVATE) [id number] % Apply 1 application topically 2 (two) times daily. 45 g 1  . estradiol (ESTRACE) 0.5 MG tablet Take 1 tablet (0.5 mg total) by mouth daily. 90 tablet 4  . hydrochlorothiazide (HYDRODIURIL) 25 MG tablet Take 1/2 tablet by mouth daily 45 tablet 0  . levothyroxine (SYNTHROID) 75 MCG tablet Take 1 tablet (75 mcg total) by mouth daily. 90 tablet 0  . metoprolol succinate (TOPROL-XL) 50 MG 24 hr tablet Take 1/2 tablet by mouth every day 45 tablet 0  . multivitamin-lutein (OCUVITE-LUTEIN) CAPS capsule Take 1 capsule by mouth daily.     No current facility-administered medications on file prior to visit.    Past Medical History:  Diagnosis Date  . Cancer [facility]) 2011   Basal Cell of nose; Dr Martinique  . Herpes zoster 2008   posterior thorax  . Hypertension   . Neurofibroma of back 2013    Dr Amy Martinique   . Osteopenia 2016   T score -1.6 FRAX not calculated because of HRT  . Psoriasis    Dr Amy  Martinique  . Thyroid nodule     Past Surgical History:  Procedure Laterality Date  . ABDOMINAL HYSTERECTOMY      LSO  & appendectomy for endometrioma   @ age38 for ovarian cyst  . APPENDECTOMY    . cataract Bilateral Dec 2016;   not sure of date; wears readers only   . COLONOSCOPY  multiple  . OOPHORECTOMY     LSO for cyst  . TONSILLECTOMY      Social History   Socioeconomic History  . Marital status: Married    Spouse name: Not on file  . Number of children: 2  . Years of education: Not on file  . Highest education level: Not on file  Occupational History  . Not on file  Tobacco Use  . Smoking status: Former Research scientist (life sciences)  . Smokeless tobacco: Never Used  . Tobacco comment: college only socially  Substance and Sexual Activity  . Alcohol use: Yes    Alcohol/week: 3.0 standard drinks    Types: 3 Glasses of wine per week  . Drug use: No  . Sexual activity: Yes    Birth control/protection: Surgical    Comment: HYST-1st intercourse 73 yo-Fewer than 5 partners  Other Topics Concern  . Not on file  Social History Narrative   Lives with husband x 45 years in a two story home.  Has 2 children.     Retired Education officer, museum in [phone].     Education: college.   Social Determinants of Health   Financial Resource Strain:   . Difficulty of Paying Living Expenses: Not on file  Food Insecurity:   . Worried About Charity fundraiser in the Last Year: Not on file  . Ran Out of Food in the Last Year: Not on file  Transportation Needs:   . Lack of Transportation (Medical): Not on file  . Lack of Transportation (Non-Medical): Not on file  Physical Activity:   . Days of Exercise per Week: Not on file  . Minutes of Exercise per Session: Not on file  Stress:   . Feeling of Stress : Not on file  Social Connections:   . Frequency of Communication with Friends and Family: Not on file  . Frequency of Social Gatherings with Friends and Family: Not on file  . Attends Religious Services: Not on  file  . Active Member of Clubs or Organizations: Not on file  . Attends Archivist Meetings: Not on file  . Marital Status: Not on file    Family History  Problem Relation Age of Onset  . Hypertension Father   . Lung cancer Father        remote smoker, deceased 26  . Hyperlipidemia Mother        CABG in late 41s  . Hypothyroidism Mother   . Macular degeneration Mother   . Hypertension Mother   . Coronary artery disease Mother        bypass surgery  . Stroke Mother 88       Deceased  . Coronary artery disease Maternal Grandmother   . Asthma Maternal Uncle        2  . Asthma Maternal Grandfather   . Diabetes Neg Hx   . Heart attack Neg Hx   . Colon cancer Neg Hx     Review of Systems  Constitutional: Negative for chills and fever.  Eyes: Negative for visual disturbance.  Respiratory: Negative for cough, shortness of breath and wheezing.   Cardiovascular: Negative for chest pain, palpitations and leg swelling.       Three episodes of chest pain over the past year - woke her up at night, transient  Gastrointestinal: Negative for abdominal pain, blood in stool, constipation, diarrhea and nausea.       No gerd, occ leakage  Genitourinary: Negative for dysuria and hematuria.  Musculoskeletal: Negative for arthralgias and back pain.  Skin: Negative for color change and rash.  Neurological: Negative for light-headedness and headaches.  Psychiatric/Behavioral: Negative for dysphoric mood. The patient is nervous/anxious (situational).        Objective:   Vitals:   02/02/19 1349  BP: (!) 156/84  Pulse: 70  Resp: 16  Temp: 98.3 F (36.8 C)  SpO2: 99%   Filed Weights   02/02/19 1349  Weight: 102 lb (46.3 kg)   Body mass index is 19.27 kg/m.  BP Readings from Last 3 Encounters:  02/02/19 (!) 156/84  04/21/18 118/76  01/27/18 122/78    Wt Readings from Last 3 Encounters:  02/02/19 102 lb (46.3 kg)  04/21/18 103 lb (46.7 kg)  01/27/18 99 lb 6.4 oz  (45.1 kg)     Physical Exam Constitutional: She appears well-developed and well-nourished. No distress.  HENT:  Head: Normocephalic and atraumatic.  Right Ear: External ear normal. Normal ear canal and TM Left Ear: External ear normal.  Normal ear canal and TM Mouth/Throat: Oropharynx is clear and moist.  Eyes: Conjunctivae and EOM are normal.  Neck: Neck supple. No tracheal deviation present. No thyromegaly present.  No carotid bruit  Cardiovascular: Normal rate, regular rhythm and normal heart sounds.   No murmur heard.  No edema. Pulmonary/Chest: Effort normal and breath sounds normal. No respiratory distress. She has no wheezes. She has no rales.  Breast: deferred   Abdominal: Soft. She exhibits no distension. There is no tenderness.  Lymphadenopathy: She has no cervical adenopathy.  Skin: Skin is warm and dry. She is not diaphoretic.  Psychiatric: She has a normal mood and affect. Her behavior is normal.        Assessment & Plan:   Physical exam: Screening blood work    ordered Immunizations  Up to date  Colonoscopy   Up to date  Mammogram   Due now - schedule for January Gyn  Up to date  Dexa   Up to date  Eye exams   Up to date  Exercise   Regular - walking daily Weight   Normal BMI Substance abuse   none  See Problem List for Assessment and Plan of chronic medical problems.   This visit occurred during the SARS-CoV-2 public health emergency.  Safety protocols were in place, including screening questions prior to the visit, additional usage of staff PPE, and extensive cleaning of exam room while observing appropriate contact time as indicated for disinfecting solutions.

## 2019-02-02 ENCOUNTER — Encounter: Payer: Self-pay | Admitting: Internal Medicine

## 2019-02-02 ENCOUNTER — Other Ambulatory Visit: Payer: Self-pay

## 2019-02-02 ENCOUNTER — Ambulatory Visit (INDEPENDENT_AMBULATORY_CARE_PROVIDER_SITE_OTHER): Payer: PPO | Admitting: Internal Medicine

## 2019-02-02 VITALS — BP 156/84 | HR 70 | Temp 98.3°F | Resp 16 | Ht 61.0 in | Wt 102.0 lb

## 2019-02-02 DIAGNOSIS — E038 Other specified hypothyroidism: Secondary | ICD-10-CM | POA: Diagnosis not present

## 2019-02-02 DIAGNOSIS — F418 Other specified anxiety disorders: Secondary | ICD-10-CM

## 2019-02-02 DIAGNOSIS — E782 Mixed hyperlipidemia: Secondary | ICD-10-CM | POA: Diagnosis not present

## 2019-02-02 DIAGNOSIS — I10 Essential (primary) hypertension: Secondary | ICD-10-CM

## 2019-02-02 DIAGNOSIS — R739 Hyperglycemia, unspecified: Secondary | ICD-10-CM

## 2019-02-02 DIAGNOSIS — M8580 Other specified disorders of bone density and structure, unspecified site: Secondary | ICD-10-CM | POA: Diagnosis not present

## 2019-02-02 DIAGNOSIS — Z0000 Encounter for general adult medical examination without abnormal findings: Secondary | ICD-10-CM

## 2019-02-02 LAB — COMPREHENSIVE METABOLIC PANEL
ALT: 13 U/L (ref 0–35)
AST: 20 U/L (ref 0–37)
Albumin: 3.9 g/dL (ref 3.5–5.2)
Alkaline Phosphatase: 57 U/L (ref 39–117)
BUN: 22 mg/dL (ref 6–23)
CO2: 28 mEq/L (ref 19–32)
Calcium: 9.8 mg/dL (ref 8.4–10.5)
Chloride: 100 mEq/L (ref 96–112)
Creatinine, Ser: 1.05 mg/dL (ref 0.40–1.20)
GFR: 51.55 mL/min — ABNORMAL LOW (ref >60.00)
Glucose, Bld: 84 mg/dL (ref 70–99)
Potassium: 3.9 mEq/L (ref 3.5–5.1)
Sodium: 136 mEq/L (ref 135–145)
Total Bilirubin: 0.6 mg/dL (ref 0.2–1.2)
Total Protein: 6.9 g/dL (ref 6.0–8.3)

## 2019-02-02 LAB — LIPID PANEL
Cholesterol: 203 mg/dL — ABNORMAL HIGH (ref 0–200)
HDL: 98 mg/dL (ref >39.00)
LDL Cholesterol: 88 mg/dL (ref 0–99)
NonHDL: 104.56
Total CHOL/HDL Ratio: 2
Triglycerides: 85 mg/dL (ref 0.0–149.0)
VLDL: 17 mg/dL (ref 0.0–40.0)

## 2019-02-02 LAB — CBC WITH DIFFERENTIAL/PLATELET
Basophils Absolute: 0.1 10*3/uL (ref 0.0–0.1)
Basophils Relative: 1 % (ref 0.0–3.0)
Eosinophils Absolute: 0 10*3/uL (ref 0.0–0.7)
Eosinophils Relative: 0.6 % (ref 0.0–5.0)
HCT: 38.1 % (ref 36.0–46.0)
Hemoglobin: 12.8 g/dL (ref 12.0–15.0)
Lymphocytes Relative: 38.8 % (ref 12.0–46.0)
Lymphs Abs: 2.2 10*3/uL (ref 0.7–4.0)
MCHC: 33.7 g/dL (ref 30.0–36.0)
MCV: 96.1 fl (ref 78.0–100.0)
Monocytes Absolute: 0.5 10*3/uL (ref 0.1–1.0)
Monocytes Relative: 9.1 % (ref 3.0–12.0)
Neutro Abs: 2.8 10*3/uL (ref 1.4–7.7)
Neutrophils Relative %: 50.5 % (ref 43.0–77.0)
Platelets: 258 10*3/uL (ref 150.0–400.0)
RBC: 3.96 Mil/uL (ref 3.87–5.11)
RDW: 11.5 % (ref 11.5–15.5)
WBC: 5.6 10*3/uL (ref 4.0–10.5)

## 2019-02-02 LAB — TSH: TSH: 0.51 u[IU]/mL (ref 0.35–4.50)

## 2019-02-02 LAB — HEMOGLOBIN A1C: Hgb A1c MFr Bld: 5.3 % (ref 4.6–6.5)

## 2019-02-02 MED ORDER — LORAZEPAM 0.5 MG PO TABS: 0.5000 mg | ORAL_TABLET | Freq: Two times a day (BID) | ORAL | 1 refills | Status: DC | PRN

## 2019-02-02 NOTE — Assessment & Plan Note (Signed)
Occasional anxiety Takes only on occasion No side effects, works well Will refill

## 2019-02-07 ENCOUNTER — Encounter: Payer: Self-pay | Admitting: Internal Medicine

## 2019-02-08 DIAGNOSIS — M542 Cervicalgia: Secondary | ICD-10-CM | POA: Diagnosis not present

## 2019-02-17 DIAGNOSIS — Z1231 Encounter for screening mammogram for malignant neoplasm of breast: Secondary | ICD-10-CM | POA: Diagnosis not present

## 2019-03-01 DIAGNOSIS — M542 Cervicalgia: Secondary | ICD-10-CM | POA: Diagnosis not present

## 2019-03-09 ENCOUNTER — Ambulatory Visit: Payer: PPO

## 2019-03-18 ENCOUNTER — Ambulatory Visit: Payer: PPO

## 2019-03-23 DIAGNOSIS — M542 Cervicalgia: Secondary | ICD-10-CM | POA: Diagnosis not present

## 2019-04-14 DIAGNOSIS — M542 Cervicalgia: Secondary | ICD-10-CM | POA: Diagnosis not present

## 2019-05-05 DIAGNOSIS — M542 Cervicalgia: Secondary | ICD-10-CM | POA: Diagnosis not present

## 2019-05-21 ENCOUNTER — Telehealth: Payer: Self-pay | Admitting: Internal Medicine

## 2019-05-21 ENCOUNTER — Telehealth: Payer: Self-pay

## 2019-05-21 DIAGNOSIS — I10 Essential (primary) hypertension: Secondary | ICD-10-CM

## 2019-05-21 MED ORDER — LEVOTHYROXINE SODIUM 75 MCG PO TABS: 75.0000 ug | ORAL_TABLET | Freq: Every day | ORAL | 0 refills | Status: DC

## 2019-05-21 MED ORDER — LEVOTHYROXINE SODIUM 75 MCG PO TABS: 75.0000 ug | ORAL_TABLET | Freq: Every day | ORAL | 2 refills | Status: DC

## 2019-05-21 MED ORDER — METOPROLOL SUCCINATE ER 50 MG PO TB24: ORAL_TABLET | ORAL | 2 refills | Status: DC

## 2019-05-21 MED ORDER — HYDROCHLOROTHIAZIDE 25 MG PO TABS: ORAL_TABLET | ORAL | 2 refills | Status: DC

## 2019-05-21 NOTE — Telephone Encounter (Signed)
New message:   1.Medication Requested: metoprolol succinate (TOPROL-XL) 50 MG 24 hr tablet levothyroxine (SYNTHROID) 75 MCG tablet hydrochlorothiazide (HYDRODIURIL) 25 MG tablet 2. Pharmacy (Name, [place] [place]): [facility] - [place], [place] 3. On Med List: Yes  4. Last Visit with PCP: 02/02/19  5. Next visit date with PCP:   Pt states she is completely out of the levothyroxine and would like a small refill sent to [place], [place] - [place] [address] If possible until she receives her mail order. She states she has not taken this medication in a week. Agent: Please be advised that RX refills may take up to 3 business days. We ask that you follow-up with your pharmacy.

## 2019-05-21 NOTE — Telephone Encounter (Signed)
Yes, ok 

## 2019-05-21 NOTE — Telephone Encounter (Signed)
New message   Is this okay with the MD to fill the  prescription?  levothyroxine (SYNTHROID) 75 MCG tablet the new manufacture is [employer] previous with [facility].

## 2019-05-21 NOTE — Telephone Encounter (Signed)
Reviewed chart pt is up-to-date sent refills to [facility].../[facility]  

## 2019-05-21 NOTE — Telephone Encounter (Signed)
Contacted @ [facility]) [phone] with the verbal OK from Dr. Quay Burow to fill medication levothyroxine (SYNTHROID) 22mcg under another manufacture.

## 2019-05-26 DIAGNOSIS — M542 Cervicalgia: Secondary | ICD-10-CM | POA: Diagnosis not present

## 2019-05-28 ENCOUNTER — Other Ambulatory Visit: Payer: Self-pay

## 2019-05-31 ENCOUNTER — Encounter: Payer: Self-pay | Admitting: Obstetrics and Gynecology

## 2019-05-31 ENCOUNTER — Other Ambulatory Visit: Payer: Self-pay

## 2019-05-31 ENCOUNTER — Ambulatory Visit (INDEPENDENT_AMBULATORY_CARE_PROVIDER_SITE_OTHER): Payer: PPO | Admitting: Obstetrics and Gynecology

## 2019-05-31 VITALS — BP 118/76 | Ht 61.5 in | Wt 101.0 lb

## 2019-05-31 DIAGNOSIS — R194 Change in bowel habit: Secondary | ICD-10-CM

## 2019-05-31 DIAGNOSIS — Z01419 Encounter for gynecological examination (general) (routine) without abnormal findings: Secondary | ICD-10-CM

## 2019-05-31 DIAGNOSIS — M8580 Other specified disorders of bone density and structure, unspecified site: Secondary | ICD-10-CM | POA: Diagnosis not present

## 2019-05-31 DIAGNOSIS — Z79890 Hormone replacement therapy (postmenopausal): Secondary | ICD-10-CM | POA: Diagnosis not present

## 2019-05-31 MED ORDER — ESTRADIOL 0.5 MG PO TABS: 0.5000 mg | ORAL_TABLET | Freq: Every day | ORAL | 4 refills | Status: DC

## 2019-05-31 NOTE — Progress Notes (Signed)
Renee Santos 09/09/1945 [id number]  SUBJECTIVE:  73 y.o. G2P2 female here for an annual routine gynecologic exam and Pap smear. She has no gynecologic concerns.  Her concern today is that she has been noticing increasing frequency and unpredictability of defecation despite dietary changes.  Sometimes feels like she is not completely evacuating her bowels.  Current Outpatient Medications  Medication Sig Dispense Refill  . BIOTIN FORTE PO Take 1 tablet by mouth.    . Calcium Carbonate-Vit D-Min (CALTRATE PLUS PO) Take by mouth daily.      . clobetasol cream (TEMOVATE) [id number] % Apply 1 application topically 2 (two) times daily. 45 g 1  . estradiol (ESTRACE) 0.5 MG tablet Take 1 tablet (0.5 mg total) by mouth daily. 90 tablet 4  . hydrochlorothiazide (HYDRODIURIL) 25 MG tablet Take 1/2 tablet by mouth daily 45 tablet 2  . levothyroxine (SYNTHROID) 75 MCG tablet Take 1 tablet (75 mcg total) by mouth daily. 90 tablet 2  . LORazepam (ATIVAN) 0.5 MG tablet Take 1 tablet (0.5 mg total) by mouth 2 (two) times daily as needed. for anxiety 15 tablet 1  . metoprolol succinate (TOPROL-XL) 50 MG 24 hr tablet Take 1/2 tablet by mouth every day 45 tablet 2  . multivitamin-lutein (OCUVITE-LUTEIN) CAPS capsule Take 1 capsule by mouth daily.     No current facility-administered medications for this visit.   Allergies: Patient has no known allergies.  No LMP recorded. Patient has had a hysterectomy.  Past medical history,surgical history, problem list, medications, allergies, family history and social history were all reviewed and documented as reviewed in the EPIC chart.  ROS:  Feeling well. No dyspnea or chest pain on exertion.  No abdominal pain, change in bowel habits, black or bloody stools.  No urinary tract symptoms. GYN ROS: no abnormal bleeding, pelvic pain or discharge, no breast pain or new or enlarging lumps on self exam. No neurological complaints.   OBJECTIVE:  BP 118/76   Ht 5' 1.5" (1.562  m)   Wt 101 lb (45.8 kg)   BMI 18.77 kg/m  The patient appears well, alert, oriented x 3, in no distress. ENT normal.  Neck supple. No cervical or supraclavicular adenopathy or thyromegaly.  Lungs are clear, good air entry, no wheezes, rhonchi or rales. S1 and S2 normal, no murmurs, regular rate and rhythm.  Abdomen soft without tenderness, guarding, mass or organomegaly.  Neurological is normal, no focal findings.  BREAST EXAM: breasts appear normal, no suspicious masses, no skin or nipple changes or axillary nodes  PELVIC EXAM: VULVA: normal appearing vulva with no masses, tenderness or lesions, VAGINA: normal appearing vagina with normal color and discharge, no lesions, CERVIX: surgically absent, UTERUS: surgically absent, vaginal cuff normal, ADNEXA: no masses, RECTAL: normal rectal, no masses, weakened tone  Chaperone: Caryn Bee present during the examination  ASSESSMENT:  74 y.o. G2P2 here for annual gynecologic exam  PLAN:   1. Postmenopausal/HRT. Prior TAH LSO for endometriosis.  Maintained on estradiol 0.5 mg daily.  We discussed the risks to include thrombotic diseases of heart attack, stroke, DVT, PE, particularly with continued use and advancing age, and breast cancer concern.  She would like to continue use with these risks in mind as she has tried to wean before and had a lot of negative symptoms with worsening hot flashes.  Refill of estradiol is provided for 1 year. 2. Pap smear 2019.  No significant history of abnormal Pap smears.  Next Pap smear due 2022 if she  does wish to continue screening.  Readdress at that time. 3. Mammogram 01/2019.  Normal breast exam today.  She is reminded to schedule an annual mammogram this year when due. 4. Colonoscopy 2015.  Recommended that she follow up at the recommended interval.   5. DEXA 01/2017 T score -1.5, stable from prior DEXA.  FRAX 13% / 2%.  I recommend repeating the DEXA this year and she will plan to do so when she has her  breast imaging.   6.  Bowel function concerns.  Could have IBS, no sign of rectocele.  Weakened rectal tone noted on examination today.  I did recommend that she discuss these concerns with her primary care doctor and she does have an upcoming appointment. 7. Health maintenance.  No labs today as she normally has these completed elsewhere.  Return annually or sooner, prn.  Joseph Pierini MD 05/31/19

## 2019-06-16 DIAGNOSIS — M542 Cervicalgia: Secondary | ICD-10-CM | POA: Diagnosis not present

## 2019-06-17 ENCOUNTER — Ambulatory Visit (INDEPENDENT_AMBULATORY_CARE_PROVIDER_SITE_OTHER): Payer: PPO

## 2019-06-17 ENCOUNTER — Other Ambulatory Visit: Payer: Self-pay

## 2019-06-17 VITALS — BP 102/70 | HR 61 | Temp 97.6°F | Resp 16 | Ht 62.0 in | Wt 102.6 lb

## 2019-06-17 DIAGNOSIS — Z0000 Encounter for general adult medical examination without abnormal findings: Secondary | ICD-10-CM

## 2019-06-17 NOTE — Progress Notes (Signed)
Subjective:   Renee Santos is a 74 y.o. female who presents for Medicare Annual (Subsequent) preventive examination.  Review of Systems:  No ROS. Medicare Wellness Visit. Additional risk factors are reflected in social history. Cardiac Risk Factors include: advanced age (>4men, >44 women);dyslipidemia;family history of premature cardiovascular disease;hypertension  Sleep Patterns: No sleep issues, feels rested on waking and sleeps 5-6 hours nightly. Home Safety/Smoke Alarms: Feels safe in home; uses home alarm. Smoke alarms in place. Living environment: Lives in a 2-story home with husband; no needs for DME, good family support system. Seat Belt Safety/Bike Helmet: Wears seat belt.    Objective:     Vitals: BP 102/70 (BP Location: Right Arm, Patient Position: Sitting, Cuff Size: Small)   Pulse 61   Temp 97.6 F (36.4 C)   Resp 16   Ht 5\' 2"  (1.575 m)   Wt 102 lb 9.6 oz (46.5 kg)   SpO2 99%   BMI 18.77 kg/m   Body mass index is 18.77 kg/m.  Advanced Directives 06/17/2019 12/30/2017 12/24/2016 07/25/2015 06/15/2014 12/16/2013  Does Patient Have a Medical Advance Directive? Yes Yes Yes Yes Yes Yes  Type of Paramedic of [place];Living will [place];Living will [place];Living will - - [place];Living will  Does patient want to make changes to medical advance directive? No - Patient declined - - - - -  Copy of [place] in Chart? No - copy requested No - copy requested No - copy requested - Yes -    Tobacco Social History   Tobacco Use  Smoking Status Former Smoker  Smokeless Tobacco Never Used  Tobacco Comment   college only socially     Counseling given: No Comment: college only socially   Clinical Intake:  Pre-visit preparation completed: Yes  Pain : No/denies pain Pain Score: 0-No pain     BMI - recorded: 18.8 Nutritional Status: BMI <19   Underweight Nutritional Risks: Nausea/ vomitting/ diarrhea(issues with bowels) Diabetes: No  How often do you need to have someone help you when you read instructions, pamphlets, or other written materials from your doctor or pharmacy?: 1 - Never What is the last grade level you completed in school?: College; Retired Acupuncturist Needed?: No  Information entered by :: [employer]. [employer], LPN  Past Medical History:  Diagnosis Date  . Cancer [facility]) 2011   Basal Cell of nose; Dr Martinique  . Herpes zoster 2008   posterior thorax  . Hypertension   . Neurofibroma of back 2013    Dr Amy Martinique   . Osteopenia 2016   T score -1.6 FRAX not calculated because of HRT  . Psoriasis    Dr Amy Martinique  . Thyroid nodule    Past Surgical History:  Procedure Laterality Date  . ABDOMINAL HYSTERECTOMY      LSO  & appendectomy for endometrioma   @ age38 for ovarian cyst  . APPENDECTOMY    . cataract Bilateral Dec 2016;   not sure of date; wears readers only   . COLONOSCOPY  multiple  . OOPHORECTOMY     LSO for cyst  . TONSILLECTOMY     Family History  Problem Relation Age of Onset  . Hypertension Father   . Lung cancer Father        remote smoker, deceased 55  . Hyperlipidemia Mother        CABG in late 34s  .  Hypothyroidism Mother   . Macular degeneration Mother   . Hypertension Mother   . Coronary artery disease Mother        bypass surgery  . Stroke Mother 5       Deceased  . Coronary artery disease Maternal Grandmother   . Asthma Maternal Uncle        2  . Asthma Maternal Grandfather   . Diabetes Neg Hx   . Heart attack Neg Hx   . Colon cancer Neg Hx    Social History   Socioeconomic History  . Marital status: Married    Spouse name: Not on file  . Number of children: 2  . Years of education: Not on file  . Highest education level: Not on file  Occupational History  . Not on file  Tobacco Use  . Smoking status: Former Research scientist (life sciences)  . Smokeless tobacco:  Never Used  . Tobacco comment: college only socially  Substance and Sexual Activity  . Alcohol use: Yes    Alcohol/week: 3.0 standard drinks    Types: 3 Glasses of wine per week  . Drug use: No  . Sexual activity: Yes    Birth control/protection: Surgical    Comment: HYST-1st intercourse 74 yo-Fewer than 5 partners  Other Topics Concern  . Not on file  Social History Narrative   Lives with husband x 45 years in a two story home.  Has 2 children.     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     Education: college.   Social Determinants of Health   Financial Resource Strain:   . Difficulty of Paying Living Expenses:   Food Insecurity:   . Worried About Charity fundraiser in the Last Year:   . Arboriculturist in the Last Year:   Transportation Needs:   . Film/video editor (Medical):   Marland Kitchen Lack of Transportation (Non-Medical):   Physical Activity:   . Days of Exercise per Week:   . Minutes of Exercise per Session:   Stress:   . Feeling of Stress :   Social Connections:   . Frequency of Communication with Friends and Family:   . Frequency of Social Gatherings with Friends and Family:   . Attends Religious Services:   . Active Member of Clubs or Organizations:   . Attends Archivist Meetings:   Marland Kitchen Marital Status:     Outpatient Encounter Medications as of 06/17/2019  Medication Sig  . BIOTIN FORTE PO Take 1 tablet by mouth.  . Calcium Carbonate-Vit D-Min (CALTRATE PLUS PO) Take by mouth daily.    . clobetasol cream (TEMOVATE) [id number] % Apply 1 application topically 2 (two) times daily.  Marland Kitchen estradiol (ESTRACE) 0.5 MG tablet Take 1 tablet (0.5 mg total) by mouth daily.  . hydrochlorothiazide (HYDRODIURIL) 25 MG tablet Take 1/2 tablet by mouth daily  . levothyroxine (SYNTHROID) 75 MCG tablet Take 1 tablet (75 mcg total) by mouth daily.  Marland Kitchen LORazepam (ATIVAN) 0.5 MG tablet Take 1 tablet (0.5 mg total) by mouth 2 (two) times daily as needed. for anxiety  . metoprolol succinate  (TOPROL-XL) 50 MG 24 hr tablet Take 1/2 tablet by mouth every day  . multivitamin-lutein (OCUVITE-LUTEIN) CAPS capsule Take 1 capsule by mouth daily.   No facility-administered encounter medications on file as of 06/17/2019.    Activities of Daily Living In your present state of health, do you have any difficulty performing the following activities: 06/17/2019  Hearing? N  Vision? N  Difficulty concentrating or making decisions? N  Walking or climbing stairs? N  Dressing or bathing? N  Preparing Food and eating ? N  Using the Toilet? N  In the past six months, have you accidently leaked urine? Y  Do you have problems with loss of bowel control? Y  Managing your Medications? N  Managing your Finances? N  Housekeeping or managing your Housekeeping? N  Some recent data might be hidden    Patient Care Team: Binnie Rail, MD as PCP - General (Internal Medicine) Martinique, Amy, MD as Consulting Physician (Dermatology) Phineas Real Belinda Block, MD as Consulting Physician (Gynecology)    Assessment:   This is a routine wellness examination for [facility].  Exercise Activities and Dietary recommendations Current Exercise Habits: Home exercise routine(walks 5 miles every day; plays golf), Type of exercise: walking;Other - see comments(plays golf), Time (Minutes): 35, Frequency (Times/Week): 5, Weekly Exercise (Minutes/Week): 175, Intensity: Moderate, Exercise limited by: None identified  Goals    . <enter goal here> (pt-stated)     Wants to continue to be healthy  Including walking; trips; add light weights;   Viniyoga; [email or web address] for stress reduction      . Client understands the importance of follow-up with providers by attending scheduled visits    . patient     Goal is to travel again; plan a trip; Add weights; low and slow;     . Patient Stated     Continue to travel and go on pilgrimages. Enjoy life and love family.       Fall Risk Fall Risk  06/17/2019 02/02/2019 12/30/2017  12/24/2016 07/25/2015  Falls in the past year? 0 0 0 No No  Number falls in past yr: 0 0 - - -  Injury with Fall? 0 - - - -  Risk for fall due to : No Fall Risks - - - -  Follow up Falls evaluation completed;Education provided - - - -   Is the patient's home free of loose throw rugs in walkways, pet beds, electrical cords, etc?   yes      Grab bars in the bathroom? yes      Handrails on the stairs?   yes      Adequate lighting?   yes   Depression Screen PHQ 2/9 Scores 06/17/2019 02/02/2019 12/30/2017 12/24/2016  PHQ - 2 Score 0 0 0 0  PHQ- 9 Score - - 1 2     Cognitive Function - refused 06/17/2019 MMSE - Mini Mental State Exam 12/24/2016  Orientation to time 5  Orientation to Place 5  Registration 3  Attention/ Calculation 3  Recall 2  Language- name 2 objects 2  Language- repeat 1  Language- follow 3 step command 3  Language- read & follow direction 1  Write a sentence 1  Copy design 1  Total score 27        Immunization History  Administered Date(s) Administered  . Fluad Quad(high Dose 65+) 10/07/2018  . H1N1 03/01/2008  . Hepatitis A 07/05/1997, 04/19/2004  . Hepatitis B 05/30/2008, 07/05/2008, 03/27/2011  . Influenza Split 12/04/2010, 11/05/2011  . Influenza, High Dose Seasonal PF 12/27/2015, 10/08/2016  . Influenza,inj,Quad PF,6+ Mos 12/03/2012, 12/15/2013, 12/21/2014, 12/01/2017  . Influenza-Unspecified 11/11/2017  . Meningococcal Conjugate 07/05/2008  . Pneumococcal Conjugate-13 06/15/2014  . Pneumococcal Polysaccharide-23 04/13/2013  . Tdap 04/21/2017  . Tetanus 02/21/2014  . Typhoid Inactivated 04/19/2004, 04/21/2017  . Yellow Fever 05/30/2008  . Zoster 02/18/2014    Qualifies for Shingles Vaccine? Yes,  will check with local pharmacy.  Screening Tests Health Maintenance  Topic Date Due  . COVID-19 Vaccine (1) Never done  . INFLUENZA VACCINE  09/12/2019  . DEXA SCAN  01/31/2020  . MAMMOGRAM  02/03/2020  . COLONOSCOPY  12/31/2023  . TETANUS/TDAP   04/22/2027  . Hepatitis C Screening  Completed  . PNA vac Low Risk Adult  Completed    Cancer Screenings: Lung: Low Dose CT Chest recommended if Age 66-80 years, 30 pack-year currently smoking OR have quit w/in 15years. Patient does not qualify. Breast:  Up to date on Mammogram? Yes   Up to date of Bone Density/Dexa? Yes Colorectal: Yes     Plan:   Reviewed health maintenance screenings with patient today and relevant education, vaccines, and/or referrals were provided.    Continue doing brain stimulating activities (puzzles, reading, adult coloring books, staying active) to keep memory sharp.    Continue to eat heart healthy diet (full of fruits, vegetables, whole grains, lean protein, water--limit salt, fat, and sugar intake) and increase physical activity as tolerated.  I have personally reviewed and noted the following in the patient's chart:   . Medical and social history . Use of alcohol, tobacco or illicit drugs  . Current medications and supplements . Functional ability and status . Nutritional status . Physical activity . Advanced directives . List of other physicians . Hospitalizations, surgeries, and ER visits in previous 12 months . Vitals . Screenings to include cognitive, depression, and falls . Referrals and appointments  In addition, I have reviewed and discussed with patient certain preventive protocols, quality metrics, and best practice recommendations. A written personalized care plan for preventive services as well as general preventive health recommendations were provided to patient.     Sheral Flow, LPN  [id number]  Nurse Health Advisor

## 2019-06-17 NOTE — Patient Instructions (Addendum)
Renee Santos , Thank you for taking time to come for your Medicare Wellness Visit. I appreciate your ongoing commitment to your health goals. Please review the following plan we discussed and let me know if I can assist you in the future.   Screening recommendations/referrals: Colorectal Screening: due 12/2023 Mammogram: last done 02/17/2019 Bone Density: last done 01/30/2017  Vision and Dental Exams: Recommended annual ophthalmology exams for early detection of glaucoma and other disorders of the eye Recommended annual dental exams for proper oral hygiene  Vaccinations: Influenza vaccine: 10/07/2018 Pneumococcal vaccine: completed; Prevnar 06/15/2014, Pneumovax 04/13/2013 Tdap vaccine: 04/21/2017; due every 10 years Shingles vaccine: Please call your insurance company to determine your out of pocket expense for the Shingrix vaccine. You may receive this vaccine at your local pharmacy. Covid vaccine: completed; [place] 03/08/2019, 03/29/2019  Advanced directives: Advance directives discussed with you today. Please bring a copy of your POA (Power of [place]) and/or Living Will to your next appointment.  Goals:  Recommend to drink at least 6-8 8oz glasses of water per day.  Recommend to exercise for at least 150 minutes per week.  Recommend to remove any items from the home that may cause slips or trips.  Recommend to decrease portion sizes by eating 3 small healthy meals and at least 2 healthy snacks per day.  Recommend to begin DASH diet as directed below  Recommend to continue efforts to reduce smoking habits until no longer smoking. Smoking Cessation literature is attached below.  Next appointment: Please schedule your Annual Wellness Visit with your Nurse Health Advisor in one year.  Preventive Care 36 Years and Older, Female Preventive care refers to lifestyle choices and visits with your health care provider that can promote health and wellness. What does preventive care include?  A  yearly physical exam. This is also called an annual well check.  Dental exams once or twice a year.  Routine eye exams. Ask your health care provider how often you should have your eyes checked.  Personal lifestyle choices, including:  Daily care of your teeth and gums.  Regular physical activity.  Eating a healthy diet.  Avoiding tobacco and drug use.  Limiting alcohol use.  Practicing safe sex.  Taking low-dose aspirin every day if recommended by your health care provider.  Taking vitamin and mineral supplements as recommended by your health care provider. What happens during an annual well check? The services and screenings done by your health care provider during your annual well check will depend on your age, overall health, lifestyle risk factors, and family history of disease. Counseling  Your health care provider may ask you questions about your:  Alcohol use.  Tobacco use.  Drug use.  Emotional well-being.  Home and relationship well-being.  Sexual activity.  Eating habits.  History of falls.  Memory and ability to understand (cognition).  Work and work Statistician.  Reproductive health. Screening  You may have the following tests or measurements:  Height, weight, and BMI.  Blood pressure.  Lipid and cholesterol levels. These may be checked every 5 years, or more frequently if you are over 32 years old.  Skin check.  Lung cancer screening. You may have this screening every year starting at age 62 if you have a 30-pack-year history of smoking and currently smoke or have quit within the past 15 years.  Fecal occult blood test (FOBT) of the stool. You may have this test every year starting at age 2.  Flexible sigmoidoscopy or colonoscopy. You may  have a sigmoidoscopy every 5 years or a colonoscopy every 10 years starting at age 76.  Hepatitis C blood test.  Hepatitis B blood test.  Sexually transmitted disease (STD) testing.  Diabetes  screening. This is done by checking your blood sugar (glucose) after you have not eaten for a while (fasting). You may have this done every 1-3 years.  Bone density scan. This is done to screen for osteoporosis. You may have this done starting at age 16.  Mammogram. This may be done every 1-2 years. Talk to your health care provider about how often you should have regular mammograms. Talk with your health care provider about your test results, treatment options, and if necessary, the need for more tests. Vaccines  Your health care provider may recommend certain vaccines, such as:  Influenza vaccine. This is recommended every year.  Tetanus, diphtheria, and acellular pertussis (Tdap, Td) vaccine. You may need a Td booster every 10 years.  Zoster vaccine. You may need this after age 28.  Pneumococcal 13-valent conjugate (PCV13) vaccine. One dose is recommended after age 46.  Pneumococcal polysaccharide (PPSV23) vaccine. One dose is recommended after age 59. Talk to your health care provider about which screenings and vaccines you need and how often you need them. This information is not intended to replace advice given to you by your health care provider. Make sure you discuss any questions you have with your health care provider. Document Released: 02/24/2015 Document Revised: 10/18/2015 Document Reviewed: 11/29/2014 Elsevier Interactive Patient Education  2017 [place] Prevention in the Home Falls can cause injuries. They can happen to people of all ages. There are many things you can do to make your home safe and to help prevent falls. What can I do on the outside of my home?  Regularly fix the edges of walkways and driveways and fix any cracks.  Remove anything that might make you trip as you walk through a door, such as a raised step or threshold.  Trim any bushes or trees on the path to your home.  Use bright outdoor lighting.  Clear any walking paths of anything  that might make someone trip, such as rocks or tools.  Regularly check to see if handrails are loose or broken. Make sure that both sides of any steps have handrails.  Any raised decks and porches should have guardrails on the edges.  Have any leaves, snow, or ice cleared regularly.  Use sand or salt on walking paths during winter.  Clean up any spills in your garage right away. This includes oil or grease spills. What can I do in the bathroom?  Use night lights.  Install grab bars by the toilet and in the tub and shower. Do not use towel bars as grab bars.  Use non-skid mats or decals in the tub or shower.  If you need to sit down in the shower, use a plastic, non-slip stool.  Keep the floor dry. Clean up any water that spills on the floor as soon as it happens.  Remove soap buildup in the tub or shower regularly.  Attach bath mats securely with double-sided non-slip rug tape.  Do not have throw rugs and other things on the floor that can make you trip. What can I do in the bedroom?  Use night lights.  Make sure that you have a light by your bed that is easy to reach.  Do not use any sheets or blankets that are too big for your  bed. They should not hang down onto the floor.  Have a firm chair that has side arms. You can use this for support while you get dressed.  Do not have throw rugs and other things on the floor that can make you trip. What can I do in the kitchen?  Clean up any spills right away.  Avoid walking on wet floors.  Keep items that you use a lot in easy-to-reach places.  If you need to reach something above you, use a strong step stool that has a grab bar.  Keep electrical cords out of the way.  Do not use floor polish or wax that makes floors slippery. If you must use wax, use non-skid floor wax.  Do not have throw rugs and other things on the floor that can make you trip. What can I do with my stairs?  Do not leave any items on the  stairs.  Make sure that there are handrails on both sides of the stairs and use them. Fix handrails that are broken or loose. Make sure that handrails are as long as the stairways.  Check any carpeting to make sure that it is firmly attached to the stairs. Fix any carpet that is loose or worn.  Avoid having throw rugs at the top or bottom of the stairs. If you do have throw rugs, attach them to the floor with carpet tape.  Make sure that you have a light switch at the top of the stairs and the bottom of the stairs. If you do not have them, ask someone to add them for you. What else can I do to help prevent falls?  Wear shoes that:  Do not have high heels.  Have rubber bottoms.  Are comfortable and fit you well.  Are closed at the toe. Do not wear sandals.  If you use a stepladder:  Make sure that it is fully opened. Do not climb a closed stepladder.  Make sure that both sides of the stepladder are locked into place.  Ask someone to hold it for you, if possible.  Clearly mark and make sure that you can see:  Any grab bars or handrails.  First and last steps.  Where the edge of each step is.  Use tools that help you move around (mobility aids) if they are needed. These include:  Canes.  Walkers.  Scooters.  Crutches.  Turn on the lights when you go into a dark area. Replace any light bulbs as soon as they burn out.  Set up your furniture so you have a clear path. Avoid moving your furniture around.  If any of your floors are uneven, fix them.  If there are any pets around you, be aware of where they are.  Review your medicines with your doctor. Some medicines can make you feel dizzy. This can increase your chance of falling. Ask your doctor what other things that you can do to help prevent falls. This information is not intended to replace advice given to you by your health care provider. Make sure you discuss any questions you have with your health care  provider. Document Released: 11/24/2008 Document Revised: 07/06/2015 Document Reviewed: 03/04/2014 Elsevier Interactive Patient Education  2017 [employer].

## 2019-06-18 ENCOUNTER — Telehealth: Payer: Self-pay | Admitting: Internal Medicine

## 2019-06-18 DIAGNOSIS — R194 Change in bowel habit: Secondary | ICD-10-CM

## 2019-06-18 NOTE — Telephone Encounter (Signed)
Gave advise below. Would like to go ahead and do a referral to GI.

## 2019-06-18 NOTE — Telephone Encounter (Signed)
She should see GI, but try the benefiber or metamucil again -- she may need to take twice daily  - this should bulk up her stool and give her more control and help completely empty her bowels

## 2019-06-18 NOTE — Telephone Encounter (Signed)
-----   Message from Sheral Flow, [place] sent at [id number]  2:39 PM EDT ----- Regarding: Referral to GI Patient advised me that she is having problems controlling her bowels.  She stated that it started about 6 months ago.  She loves to go out and walk with her friends every morning and she has started wearing a Depend because she will let go in her pants.  She feels like she has no control over it or she feels that she is not completely emptying her bowels.  There is no cramping, blood or pain.  She stated that its doesn't feel like diarrhea or constipation.  She has tried Benefiber, Probiotics and smoothies with fresh fruit and Activia yogurt.  The fecal matter is brown in color and always leaves a stain.  She would like a recommendation.  I advised her to contact Dr. Carlean Purl, but she wanted to inform you first to get your opinion.  Mignon Pine

## 2019-06-30 ENCOUNTER — Ambulatory Visit: Payer: PPO

## 2019-07-05 ENCOUNTER — Encounter: Payer: Self-pay | Admitting: Nurse Practitioner

## 2019-07-07 DIAGNOSIS — M542 Cervicalgia: Secondary | ICD-10-CM | POA: Diagnosis not present

## 2019-08-02 NOTE — Progress Notes (Addendum)
08/02/2019 Renee Santos [id number] 1945-12-14   CHIEF COMPLAINT: fecal incontinence   HISTORY OF PRESENT ILLNESS:  Renee Santos is a 74 year old female with a past medical history of anxiety, hypertension, psoriasis and colon polyps. S/P total hysterectomy and appendectomy in 1982.  She presents to our office today as referred by Dr. Billey Santos for further evaluation regarding fecal incontinence which occurs intermittently for the past 6 months.  She initially had urinary incontinence which occurred while hiking. She reports having fecal incontinence of loose stools which has occurred 3 to 4 times over the few months. Last Wed 6/16 she was driving, she felt the urge to pass a BM then soiled self in car. Today, she had fecal incontinence of a mud like stool. Stools are messy, takes a lot of wiping and doesn't feel emptied. No abdominal pain. No rectal bleeding or melena. Six months ago, she had constipation. No recent antibiotics. She drinks Coke Zero one can every other day. No coffee. No new medications. He husband has loose stools as well. No recent travel. They both traveled to [place] in 2019 without experiencing any GI illness/dirreha during their trip. She has increased her dietary fiber intake for the past 6 months. She is taking Benefiber daily. She is eating ctiva yogurt with berries on top. She is taking Benefiber 4 to 5 times weekly. Occasionally uses milk with cereal. No weight loss. Usually weighs 101 - 102 lbs. Her most recent colonoscopy was 12/2013 which showed sigmoid diverticulosis, no polyps. No significant change in stress level.   Colonoscopy 12/30/2013 by Dr. Lorayne Santos: Sigmoid diverticulosis No polyps Excellent prep Recall colonoscopy 10 years   Colonoscopy 08/30/2003: ? polyp sigmoid colon   Flexible sigmoidoscopy 12/06/1999: Diminutive polyp verses lipoma at 30cm.  Wt Readings from Last 3 Encounters:  08/03/19 101 lb 6 oz (46 kg)  06/17/19 102 lb 9.6 oz (46.5  kg)  05/31/19 101 lb (45.8 kg)     Past Medical History:  Diagnosis Date  . Cancer [facility]) 2011   Basal Cell of nose; Dr Santos  . Herpes zoster 2008   posterior thorax  . Hypertension   . Neurofibroma of back 2013    Dr Renee Santos   . Osteopenia 2016   T score -1.6 FRAX not calculated because of HRT  . Psoriasis    Dr Renee Santos  . Thyroid nodule    Past Surgical History:  Procedure Laterality Date  . ABDOMINAL HYSTERECTOMY      LSO  & appendectomy for endometrioma   @ age38 for ovarian cyst  . APPENDECTOMY    . cataract Bilateral Dec 2016;   not sure of date; wears readers only   . COLONOSCOPY  multiple  . OOPHORECTOMY     LSO for cyst  . TONSILLECTOMY      Social History: Nonsmoker. She drinks one or two glasses of wine weekly. No history of drug use.   Family History: Mother died 27 CVA and heart problems.  Father died age 66 from lung cancer.    No Known Allergies    Outpatient Encounter Medications as of 08/03/2019  Medication Sig  . BIOTIN FORTE PO Take 1 tablet by mouth.  . Calcium Carbonate-Vit D-Min (CALTRATE PLUS PO) Take by mouth daily.    . clobetasol cream (TEMOVATE) 1.82 % Apply 1 application topically 2 (two) times daily.  Marland Kitchen estradiol (ESTRACE) 0.5 MG tablet Take 1 tablet (0.5 mg total) by mouth daily.  Marland Kitchen  hydrochlorothiazide (HYDRODIURIL) 25 MG tablet Take 1/2 tablet by mouth daily  . levothyroxine (SYNTHROID) 75 MCG tablet Take 1 tablet (75 mcg total) by mouth daily.  Marland Kitchen LORazepam (ATIVAN) 0.5 MG tablet Take 1 tablet (0.5 mg total) by mouth 2 (two) times daily as needed. for anxiety  . metoprolol succinate (TOPROL-XL) 50 MG 24 hr tablet Take 1/2 tablet by mouth every day  . multivitamin-lutein (OCUVITE-LUTEIN) CAPS capsule Take 1 capsule by mouth daily.   No facility-administered encounter medications on file as of 08/03/2019.     REVIEW OF SYSTEMS: All other systems reviewed and negative except where noted in the History of Present  Illness.  PHYSICAL EXAM: BP 118/72   Pulse 70   Ht 5' 1.5" (1.562 m)   Wt 101 lb 6 oz (46 kg)   BMI 18.84 kg/m   General: Well developed  74 year old female in  no acute distress. Head: Normocephalic and atraumatic. Eyes:  Sclerae non-icteric, conjunctive pink. Ears: Normal auditory acuity. Mouth: Dentition intact. No ulcers or lesions.  Neck: Supple, no lymphadenopathy or thyromegaly.  Lungs: Clear bilaterally to auscultation without wheezes, crackles or rhonchi. Heart: Regular rate and rhythm. No murmur, rub or gallop appreciated.  Abdomen: Soft, nontender, non distended. No masses. No hepatosplenomegaly. Normoactive bowel sounds x 4 quadrants.  Rectal: No external hemorrhoids. Diminished anal sphincter tone. No mass.  Musculoskeletal: Symmetrical with no gross deformities. Skin: Warm and dry. No rash or lesions on visible extremities. Extremities: No edema. Neurological: Alert oriented x 4, no focal deficits.  Psychological:  Alert and cooperative. Normal mood and affect.  ASSESSMENT AND PLAN:  63. 74 year old female with episodic fecal incontinence and diarrhea  -Stop Benefiber x 1 week, if loose stools worsen off Benefiber then restart it in one week  -GI pathogen as loose stools have increased  -CBC, CMP, CRP, IgA and tTG -Ok to take Imodium 1/2 tab or Pepto bismal one tab prior to eating out  -Follow up in office in 6 weeks. Call office if symptoms worsen   2. Colon cancer screening. -Next colonoscopy due 12/2023     CC:  Renee Rail, MD   Agree with APP note  I am suspicious of problems with pelvic floor - if the work-up above is negative would have her do an anal manometry before I see her back  Renee Mayer, MD, [facility]

## 2019-08-03 ENCOUNTER — Ambulatory Visit: Payer: PPO | Admitting: Nurse Practitioner

## 2019-08-03 ENCOUNTER — Encounter: Payer: Self-pay | Admitting: Nurse Practitioner

## 2019-08-03 ENCOUNTER — Other Ambulatory Visit (INDEPENDENT_AMBULATORY_CARE_PROVIDER_SITE_OTHER): Payer: PPO

## 2019-08-03 VITALS — BP 118/72 | HR 70 | Ht 61.5 in | Wt 101.4 lb

## 2019-08-03 DIAGNOSIS — R159 Full incontinence of feces: Secondary | ICD-10-CM | POA: Insufficient documentation

## 2019-08-03 DIAGNOSIS — A09 Infectious gastroenteritis and colitis, unspecified: Secondary | ICD-10-CM | POA: Diagnosis not present

## 2019-08-03 LAB — CBC WITH DIFFERENTIAL/PLATELET
Basophils Absolute: 0.1 10*3/uL (ref 0.0–0.1)
Basophils Relative: 1.1 % (ref 0.0–3.0)
Eosinophils Absolute: 0 10*3/uL (ref 0.0–0.7)
Eosinophils Relative: 0.9 % (ref 0.0–5.0)
HCT: 39.8 % (ref 36.0–46.0)
Hemoglobin: 13.7 g/dL (ref 12.0–15.0)
Lymphocytes Relative: 41.1 % (ref 12.0–46.0)
Lymphs Abs: 2 10*3/uL (ref 0.7–4.0)
MCHC: 34.5 g/dL (ref 30.0–36.0)
MCV: 97.2 fl (ref 78.0–100.0)
Monocytes Absolute: 0.5 10*3/uL (ref 0.1–1.0)
Monocytes Relative: 10.6 % (ref 3.0–12.0)
Neutro Abs: 2.2 10*3/uL (ref 1.4–7.7)
Neutrophils Relative %: 46.3 % (ref 43.0–77.0)
Platelets: 186 10*3/uL (ref 150.0–400.0)
RBC: 4.09 Mil/uL (ref 3.87–5.11)
RDW: 11.9 % (ref 11.5–15.5)
WBC: 4.8 10*3/uL (ref 4.0–10.5)

## 2019-08-03 LAB — COMPREHENSIVE METABOLIC PANEL
ALT: 15 U/L (ref 0–35)
AST: 19 U/L (ref 0–37)
Albumin: 4.2 g/dL (ref 3.5–5.2)
Alkaline Phosphatase: 49 U/L (ref 39–117)
BUN: 22 mg/dL (ref 6–23)
CO2: 32 mEq/L (ref 19–32)
Calcium: 10.1 mg/dL (ref 8.4–10.5)
Chloride: 103 mEq/L (ref 96–112)
Creatinine, Ser: 1.06 mg/dL (ref 0.40–1.20)
GFR: 50.92 mL/min — ABNORMAL LOW (ref >60.00)
Glucose, Bld: 92 mg/dL (ref 70–99)
Potassium: 4.3 mEq/L (ref 3.5–5.1)
Sodium: 138 mEq/L (ref 135–145)
Total Bilirubin: 0.5 mg/dL (ref 0.2–1.2)
Total Protein: 6.9 g/dL (ref 6.0–8.3)

## 2019-08-03 LAB — C-REACTIVE PROTEIN: CRP: <1 mg/dL (ref 0.5–20.0)

## 2019-08-03 LAB — IGA: IgA: 319 mg/dL (ref 68–378)

## 2019-08-03 NOTE — Patient Instructions (Signed)
If you are age 72 or older, your body mass index should be between 23-30. Your Body mass index is 18.84 kg/m. If this is out of the aforementioned range listed, please consider follow up with your Primary Care Provider.  If you are age 79 or younger, your body mass index should be between 19-25. Your Body mass index is 18.84 kg/m. If this is out of the aformentioned range listed, please consider follow up with your Primary Care Provider.   Your provider has requested that you go to the basement level for lab work before leaving today. Press "B" on the elevator. The lab is located at the first door on the left as you exit the elevator. _____________________________________________________________  1. Stop Benefiber for 1 week. 2. If your loose stools worsen off of the Benefiber, take Pepto bismal tablet and 1/2 an Imodium as needed before eating. 3. Call the office if your symptoms worsen. [phone]  Follow up in 6 weeks with Dr Carlean Purl  Due to recent changes in healthcare laws, you may see the results of your imaging and laboratory studies on MyChart before your provider has had a chance to review them.  We understand that in some cases there may be results that are confusing or concerning to you. Not all laboratory results come back in the same time frame and the provider may be waiting for multiple results in order to interpret others.  Please give [place] 48 hours in order for your provider to thoroughly review all the results before contacting the office for clarification of your results.   Thank you for choosing [facility] Noralyn Pick, CRNP

## 2019-08-04 DIAGNOSIS — M542 Cervicalgia: Secondary | ICD-10-CM | POA: Diagnosis not present

## 2019-08-04 LAB — TISSUE TRANSGLUTAMINASE, IGA: (tTG) Ab, IgA: 1 U/mL

## 2019-08-12 ENCOUNTER — Telehealth: Payer: Self-pay | Admitting: Nurse Practitioner

## 2019-08-12 NOTE — Telephone Encounter (Signed)
I called the patient and left a detailed message on her personal voicemail reguarding her office visit 6/22, her blood tests were normal and a GI pathogen panel has not yet been received.  I advised the patient if she is still having loose stools with incontinence to complete the GI pathogen test and to undergo physical therapy to strengthen the pelvic floor muscles/  I also mention Dr. Celesta Aver recommendations for anal manometry if her symptoms persisted.  I asked the patient to call me back so we can further discuss a pelvic physical therapy arrangements and potential anal manometry before she follows up in the office with Dr. Carlean Purl in August.

## 2019-08-18 DIAGNOSIS — M542 Cervicalgia: Secondary | ICD-10-CM | POA: Diagnosis not present

## 2019-08-20 ENCOUNTER — Other Ambulatory Visit: Payer: PPO

## 2019-08-20 DIAGNOSIS — R159 Full incontinence of feces: Secondary | ICD-10-CM | POA: Diagnosis not present

## 2019-08-20 DIAGNOSIS — A09 Infectious gastroenteritis and colitis, unspecified: Secondary | ICD-10-CM | POA: Diagnosis not present

## 2019-08-24 LAB — GI PROFILE, STOOL, PCR

## 2019-09-04 DIAGNOSIS — H6123 Impacted cerumen, bilateral: Secondary | ICD-10-CM | POA: Diagnosis not present

## 2019-09-08 DIAGNOSIS — M542 Cervicalgia: Secondary | ICD-10-CM | POA: Diagnosis not present

## 2019-09-21 ENCOUNTER — Ambulatory Visit: Payer: PPO | Admitting: Internal Medicine

## 2019-09-27 DIAGNOSIS — M542 Cervicalgia: Secondary | ICD-10-CM | POA: Diagnosis not present

## 2019-09-28 ENCOUNTER — Encounter: Payer: Self-pay | Admitting: Internal Medicine

## 2019-09-28 NOTE — Telephone Encounter (Signed)
Called pt and this is regarding lab work GI office did and advised pt to call [facility]

## 2019-10-11 DIAGNOSIS — L821 Other seborrheic keratosis: Secondary | ICD-10-CM | POA: Diagnosis not present

## 2019-10-11 DIAGNOSIS — D2261 Melanocytic nevi of right upper limb, including shoulder: Secondary | ICD-10-CM | POA: Diagnosis not present

## 2019-10-11 DIAGNOSIS — L400 Psoriasis vulgaris: Secondary | ICD-10-CM | POA: Diagnosis not present

## 2019-10-11 DIAGNOSIS — D225 Melanocytic nevi of trunk: Secondary | ICD-10-CM | POA: Diagnosis not present

## 2019-10-11 DIAGNOSIS — D1801 Hemangioma of skin and subcutaneous tissue: Secondary | ICD-10-CM | POA: Diagnosis not present

## 2019-10-11 DIAGNOSIS — L720 Epidermal cyst: Secondary | ICD-10-CM | POA: Diagnosis not present

## 2019-10-20 DIAGNOSIS — M542 Cervicalgia: Secondary | ICD-10-CM | POA: Diagnosis not present

## 2019-11-01 ENCOUNTER — Encounter: Payer: Self-pay | Admitting: Internal Medicine

## 2019-11-10 ENCOUNTER — Ambulatory Visit: Payer: PPO

## 2019-11-10 DIAGNOSIS — M542 Cervicalgia: Secondary | ICD-10-CM | POA: Diagnosis not present

## 2019-12-01 DIAGNOSIS — M542 Cervicalgia: Secondary | ICD-10-CM | POA: Diagnosis not present

## 2019-12-22 DIAGNOSIS — M542 Cervicalgia: Secondary | ICD-10-CM | POA: Diagnosis not present

## 2019-12-27 ENCOUNTER — Encounter: Payer: Self-pay | Admitting: Internal Medicine

## 2019-12-28 MED ORDER — LORAZEPAM 0.5 MG PO TABS: 0.5000 mg | ORAL_TABLET | Freq: Two times a day (BID) | ORAL | 1 refills | Status: DC | PRN

## 2020-01-12 DIAGNOSIS — M542 Cervicalgia: Secondary | ICD-10-CM | POA: Diagnosis not present

## 2020-01-12 DIAGNOSIS — L720 Epidermal cyst: Secondary | ICD-10-CM | POA: Diagnosis not present

## 2020-01-12 DIAGNOSIS — H26491 Other secondary cataract, right eye: Secondary | ICD-10-CM | POA: Diagnosis not present

## 2020-01-12 DIAGNOSIS — Z961 Presence of intraocular lens: Secondary | ICD-10-CM | POA: Diagnosis not present

## 2020-01-12 DIAGNOSIS — H04123 Dry eye syndrome of bilateral lacrimal glands: Secondary | ICD-10-CM | POA: Diagnosis not present

## 2020-02-12 ENCOUNTER — Encounter: Payer: Self-pay | Admitting: Internal Medicine

## 2020-02-12 LAB — HM MAMMOGRAPHY

## 2020-02-15 ENCOUNTER — Telehealth: Payer: Self-pay

## 2020-02-15 ENCOUNTER — Other Ambulatory Visit: Payer: Self-pay

## 2020-02-16 ENCOUNTER — Encounter: Payer: Self-pay | Admitting: Internal Medicine

## 2020-02-17 DIAGNOSIS — Z20822 Contact with and (suspected) exposure to covid-19: Secondary | ICD-10-CM | POA: Diagnosis not present

## 2020-02-18 NOTE — Telephone Encounter (Signed)
Faxed today

## 2020-02-23 DIAGNOSIS — M8589 Other specified disorders of bone density and structure, multiple sites: Secondary | ICD-10-CM | POA: Diagnosis not present

## 2020-02-23 DIAGNOSIS — Z1231 Encounter for screening mammogram for malignant neoplasm of breast: Secondary | ICD-10-CM | POA: Diagnosis not present

## 2020-02-23 LAB — HM DEXA SCAN

## 2020-02-25 DIAGNOSIS — M542 Cervicalgia: Secondary | ICD-10-CM | POA: Diagnosis not present

## 2020-03-02 ENCOUNTER — Encounter: Payer: Self-pay | Admitting: Internal Medicine

## 2020-03-06 NOTE — Progress Notes (Signed)
Subjective:    Patient ID: Renee Santos, female    DOB: 09/16/1945, 75 y.o.   MRN: [id number]  HPI The patient is here for follow up of of her osteoporosis ( osteopenia w/ high frax).  Dexa 02/23/20:   RFN  -1.3,  LFN  -1.3,  Spine -1.4  frax  14%, 4.5%  She is walking daily.  She walks several miles a day.    She takes calcium and vitamin d daily, MVI.  On Estradiol.    She needs refills of her chronic meds.  She denies concerns.    Medications and allergies reviewed with patient and updated if appropriate.  Patient Active Problem List   Diagnosis Date Noted  . Fecal incontinence 08/03/2019  . Paronychia of great toe, left 10/08/2016  . Hyperglycemia 07/16/2016  . Dizziness 07/16/2016  . Lightheadedness 07/16/2016  . Calcific tendonitis of right hand 05/01/2015  . Herpes simplex 05/01/2015  . Situational anxiety 05/01/2015  . Essential hypertension 03/01/2008  . Osteopenia 03/01/2008  . THYROID NODULE 01/13/2007  . Hypothyroidism 01/13/2007  . HYPERLIPIDEMIA 01/13/2007  . PSORIASIS 01/13/2007    Current Outpatient Medications on File Prior to Visit  Medication Sig Dispense Refill  . BIOTIN FORTE PO Take 1 tablet by mouth.    . Calcium Carbonate-Vit D-Min (CALTRATE PLUS PO) Take by mouth daily.      . clobetasol cream (TEMOVATE) 5.68 % Apply 1 application topically 2 (two) times daily. 45 g 1  . estradiol (ESTRACE) 0.5 MG tablet Take 1 tablet (0.5 mg total) by mouth daily. 90 tablet 4  . FLUZONE HIGH-DOSE QUADRIVALENT 0.7 ML SUSY     . multivitamin-lutein (OCUVITE-LUTEIN) CAPS capsule Take 1 capsule by mouth daily.     No current facility-administered medications on file prior to visit.    Past Medical History:  Diagnosis Date  . Cancer [facility]) 2011   Basal Cell of nose; Dr Martinique  . Herpes zoster 2008   posterior thorax  . Hypertension   . Neurofibroma of back 2013    Dr Amy Martinique   . Osteopenia 2016   T score -1.6 FRAX not calculated because of HRT  .  Psoriasis    Dr Amy Martinique  . Thyroid nodule     Past Surgical History:  Procedure Laterality Date  . ABDOMINAL HYSTERECTOMY      LSO  & appendectomy for endometrioma   @ age38 for ovarian cyst  . APPENDECTOMY    . cataract Bilateral Dec 2016;   not sure of date; wears readers only   . COLONOSCOPY  multiple  . OOPHORECTOMY     LSO for cyst  . TONSILLECTOMY      Social History   Socioeconomic History  . Marital status: Married    Spouse name: Not on file  . Number of children: 2  . Years of education: Not on file  . Highest education level: Not on file  Occupational History  . Not on file  Tobacco Use  . Smoking status: Former Research scientist (life sciences)  . Smokeless tobacco: Never Used  . Tobacco comment: college only socially  Vaping Use  . Vaping Use: Never used  Substance and Sexual Activity  . Alcohol use: Yes    Alcohol/week: 3.0 standard drinks    Types: 3 Glasses of wine per week  . Drug use: No  . Sexual activity: Yes    Birth control/protection: Surgical    Comment: HYST-1st intercourse 75 yo-Fewer than 5 partners  Other  Topics Concern  . Not on file  Social History Narrative   Lives with husband x 45 years in a two story home.  Has 2 children.     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     Education: college.   Social Determinants of Health   Financial Resource Strain: Not on file  Food Insecurity: Not on file  Transportation Needs: Not on file  Physical Activity: Not on file  Stress: Not on file  Social Connections: Not on file    Family History  Problem Relation Age of Onset  . Hypertension Father   . Lung cancer Father        remote smoker, deceased 25  . Hyperlipidemia Mother        CABG in late 36s  . Hypothyroidism Mother   . Macular degeneration Mother   . Hypertension Mother   . Coronary artery disease Mother        bypass surgery  . Stroke Mother 1       Deceased  . Coronary artery disease Maternal Grandmother   . Asthma Maternal Uncle        2  .  Asthma Maternal Grandfather   . Diabetes Neg Hx   . Heart attack Neg Hx   . Colon cancer Neg Hx     Review of Systems  Constitutional: Negative for chills and fever.  Respiratory: Negative for cough, shortness of breath and wheezing.   Cardiovascular: Negative for chest pain, palpitations and leg swelling.  Neurological: Negative for light-headedness and headaches.       Objective:   Vitals:   03/07/20 1046  BP: 120/70  Pulse: 80  Temp: 98.3 F (36.8 C)  SpO2: 98%   BP Readings from Last 3 Encounters:  03/07/20 120/70  08/03/19 118/72  06/17/19 102/70   Wt Readings from Last 3 Encounters:  03/07/20 102 lb 9.6 oz (46.5 kg)  08/03/19 101 lb 6 oz (46 kg)  06/17/19 102 lb 9.6 oz (46.5 kg)   Body mass index is 19.07 kg/m.   Physical Exam    Constitutional: Appears well-developed and well-nourished. No distress.  Head: Normocephalic and atraumatic.  Neck: Neck supple. No tracheal deviation present. No thyromegaly present.  No cervical lymphadenopathy Cardiovascular: Normal rate, regular rhythm and normal heart sounds.  No murmur heard. No carotid bruit .  No edema Pulmonary/Chest: Effort normal and breath sounds normal. No respiratory distress. No has no wheezes. No rales.  Skin: Skin is warm and dry. Not diaphoretic.  Psychiatric: Normal mood and affect. Behavior is normal.       Assessment & Plan:    25 minutes were spent face-to-face with the patient, over 50% of which was spent counseling regarding her severe osteopenia with a high FRAX, including causes of decreased bone density and risk of fracture.  We discussed treatment with calcium/vitamin d, exercise and medication options including fosamax, prolia, reclast and forteo.  Each medication was discussed including the possible side effects.   See Problem List for Assessment and Plan of chronic medical problems.    This visit occurred during the SARS-CoV-2 public health emergency.  Safety protocols were in  place, including screening questions prior to the visit, additional usage of staff PPE, and extensive cleaning of exam room while observing appropriate contact time as indicated for disinfecting solutions.

## 2020-03-07 ENCOUNTER — Encounter: Payer: Self-pay | Admitting: Internal Medicine

## 2020-03-07 ENCOUNTER — Other Ambulatory Visit: Payer: Self-pay

## 2020-03-07 ENCOUNTER — Ambulatory Visit (INDEPENDENT_AMBULATORY_CARE_PROVIDER_SITE_OTHER): Payer: PPO | Admitting: Internal Medicine

## 2020-03-07 VITALS — BP 120/70 | HR 80 | Temp 98.3°F | Ht 61.5 in | Wt 102.6 lb

## 2020-03-07 DIAGNOSIS — E038 Other specified hypothyroidism: Secondary | ICD-10-CM | POA: Diagnosis not present

## 2020-03-07 DIAGNOSIS — F418 Other specified anxiety disorders: Secondary | ICD-10-CM

## 2020-03-07 DIAGNOSIS — I10 Essential (primary) hypertension: Secondary | ICD-10-CM | POA: Diagnosis not present

## 2020-03-07 DIAGNOSIS — R159 Full incontinence of feces: Secondary | ICD-10-CM | POA: Diagnosis not present

## 2020-03-07 DIAGNOSIS — M8580 Other specified disorders of bone density and structure, unspecified site: Secondary | ICD-10-CM

## 2020-03-07 LAB — COMPREHENSIVE METABOLIC PANEL
ALT: 19 U/L (ref 0–35)
AST: 24 U/L (ref 0–37)
Albumin: 4.1 g/dL (ref 3.5–5.2)
Alkaline Phosphatase: 53 U/L (ref 39–117)
BUN: 21 mg/dL (ref 6–23)
CO2: 29 mEq/L (ref 19–32)
Calcium: 9.2 mg/dL (ref 8.4–10.5)
Chloride: 102 mEq/L (ref 96–112)
Creatinine, Ser: 1.04 mg/dL (ref 0.40–1.20)
GFR: 53.57 mL/min — ABNORMAL LOW (ref >60.00)
Glucose, Bld: 83 mg/dL (ref 70–99)
Potassium: 3.9 mEq/L (ref 3.5–5.1)
Sodium: 137 mEq/L (ref 135–145)
Total Bilirubin: 0.6 mg/dL (ref 0.2–1.2)
Total Protein: 7.1 g/dL (ref 6.0–8.3)

## 2020-03-07 LAB — VITAMIN D 25 HYDROXY (VIT D DEFICIENCY, FRACTURES): VITD: 57.51 ng/mL (ref 30.00–100.00)

## 2020-03-07 LAB — TSH: TSH: 0.26 u[IU]/mL — ABNORMAL LOW (ref 0.35–4.50)

## 2020-03-07 MED ORDER — LEVOTHYROXINE SODIUM 75 MCG PO TABS: 75.0000 ug | ORAL_TABLET | Freq: Every day | ORAL | 3 refills | Status: DC

## 2020-03-07 MED ORDER — HYDROCHLOROTHIAZIDE 25 MG PO TABS: ORAL_TABLET | ORAL | 3 refills | Status: DC

## 2020-03-07 MED ORDER — LORAZEPAM 0.5 MG PO TABS: 0.5000 mg | ORAL_TABLET | Freq: Two times a day (BID) | ORAL | 1 refills | Status: DC | PRN

## 2020-03-07 MED ORDER — METOPROLOL SUCCINATE ER 50 MG PO TB24: ORAL_TABLET | ORAL | 3 refills | Status: DC

## 2020-03-07 NOTE — Patient Instructions (Signed)
  Blood work was ordered.     Medications changes include :   none  Your prescription(s) have been submitted to your pharmacy. Please take as directed and contact our office if you believe you are having problem(s) with the medication(s).      Please followup in 1 year  

## 2020-03-07 NOTE — Assessment & Plan Note (Signed)
Chronic  Clinically euthyroid Currently taking levothyroxine 75 mcg daily Check tsh  Titrate med dose if needed  

## 2020-03-07 NOTE — Assessment & Plan Note (Signed)
Chronic BP well controlled Continue hctz 12. 5 mg daily, metoprolol 25 mg daily cmp

## 2020-03-07 NOTE — Assessment & Plan Note (Signed)
Chronic Intermittent Variable Still a problem Discussed pelvic PT Adjusting diet to keep stool formed but soft

## 2020-03-07 NOTE — Assessment & Plan Note (Signed)
Chronic Situational Taking lorazepam as needed, which works well and is well tolerated - no side effects Continue lorazepam 0.5 mg daily prn

## 2020-03-07 NOTE — Assessment & Plan Note (Signed)
Chronic Reviewed dexa Walking daily Taking calcium and vitamin d daily Check vitamin d daily On estradiol - not sure if she is coming off anytime soon - this makes the frax calculation invalid Does not need medication at this time Continue above

## 2020-03-08 ENCOUNTER — Other Ambulatory Visit: Payer: Self-pay | Admitting: Internal Medicine

## 2020-03-08 MED ORDER — LEVOTHYROXINE SODIUM 75 MCG PO TABS: 75.0000 ug | ORAL_TABLET | Freq: Every day | ORAL | 3 refills | Status: DC

## 2020-03-09 ENCOUNTER — Encounter: Payer: Self-pay | Admitting: Internal Medicine

## 2020-03-10 ENCOUNTER — Encounter: Payer: Self-pay | Admitting: Internal Medicine

## 2020-03-10 MED ORDER — LEVOTHYROXINE SODIUM 75 MCG PO TABS: 75.0000 ug | ORAL_TABLET | Freq: Every day | ORAL | 3 refills | Status: DC

## 2020-03-10 NOTE — Progress Notes (Signed)
Outside notes received. Information abstracted. Notes sent to scan.  

## 2020-03-13 ENCOUNTER — Telehealth: Payer: Self-pay | Admitting: Internal Medicine

## 2020-03-13 ENCOUNTER — Encounter: Payer: Self-pay | Admitting: Internal Medicine

## 2020-03-13 NOTE — Telephone Encounter (Signed)
   Pharmacy requesting clarification on instructions for levothyroxine (SYNTHROID) 75 MCG tablet They are questioning the "6 days a week"  Please call

## 2020-03-14 ENCOUNTER — Telehealth: Payer: Self-pay | Admitting: Internal Medicine

## 2020-03-14 NOTE — Telephone Encounter (Signed)
levothyroxine (SYNTHROID) 75 MCG tablet Pharmacy calling trying to clarify instructions for the medication. They received one that says take one tablet daily, and another that says take one tablet daily six days a week. If it is the 6 days a week what day of the week dont they take it? Stated we can either call back with instructions or send in a new script with the new instructions and also include to disregard prior scripts for this medication. Phone # [phone] Reference# [id number]

## 2020-03-14 NOTE — Telephone Encounter (Signed)
Should be taking thyroid medication 6 days a week.  It does not matter which day she does not take it.

## 2020-03-15 NOTE — Telephone Encounter (Signed)
Called and spoke with Clarise Cruz from [place].  Instructions verified with her today.

## 2020-04-05 DIAGNOSIS — M542 Cervicalgia: Secondary | ICD-10-CM | POA: Diagnosis not present

## 2020-04-07 ENCOUNTER — Encounter: Payer: Self-pay | Admitting: Internal Medicine

## 2020-04-07 DIAGNOSIS — E039 Hypothyroidism, unspecified: Secondary | ICD-10-CM

## 2020-04-24 DIAGNOSIS — M542 Cervicalgia: Secondary | ICD-10-CM | POA: Diagnosis not present

## 2020-05-08 DIAGNOSIS — M542 Cervicalgia: Secondary | ICD-10-CM | POA: Diagnosis not present

## 2020-06-01 ENCOUNTER — Encounter: Payer: PPO | Admitting: Obstetrics and Gynecology

## 2020-06-06 ENCOUNTER — Ambulatory Visit: Payer: PPO | Admitting: Nurse Practitioner

## 2020-06-07 ENCOUNTER — Encounter: Payer: Self-pay | Admitting: Nurse Practitioner

## 2020-06-07 ENCOUNTER — Other Ambulatory Visit: Payer: Self-pay

## 2020-06-07 ENCOUNTER — Ambulatory Visit (INDEPENDENT_AMBULATORY_CARE_PROVIDER_SITE_OTHER): Payer: PPO | Admitting: Nurse Practitioner

## 2020-06-07 VITALS — BP 118/78 | Ht 61.0 in | Wt 104.0 lb

## 2020-06-07 DIAGNOSIS — M85851 Other specified disorders of bone density and structure, right thigh: Secondary | ICD-10-CM | POA: Diagnosis not present

## 2020-06-07 DIAGNOSIS — Z79890 Hormone replacement therapy (postmenopausal): Secondary | ICD-10-CM | POA: Diagnosis not present

## 2020-06-07 DIAGNOSIS — Z01419 Encounter for gynecological examination (general) (routine) without abnormal findings: Secondary | ICD-10-CM | POA: Diagnosis not present

## 2020-06-07 DIAGNOSIS — Z780 Asymptomatic menopausal state: Secondary | ICD-10-CM

## 2020-06-07 DIAGNOSIS — M85852 Other specified disorders of bone density and structure, left thigh: Secondary | ICD-10-CM | POA: Diagnosis not present

## 2020-06-07 MED ORDER — ESTRADIOL 0.5 MG PO TABS: 0.5000 mg | ORAL_TABLET | Freq: Every day | ORAL | 1 refills | Status: DC

## 2020-06-07 NOTE — Progress Notes (Signed)
Teegan Guinther Agro August 18, 1945 [id number]   History:  75 y.o. G2P2 presents for breast and pelvic exam without GYN complaints. TAH BSO at age 17 for endometrioma with LSO for cyst. On ERT, has tried to wean in the past and was unable to tolerate but it has been years since she has tried. Normal pap and mammogram history. Mild osteopenia of bilateral femoral necks. Hypothyroidism managed by PCP. Walks 8 miles a day.   Gynecologic History No LMP recorded. Patient has had a hysterectomy.   Contraception: status post hysterectomy  Health Maintenance Last Pap: No longer screening per guidelines Last mammogram: 02/2020. Results were: normal Last colonoscopy: 2015. Results were: norma,l, 10-year recall Last Dexa: 02/23/2020. Results were: T-score -1.4  Past medical history, past surgical history, family history and social history were all reviewed and documented in the EPIC chart. Married. 2 children, 4 grandchildren.   ROS:  A ROS was performed and pertinent positives and negatives are included.  Exam:  Vitals:   06/07/20 0803  BP: 118/78  Weight: 104 lb (47.2 kg)  Height: 5\' 1"  (1.549 m)   Body mass index is 19.65 kg/m.  General appearance:  Normal Thyroid:  Symmetrical, normal in size, without palpable masses or nodularity. Respiratory  Auscultation:  Clear without wheezing or rhonchi Cardiovascular  Auscultation:  Regular rate, without rubs, murmurs or gallops  Edema/varicosities:  Not grossly evident Abdominal  Soft,nontender, without masses, guarding or rebound.  Liver/spleen:  No organomegaly noted  Hernia:  None appreciated  Skin  Inspection:  Grossly normal Breasts: Examined lying and sitting.   Right: Without masses, retractions, nipple discharge or axillary adenopathy.   Left: Without masses, retractions, nipple discharge or axillary adenopathy. Gentitourinary   Inguinal/mons:  Normal without inguinal adenopathy  External genitalia:  Normal appearing vulva with no  masses, tenderness, or lesions  BUS/Urethra/Skene's glands:  Normal  Vagina:  Normal appearing with normal color and discharge, no lesions. Atrophic changes.  Cervix:  Absent  Uterus:  Absent  Adnexa/parametria:     Rt: Normal in size, without masses or tenderness.   Lt: Normal in size, without masses or tenderness.  Anus and perineum: Normal  Digital rectal exam: Normal sphincter tone without palpated masses or tenderness  Assessment/Plan:  75 y.o. G2P2 for breast and pelvic exam.   Well female exam with routine gynecological exam - Education provided on SBEs, importance of preventative screenings, current guidelines, high calcium diet, regular exercise, and multivitamin daily. Labs with PCP.   Postmenopausal - on ERT  Hormone replacement therapy (HRT) - Plan: estradiol (ESTRACE) 0.5 MG tablet. We discussed the risk for continued use to include blood clots, heart attack, stroke, and breast cancer. She has not tried to wean in years and would like to try. She will take 1/2 tablet daily x 2 week, if tolerating she will do 1/2 tablet every other day x 2 weeks and then stop.   Osteopenia of necks of both femurs - January 2022 T-score 1.4. She walks 8 miles per day at the park with friends and with walking her dog. Takes daily Vitamin D supplement.   Screening for cervical cancer - Normal Pap history.  No longer screening per guidelines.   Screening for breast cancer - Normal mammogram history.  Continue annual screenings.  Normal breast exam today.  Screening for colon cancer - 2015 colonoscopy. Will repeat at GI's recommended interval.   Return in 1 year for annual.    Tamela Gammon DNP, 8:19 AM 06/07/2020

## 2020-06-07 NOTE — Patient Instructions (Signed)
Health Maintenance After Age 73 After age 73, you are at a higher risk for certain long-term diseases and infections as well as injuries from falls. Falls are a major cause of broken bones and head injuries in people who are older than age 73. Getting regular preventive care can help to keep you healthy and well. Preventive care includes getting regular testing and making lifestyle changes as recommended by your health care provider. Talk with your health care provider about:  Which screenings and tests you should have. A screening is a test that checks for a disease when you have no symptoms.  A diet and exercise plan that is right for you. What should I know about screenings and tests to prevent falls? Screening and testing are the best ways to find a health problem early. Early diagnosis and treatment give you the best chance of managing medical conditions that are common after age 73. Certain conditions and lifestyle choices may make you more likely to have a fall. Your health care provider may recommend:  Regular vision checks. Poor vision and conditions such as cataracts can make you more likely to have a fall. If you wear glasses, make sure to get your prescription updated if your vision changes.  Medicine review. Work with your health care provider to regularly review all of the medicines you are taking, including over-the-counter medicines. Ask your health care provider about any side effects that may make you more likely to have a fall. Tell your health care provider if any medicines that you take make you feel dizzy or sleepy.  Osteoporosis screening. Osteoporosis is a condition that causes the bones to get weaker. This can make the bones weak and cause them to break more easily.  Blood pressure screening. Blood pressure changes and medicines to control blood pressure can make you feel dizzy.  Strength and balance checks. Your health care provider may recommend certain tests to check your  strength and balance while standing, walking, or changing positions.  Foot health exam. Foot pain and numbness, as well as not wearing proper footwear, can make you more likely to have a fall.  Depression screening. You may be more likely to have a fall if you have a fear of falling, feel emotionally low, or feel unable to do activities that you used to do.  Alcohol use screening. Using too much alcohol can affect your balance and may make you more likely to have a fall. What actions can I take to lower my risk of falls? General instructions  Talk with your health care provider about your risks for falling. Tell your health care provider if: ? You fall. Be sure to tell your health care provider about all falls, even ones that seem minor. ? You feel dizzy, sleepy, or off-balance.  Take over-the-counter and prescription medicines only as told by your health care provider. These include any supplements.  Eat a healthy diet and maintain a healthy weight. A healthy diet includes low-fat dairy products, low-fat (lean) meats, and fiber from whole grains, beans, and lots of fruits and vegetables. Home safety  Remove any tripping hazards, such as rugs, cords, and clutter.  Install safety equipment such as grab bars in bathrooms and safety rails on stairs.  Keep rooms and walkways well-lit. Activity  Follow a regular exercise program to stay fit. This will help you maintain your balance. Ask your health care provider what types of exercise are appropriate for you.  If you need a cane or walker,   use it as recommended by your health care provider.  Wear supportive shoes that have nonskid soles.   Lifestyle  Do not drink alcohol if your health care provider tells you not to drink.  If you drink alcohol, limit how much you have: ? 0-1 drink a day for women. ? 0-2 drinks a day for men.  Be aware of how much alcohol is in your drink. In the [place] one drink equals one typical bottle of beer (12  oz), one-half glass of wine (5 oz), or one shot of hard liquor (1 oz).  Do not use any products that contain nicotine or tobacco, such as cigarettes and e-cigarettes. If you need help quitting, ask your health care provider. Summary  Having a healthy lifestyle and getting preventive care can help to protect your health and wellness after age 73.  Screening and testing are the best way to find a health problem early and help you avoid having a fall. Early diagnosis and treatment give you the best chance for managing medical conditions that are more common for people who are older than age 73.  Falls are a major cause of broken bones and head injuries in people who are older than age 73. Take precautions to prevent a fall at home.  Work with your health care provider to learn what changes you can make to improve your health and wellness and to prevent falls. This information is not intended to replace advice given to you by your health care provider. Make sure you discuss any questions you have with your health care provider. Document Revised: 05/21/2018 Document Reviewed: 12/11/2016 Elsevier Patient Education  2021 Elsevier Inc.  

## 2020-06-12 DIAGNOSIS — M542 Cervicalgia: Secondary | ICD-10-CM | POA: Diagnosis not present

## 2020-07-03 DIAGNOSIS — M542 Cervicalgia: Secondary | ICD-10-CM | POA: Diagnosis not present

## 2020-08-01 DIAGNOSIS — L603 Nail dystrophy: Secondary | ICD-10-CM | POA: Diagnosis not present

## 2020-08-01 DIAGNOSIS — L570 Actinic keratosis: Secondary | ICD-10-CM | POA: Diagnosis not present

## 2020-08-10 ENCOUNTER — Telehealth: Payer: Self-pay | Admitting: Internal Medicine

## 2020-08-10 NOTE — Chronic Care Management (AMB) (Signed)
  Chronic Care Management   Note  08/10/2020 Name: Renee Santos MRN: [id number] DOB: 1945/10/12  Renee Santos is a 75 y.o. year old female who is a primary care patient of Burns, Claudina Lick, MD. I reached out to Renee Santos by phone today in response to a referral sent by Renee Santos's PCP, Binnie Rail, MD.   Renee Santos was given information about Chronic Care Management services today including:  CCM service includes personalized support from designated clinical staff supervised by her physician, including individualized plan of care and coordination with other care providers 24/7 contact phone numbers for assistance for urgent and routine care needs. Service will only be billed when office clinical staff spend 20 minutes or more in a month to coordinate care. Only one practitioner may furnish and bill the service in a calendar month. The patient may stop CCM services at any time (effective at the end of the month) by phone call to the office staff.   Patient agreed to services and verbal consent obtained.   Follow up plan:   Renee Santos Upstream Scheduler

## 2020-08-30 ENCOUNTER — Telehealth: Payer: Self-pay | Admitting: Internal Medicine

## 2020-08-30 DIAGNOSIS — M542 Cervicalgia: Secondary | ICD-10-CM | POA: Diagnosis not present

## 2020-08-30 NOTE — Telephone Encounter (Signed)
LVM for pt to rtn my call to r/s appt with [facility] on 08/30/20. Please r/s appt if pt calls or comes to the office.

## 2020-08-31 ENCOUNTER — Ambulatory Visit: Payer: PPO

## 2020-09-13 ENCOUNTER — Other Ambulatory Visit: Payer: Self-pay

## 2020-09-13 ENCOUNTER — Emergency Department (HOSPITAL_BASED_OUTPATIENT_CLINIC_OR_DEPARTMENT_OTHER)
Admission: EM | Admit: 2020-09-13 | Discharge: 2020-09-13 | Disposition: A | Discharge status: Home / Self Care | Payer: PPO | Attending: Emergency Medicine | Admitting: Emergency Medicine

## 2020-09-13 ENCOUNTER — Ambulatory Visit (INDEPENDENT_AMBULATORY_CARE_PROVIDER_SITE_OTHER): Payer: PPO

## 2020-09-13 ENCOUNTER — Encounter (HOSPITAL_BASED_OUTPATIENT_CLINIC_OR_DEPARTMENT_OTHER): Payer: Self-pay | Admitting: Obstetrics and Gynecology

## 2020-09-13 ENCOUNTER — Emergency Department (HOSPITAL_BASED_OUTPATIENT_CLINIC_OR_DEPARTMENT_OTHER): Payer: PPO | Admitting: Radiology

## 2020-09-13 VITALS — BP 120/70 | HR 70 | Temp 97.6°F | Ht 61.0 in | Wt 102.6 lb

## 2020-09-13 DIAGNOSIS — Z85828 Personal history of other malignant neoplasm of skin: Secondary | ICD-10-CM | POA: Diagnosis not present

## 2020-09-13 DIAGNOSIS — I10 Essential (primary) hypertension: Secondary | ICD-10-CM | POA: Insufficient documentation

## 2020-09-13 DIAGNOSIS — R6884 Jaw pain: Secondary | ICD-10-CM | POA: Insufficient documentation

## 2020-09-13 DIAGNOSIS — R079 Chest pain, unspecified: Secondary | ICD-10-CM

## 2020-09-13 DIAGNOSIS — Z79899 Other long term (current) drug therapy: Secondary | ICD-10-CM | POA: Diagnosis not present

## 2020-09-13 DIAGNOSIS — R0789 Other chest pain: Secondary | ICD-10-CM | POA: Insufficient documentation

## 2020-09-13 DIAGNOSIS — J449 Chronic obstructive pulmonary disease, unspecified: Secondary | ICD-10-CM | POA: Diagnosis not present

## 2020-09-13 DIAGNOSIS — Z87891 Personal history of nicotine dependence: Secondary | ICD-10-CM | POA: Diagnosis not present

## 2020-09-13 DIAGNOSIS — E039 Hypothyroidism, unspecified: Secondary | ICD-10-CM | POA: Insufficient documentation

## 2020-09-13 DIAGNOSIS — Z0000 Encounter for general adult medical examination without abnormal findings: Secondary | ICD-10-CM

## 2020-09-13 DIAGNOSIS — Z86018 Personal history of other benign neoplasm: Secondary | ICD-10-CM | POA: Insufficient documentation

## 2020-09-13 LAB — CBC
HCT: 39.5 % (ref 36.0–46.0)
Hemoglobin: 13.4 g/dL (ref 12.0–15.0)
MCH: 31.9 pg (ref 26.0–34.0)
MCHC: 33.9 g/dL (ref 30.0–36.0)
MCV: 94 fL (ref 80.0–100.0)
Platelets: 206 10*3/uL (ref 150–400)
RBC: 4.2 MIL/uL (ref 3.87–5.11)
RDW: 11.5 % (ref 11.5–15.5)
WBC: 6.6 10*3/uL (ref 4.0–10.5)
nRBC: 0 % (ref 0.0–0.2)

## 2020-09-13 LAB — BASIC METABOLIC PANEL
Anion gap: 10 (ref 5–15)
BUN: 22 mg/dL (ref 8–23)
CO2: 24 mmol/L (ref 22–32)
Calcium: 9.1 mg/dL (ref 8.9–10.3)
Chloride: 100 mmol/L (ref 98–111)
Creatinine, Ser: 1.2 mg/dL — ABNORMAL HIGH (ref 0.44–1.00)
GFR, Estimated: 48 mL/min — ABNORMAL LOW (ref >60)
Glucose, Bld: 103 mg/dL — ABNORMAL HIGH (ref 70–99)
Potassium: 3.7 mmol/L (ref 3.5–5.1)
Sodium: 134 mmol/L — ABNORMAL LOW (ref 135–145)

## 2020-09-13 LAB — D-DIMER, QUANTITATIVE: D-Dimer, Quant: 0.48 ug/mL-FEU (ref 0.00–0.50)

## 2020-09-13 LAB — TROPONIN I (HIGH SENSITIVITY)
Troponin I (High Sensitivity): 4 ng/L (ref <18)
Troponin I (High Sensitivity): 3 ng/L (ref <18)

## 2020-09-13 MED ORDER — LIDOCAINE 5 % EX PTCH: 1.0000 | MEDICATED_PATCH | CUTANEOUS | 0 refills | Status: DC

## 2020-09-13 NOTE — Progress Notes (Signed)
Subjective:   Renee Santos is a 75 y.o. female who presents for Medicare Annual (Subsequent) preventive examination.  Review of Systems     Cardiac Risk Factors include: advanced age (>80mn, >>60women);dyslipidemia;hypertension;family history of premature cardiovascular disease     Objective:    Today's Vitals   09/13/20 1320  BP: 120/70  Pulse: 70  Temp: 97.6 F (36.4 C)  Weight: 102 lb 9.6 oz (46.5 kg)  Height: '5\' 1"'$  (1.549 m)   Body mass index is 19.39 kg/m.  Advanced Directives 09/13/2020 06/17/2019 12/30/2017 12/24/2016 07/25/2015 06/15/2014 12/16/2013  Does Patient Have a Medical Advance Directive? Yes Yes Yes Yes Yes Yes Yes  Type of Advance Directive Living will;Healthcare Power of [place]Living will [place] will [place]Living will - - [place]North [place]Living will  Does patient want to make changes to medical advance directive? No - Patient declined No - Patient declined - - - - -  Copy of [place] Far[place] Chart? No - copy requested No - copy requested No - copy requested No - copy requested - Yes -    Current Medications (verified) Outpatient Encounter Medications as of 09/13/2020  Medication Sig   BIOTIN FORTE PO Take 1 tablet by mouth.   Calcium Carbonate-Vit D-Min (CALTRATE PLUS PO) Take by mouth daily.     clobetasol cream (TEMOVATE) 0AB-123456789% Apply 1 application topically 2 (two) times daily.   hydrochlorothiazide (HYDRODIURIL) 25 MG tablet Take 1/2 tablet by mouth daily   levothyroxine (SYNTHROID) 75 MCG tablet Take 1 tablet (75 mcg total) by mouth daily. Take 6 days a week only   LORazepam (ATIVAN) 0.5 MG tablet Take 1 tablet (0.5 mg total) by mouth 2 (two) times daily as needed. for anxiety   metoprolol succinate (TOPROL-XL) 50 MG 24 hr tablet Take 1/2 tablet by mouth every day   multivitamin-lutein (OCUVITE-LUTEIN) CAPS capsule Take 1 capsule by mouth daily.    estradiol (ESTRACE) 0.5 MG tablet Take 1 tablet (0.5 mg total) by mouth daily. (Patient not taking: Reported on 09/13/2020)   FLUZONE HIGH-DOSE QUADRIVALENT 0.7 ML SUSY    No facility-administered encounter medications on file as of 09/13/2020.    Allergies (verified) Patient has no known allergies.   History: Past Medical History:  Diagnosis Date   Cancer ([facility] 2011   Basal Cell of nose; Dr JMartinique  Herpes zoster 2008   posterior thorax   Hypertension    Neurofibroma of back 2013    Dr Amy JMartinique   Osteopenia 2016   T score -1.6 FRAX not calculated because of HRT   Psoriasis    Dr Amy JMartinique  Thyroid nodule    Past Surgical History:  Procedure Laterality Date   ABDOMINAL HYSTERECTOMY      LSO  & appendectomy for endometrioma   @ age38 for ovarian cyst   APPENDECTOMY     cataract Bilateral Dec 2016;   not sure of date; wears readers only    COLONOSCOPY  multiple   OOPHORECTOMY     LSO for cyst   TONSILLECTOMY     Family History  Problem Relation Age of Onset   Hypertension Father    Lung cancer Father        remote smoker, deceased 732  Hyperlipidemia Mother        CABG in late 633s  Hypothyroidism Mother    Macular degeneration Mother  Hypertension Mother    Coronary artery disease Mother        bypass surgery   Stroke Mother 67       Deceased   Coronary artery disease Maternal Grandmother    Asthma Maternal Uncle        2   Asthma Maternal Grandfather    Diabetes Neg Hx    Heart attack Neg Hx    Colon cancer Neg Hx    Social History   Socioeconomic History   Marital status: Married    Spouse name: Not on file   Number of children: 2   Years of education: Not on file   Highest education level: Not on file  Occupational History   Not on file  Tobacco Use   Smoking status: Former   Smokeless tobacco: Never   Tobacco comments:    college only socially  Vaping Use   Vaping Use: Never used  Substance and Sexual Activity   Alcohol use: Yes     Alcohol/week: 6.0 standard drinks    Types: 6 Glasses of wine per week   Drug use: No   Sexual activity: Yes    Birth control/protection: Surgical    Comment: HYST-1st intercourse 75 yo-Fewer than 5 partners  Other Topics Concern   Not on file  Social History Narrative   Lives with husband x 45 years in a two story home.  Has 2 children.     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     Education: college.   Social Determinants of Health   Financial Resource Strain: Low Risk    Difficulty of Paying Living Expenses: Not hard at all  Food Insecurity: No Food Insecurity   Worried About Charity fundraiser in the Last Year: Never true   [place] in the Last Year: Never true  Transportation Needs: No Transportation Needs   Lack of Transportation (Medical): No   Lack of Transportation (Non-Medical): No  Physical Activity: Sufficiently Active   Days of Exercise per Week: 5 days   Minutes of Exercise per Session: 30 min  Stress: No Stress Concern Present   Feeling of Stress : Not at all  Social Connections: Socially Integrated   Frequency of Communication with Friends and Family: More than three times a week   Frequency of Social Gatherings with Friends and Family: More than three times a week   Attends Religious Services: More than 4 times per year   Active Member of [employer] or Organizations: Yes   Attends Music therapist: More than 4 times per year   Marital Status: Married    Tobacco Counseling Counseling given: Not Answered Tobacco comments: college only socially   Clinical Intake:  Pre-visit preparation completed: Yes  Pain : No/denies pain     BMI - recorded: 19.39 Nutritional Status: BMI of 19-24  Normal Nutritional Risks: None Diabetes: No  How often do you need to have someone help you when you read instructions, pamphlets, or other written materials from your doctor or pharmacy?: 1 - Never What is the last grade level you completed in school?:  Bachelor's Degree in Social Work  Diabetic? no  Interpreter Needed?: No  Information entered by :: Lisette Abu, LPN   Activities of Daily Living In your present state of health, do you have any difficulty performing the following activities: 09/13/2020  Hearing? N  Vision? N  Difficulty concentrating or making decisions? N  Walking or climbing stairs? N  Dressing or  bathing? N  Doing errands, shopping? N  Preparing Food and eating ? N  Using the Toilet? N  In the past six months, have you accidently leaked urine? N  Do you have problems with loss of bowel control? N  Managing your Medications? N  Managing your Finances? N  Housekeeping or managing your Housekeeping? N  Some recent data might be hidden    Patient Care Team: Binnie Rail, MD as PCP - General (Internal Medicine) Martinique, Amy, MD as Consulting Physician (Dermatology) Phineas Real, Belinda Block, MD (Inactive) as Consulting Physician (Gynecology) Delice Bison Darnelle Maffucci, [facility] as Pharmacist (Pharmacist)  Indicate any recent Medical Services you may have received from other than [facility] providers in the past year (date may be approximate).     Assessment:   This is a routine wellness examination for [facility].  Hearing/Vision screen Hearing Screening - Comments:: Patient declined any hearing difficulty. Vision Screening - Comments:: Patient wears glasses for reading only.  Eye exam done once a year by [facility].  Dietary issues and exercise activities discussed: Current Exercise Habits: Home exercise routine, Type of exercise: walking;Other - see comments (walk 5 miles every day, golfing), Time (Minutes): 30, Frequency (Times/Week): 5, Weekly Exercise (Minutes/Week): 150, Intensity: Moderate, Exercise limited by: None identified   Goals Addressed   None   Depression Screen PHQ 2/9 Scores 09/13/2020 06/17/2019 02/02/2019 12/30/2017 12/24/2016 07/25/2015 06/15/2014  PHQ - 2 Score 0 0 0 0 0 0 0  PHQ- 9 Score - - - 1 2 - -     Fall Risk Fall Risk  09/13/2020 06/17/2019 02/02/2019 12/30/2017 12/24/2016  Falls in the past year? 0 0 0 0 No  Number falls in past yr: 0 0 0 - -  Injury with Fall? 0 0 - - -  Risk for fall due to : No Fall Risks No Fall Risks - - -  Follow up Falls evaluation completed Falls evaluation completed;Education provided - - -    FALL RISK PREVENTION PERTAINING TO THE HOME:  Any stairs in or around the home? Yes  If so, are there any without handrails? No  Home free of loose throw rugs in walkways, pet beds, electrical cords, etc? Yes  Adequate lighting in your home to reduce risk of falls? Yes   ASSISTIVE DEVICES UTILIZED TO PREVENT FALLS:  Life alert? Yes  Use of a cane, walker or w/c? No  Grab bars in the bathroom? No  Shower chair or bench in shower? No  Elevated toilet seat or a handicapped toilet? No   TIMED UP AND GO:  Was the test performed? Yes .  Length of time to ambulate 10 feet: 5 sec.   Gait steady and fast without use of assistive device  Cognitive Function: Normal cognitive status assessed by direct observation by this Nurse Health Advisor. No abnormalities found.   MMSE - Mini Mental State Exam 12/24/2016  Orientation to time 5  Orientation to Place 5  Registration 3  Attention/ Calculation 3  Recall 2  Language- name 2 objects 2  Language- repeat 1  Language- follow 3 step command 3  Language- read & follow direction 1  Write a sentence 1  Copy design 1  Total score 27        Immunizations Immunization History  Administered Date(s) Administered   Fluad Quad(high Dose 65+) 10/07/2018   H1N1 03/01/2008   Hepatitis A 07/05/1997, 04/19/2004   Hepatitis B 05/30/2008, 07/05/2008, 03/27/2011   Influenza Split 12/04/2010, 11/05/2011  Influenza, High Dose Seasonal PF 12/27/2015, 10/08/2016, 11/03/2019   Influenza,inj,Quad PF,6+ Mos 12/03/2012, 12/15/2013, 12/21/2014, 12/01/2017   Influenza-Unspecified 11/11/2017   Meningococcal Conjugate 07/05/2008    PFIZER(Purple Top)SARS-COV-2 Vaccination 03/08/2019, 03/29/2019, 11/19/2019   Pneumococcal Conjugate-13 06/15/2014   Pneumococcal Polysaccharide-23 04/13/2013   Tdap 04/21/2017   Tetanus 02/21/2014   Typhoid Inactivated 04/19/2004, 04/21/2017   Yellow Fever 05/30/2008   Zoster, Live 02/18/2014    TDAP status: Up to date  Flu Vaccine status: Up to date  Pneumococcal vaccine status: Up to date  Covid-19 vaccine status: Completed vaccines  Qualifies for Shingles Vaccine? Yes   Zostavax completed Yes   Shingrix Completed?: No.    Education has been provided regarding the importance of this vaccine. Patient has been advised to call insurance company to determine out of pocket expense if they have not yet received this vaccine. Advised may also receive vaccine at local pharmacy or Health Dept. Verbalized acceptance and understanding.  Screening Tests Health Maintenance  Topic Date Due   Zoster Vaccines- Shingrix (1 of 2) Never done   COVID-19 Vaccine (4 - Booster for Pfizer series) 03/21/2020   INFLUENZA VACCINE  09/11/2020   MAMMOGRAM  02/11/2022   DEXA SCAN  02/23/2023   COLONOSCOPY (Pts 45-79yr Insurance coverage will need to be confirmed)  12/31/2023   TETANUS/TDAP  04/22/2027   Hepatitis C Screening  Completed   PNA vac Low Risk Adult  Completed   HPV VACCINES  Aged Out    Health Maintenance  Health Maintenance Due  Topic Date Due   Zoster Vaccines- Shingrix (1 of 2) Never done   COVID-19 Vaccine (4 - Booster for Pfizer series) 03/21/2020   INFLUENZA VACCINE  09/11/2020    Colorectal cancer screening: Type of screening: Colonoscopy. Completed 12/30/2013. Repeat every 5 years  Mammogram status: Completed 02/23/2020. Repeat every year  Bone Density status: Completed 02/23/2020. Results reflect: Bone density results: OSTEOPENIA. Repeat every 3 years.  Lung Cancer Screening: (Low Dose CT Chest recommended if Age 73-80years, 30 pack-year currently smoking OR have quit  w/in 15years.) does not qualify.   Lung Cancer Screening Referral: no  Additional Screening:  Hepatitis C Screening: does qualify; Completed yes  Vision Screening: Recommended annual ophthalmology exams for early detection of glaucoma and other disorders of the eye. Is the patient up to date with their annual eye exam?  Yes  Who is the provider or what is the name of the office in which the patient attends annual eye exams? [facility] is not established with a provider, would they like to be referred to a provider to establish care? No .   Dental Screening: Recommended annual dental exams for proper oral hygiene  Community Resource Referral / Chronic Care Management: CRR required this visit?  No   CCM required this visit?  No      Plan:     I have personally reviewed and noted the following in the patient's chart:   Medical and social history Use of alcohol, tobacco or illicit drugs  Current medications and supplements including opioid prescriptions.  Functional ability and status Nutritional status Physical activity Advanced directives List of other physicians Hospitalizations, surgeries, and ER visits in previous 12 months Vitals Screenings to include cognitive, depression, and falls Referrals and appointments  In addition, I have reviewed and discussed with patient certain preventive protocols, quality metrics, and best practice recommendations. A written personalized care plan for preventive services as well as general preventive health recommendations were  provided to patient.     Sheral Flow, LPN   [phone]   Nurse Notes: n/a

## 2020-09-13 NOTE — ED Provider Notes (Signed)
[facility] EMERGENCY DEPT Provider Note   CSN: [id number] Arrival date & time: 09/13/20  1443     History Chief Complaint  Patient presents with   Jaw Pain    Renee Santos is a 75 y.o. female.  The history is provided by the patient, the spouse and medical records. No language interpreter was used.  Chest Pain Pain location:  L chest Pain quality: aching, pressure and radiating   Pain radiates to:  L jaw Pain severity:  Moderate Onset quality:  Sudden Duration:  3 hours Timing:  Constant Progression:  Waxing and waning Chronicity:  New Context: at rest   Relieved by:  Nothing Worsened by:  Nothing Ineffective treatments:  None tried Associated symptoms: no abdominal pain, no altered mental status, no anxiety, no back pain, no claudication, no cough, no diaphoresis, no dizziness, no fatigue, no fever, no headache, no lower extremity edema, no nausea, no near-syncope, no numbness, no palpitations, no shortness of breath, no syncope, no vomiting and no weakness   Risk factors: hypertension   Risk factors: no prior DVT/PE       Past Medical History:  Diagnosis Date   Cancer ([place]) 2011   Basal Cell of nose; Dr Martinique   Herpes zoster 2008   posterior thorax   Hypertension    Neurofibroma of back 2013    Dr Amy Martinique    Osteopenia 2016   T score -1.6 FRAX not calculated because of HRT   Psoriasis    Dr Amy Martinique   Thyroid nodule     Patient Active Problem List   Diagnosis Date Noted   Fecal incontinence 08/03/2019   Paronychia of great toe, left 10/08/2016   Hyperglycemia 07/16/2016   Dizziness 07/16/2016   Lightheadedness 07/16/2016   Calcific tendonitis of right hand 05/01/2015   Herpes simplex 05/01/2015   Situational anxiety 05/01/2015   Essential hypertension 03/01/2008   Osteopenia 03/01/2008   THYROID NODULE 01/13/2007   Hypothyroidism 01/13/2007   HYPERLIPIDEMIA 01/13/2007   PSORIASIS 01/13/2007    Past Surgical History:   Procedure Laterality Date   ABDOMINAL HYSTERECTOMY      LSO  & appendectomy for endometrioma   @ age38 for ovarian cyst   APPENDECTOMY     cataract Bilateral Dec 2016;   not sure of date; wears readers only    COLONOSCOPY  multiple   OOPHORECTOMY     LSO for cyst   TONSILLECTOMY       OB History     Gravida  2   Para  2   Term      Preterm      AB      Living  2      SAB      IAB      Ectopic      Multiple      Live Births              Family History  Problem Relation Age of Onset   Hypertension Father    Lung cancer Father        remote smoker, deceased 51   Hyperlipidemia Mother        CABG in late 27s   Hypothyroidism Mother    Macular degeneration Mother    Hypertension Mother    Coronary artery disease Mother        bypass surgery   Stroke Mother 53       Deceased   Coronary artery disease Maternal  Grandmother    Asthma Maternal Uncle        2   Asthma Maternal Grandfather    Diabetes Neg Hx    Heart attack Neg Hx    Colon cancer Neg Hx     Social History   Tobacco Use   Smoking status: Former   Smokeless tobacco: Never   Tobacco comments:    college only socially  Vaping Use   Vaping Use: Never used  Substance Use Topics   Alcohol use: Yes    Alcohol/week: 6.0 standard drinks    Types: 6 Glasses of wine per week   Drug use: No    Home Medications Prior to Admission medications   Medication Sig Start Date End Date Taking? Authorizing Provider  BIOTIN FORTE PO Take 1 tablet by mouth.    [provider]  Calcium Carbonate-Vit D-Min (CALTRATE PLUS PO) Take by mouth daily.      [provider]  clobetasol cream (TEMOVATE) [id number] % Apply 1 application topically 2 (two) times daily. 05/01/15   Binnie Rail, MD  estradiol (ESTRACE) 0.5 MG tablet Take 1 tablet (0.5 mg total) by mouth daily. Patient not taking: Reported on 09/13/2020 06/07/20   Tamela Gammon, NP  FLUZONE HIGH-DOSE QUADRIVALENT 0.7 ML SUSY   11/03/19   [provider]  hydrochlorothiazide (HYDRODIURIL) 25 MG tablet Take 1/2 tablet by mouth daily 03/07/20   Binnie Rail, MD  levothyroxine (SYNTHROID) 75 MCG tablet Take 1 tablet (75 mcg total) by mouth daily. Take 6 days a week only 03/10/20   Binnie Rail, MD  LORazepam (ATIVAN) 0.5 MG tablet Take 1 tablet (0.5 mg total) by mouth 2 (two) times daily as needed. for anxiety 03/07/20   Binnie Rail, MD  metoprolol succinate (TOPROL-XL) 50 MG 24 hr tablet Take 1/2 tablet by mouth every day 03/07/20   Binnie Rail, MD  multivitamin-lutein [facility]) CAPS capsule Take 1 capsule by mouth daily.    [provider]    Allergies    Patient has no known allergies.  Review of Systems   Review of Systems  Constitutional:  Negative for chills, diaphoresis, fatigue and fever.  HENT:  Negative for congestion.   Eyes:  Negative for visual disturbance.  Respiratory:  Negative for cough, chest tightness, shortness of breath and wheezing.   Cardiovascular:  Positive for chest pain. Negative for palpitations, claudication, leg swelling, syncope and near-syncope.  Gastrointestinal:  Negative for abdominal pain, constipation, diarrhea, nausea and vomiting.  Genitourinary:  Negative for dysuria and flank pain.  Musculoskeletal:  Negative for back pain and neck pain.  Skin:  Negative for rash and wound.  Neurological:  Negative for dizziness, weakness, light-headedness, numbness and headaches.  Psychiatric/Behavioral:  Negative for agitation.   All other systems reviewed and are negative.  Physical Exam Updated Vital Signs BP (!) 186/80 (BP Location: Right Arm)   Pulse 65   Temp 98.7 F (37.1 C)   Resp 12   SpO2 100%   Physical Exam Vitals and nursing note reviewed.  Constitutional:      General: She is not in acute distress.    Appearance: She is well-developed. She is not ill-appearing, toxic-appearing or diaphoretic.  HENT:     Head: Normocephalic and  atraumatic.     Mouth/Throat:     Mouth: Mucous membranes are moist.     Pharynx: No oropharyngeal exudate or posterior oropharyngeal erythema.  Eyes:     Conjunctiva/sclera: Conjunctivae normal.  Cardiovascular:     Rate and Rhythm: Normal rate and regular rhythm.     Pulses: Normal pulses.     Heart sounds: No murmur heard. Pulmonary:     Effort: Pulmonary effort is normal. No respiratory distress.     Breath sounds: Normal breath sounds. No wheezing, rhonchi or rales.  Chest:     Chest wall: Tenderness present.    Abdominal:     Palpations: Abdomen is soft.     Tenderness: There is no abdominal tenderness. There is no right CVA tenderness, left CVA tenderness, guarding or rebound.  Musculoskeletal:        General: No tenderness.     Cervical back: Neck supple.  Skin:    General: Skin is warm and dry.     Capillary Refill: Capillary refill takes less than 2 seconds.  Neurological:     General: No focal deficit present.     Mental Status: She is alert.  Psychiatric:        Mood and Affect: Mood normal.    ED Results / Procedures / Treatments   Labs (all labs ordered are listed, but only abnormal results are displayed) Labs Reviewed  BASIC METABOLIC PANEL - Abnormal; Notable for the following components:      Result Value   Sodium 134 (*)    Glucose, Bld 103 (*)    Creatinine, Ser 1.20 (*)    GFR, Estimated 48 (*)    All other components within normal limits  CBC  D-DIMER, QUANTITATIVE  TROPONIN I (HIGH SENSITIVITY)  TROPONIN I (HIGH SENSITIVITY)    EKG None ED ECG REPORT   Date: 09/13/2020  Rate: 72  Rhythm: normal sinus rhythm  QRS Axis: normal  Intervals: normal  ST/T Wave abnormalities: normal  Conduction Disutrbances:none  Narrative Interpretation:   Old EKG Reviewed: none available  I have personally reviewed the EKG tracing and agree with the computerized printout as noted.    Radiology DG Chest 2 View  Result Date: 09/13/2020 CLINICAL  DATA:  Chest pain. EXAM: CHEST - 2 VIEW COMPARISON:  03/10/2012. FINDINGS: Mediastinum and hilar structures normal. COPD. Stable calcified nodule left upper lung consistent with calcified granuloma. No focal infiltrate. No pleural effusion or pneumothorax. IMPRESSION: No acute cardiopulmonary disease. Electronically Signed   By: Marcello Moores  Register   On: 09/13/2020 15:25    Procedures Procedures   Medications Ordered in ED Medications - No data to display  ED Course  I have reviewed the triage vital signs and the nursing notes.  Pertinent labs & imaging results that were available during my care of the patient were reviewed by me and considered in my medical decision making (see chart for details).    MDM Rules/Calculators/A&P                           Renee Santos is a 75 y.o. female with a past medical history significant for thyroid disease, hyperlipidemia, hypertension, and previous skin cancer who presents with left-sided chest pain.  Patient reports that she walked today and then played 9 holes of golf but did not have any symptoms during this.  She says that this afternoon around 2:30 PM, she started having discomfort in her left jaw and chest after a well visit with her PCP.  She reports it is a dull aching and pressure type pain from her jaw to her chest.  It is not pleuritic and not exertional.  She denies  any nausea, vomiting, diaphoresis, or radiation down the arms.  She has no history of this discomfort.  She denies any trauma.  She denies abdominal pain.  No leg pain or leg swelling and no history of DVT or PE.  No history of cardiac disease she reports.  EKG does not show STEMI.  On exam, chest is tender to palpation and lungs are clear.  No murmur.  Neck is nontender.  Oropharyngeal exam unremarkable.  Good pulses in extremities.  Normal sensation and strength in extremities.  Abdomen nontender.  Back and flanks nontender.  Clinically suspect muscle scale discomfort after  playing golf today however given the pain rating from her chest to her jaw, it is reasonable to get a work-up to look for concerning etiology including cardiac, pulmonary, or musculoskeletal.  We will get chest x-ray, labs including a delta troponin and a D-dimer as she is not able to use the PERC score given her age, and other labs.  Anticipate reassessment after work-up.  8:39 PM Work-up continue to return.  Delta troponin was negative.  The D-dimer was negative.  Low suspicion for a cardiac cause of her symptoms at this time with a negative troponin, reassuring EKG, and tenderness provoking the symptoms on her chest.  The D-dimer suggest no PE and her other labs reflected some mild dehydration but otherwise reassuring.  Chest x-ray reassuring.  Had a shared decision made conversation with patient about work-up and as her symptoms are slightly improved and her work-up is reassuring, we do feel she is safe for discharge home however we did discuss starting her on a Lidoderm patch regimen to help with the discomfort and have her follow-up with both her PCP and likely cardiologist.  She understands extremely strict return precautions.  Given her otherwise well appearance, patient be discharged home.  She was discharged in good condition  Final Clinical Impression(s) / ED Diagnoses Final diagnoses:  Chest pain, unspecified type    Rx / DC Orders ED Discharge Orders          Ordered    lidocaine (LIDODERM) 5 %  Every 24 hours        09/13/20 2040           Clinical Impression: 1. Chest pain, unspecified type     Disposition: Discharge  Condition: Good  I have discussed the results, Dx and Tx plan with the pt(& family if present). He/she/they expressed understanding and agree(s) with the plan. Discharge instructions discussed at great length. Strict return precautions discussed and pt &/or family have verbalized understanding of the instructions. No further questions at time of  discharge.    New Prescriptions   LIDOCAINE (LIDODERM) 5 %    Place 1 patch onto the skin daily. Remove & Discard patch within 12 hours or as directed by MD    Follow Up: Binnie Rail, MD [place] [address] [phone]     [facility] [place] [phone] [place] [facility] [place] [phone]       , Gwenyth Allegra, MD 09/13/20 2042

## 2020-09-13 NOTE — ED Triage Notes (Signed)
Patient reports to the ER for chest pain with jaw pain and tightness. Patient states about 30 minutes ago she started having the dull ache to the jaw that has radiated to the chest

## 2020-09-13 NOTE — Patient Instructions (Signed)
Renee Santos , Thank you for taking time to come for your Medicare Wellness Visit. I appreciate your ongoing commitment to your health goals. Please review the following plan we discussed and let me know if I can assist you in the future.   Screening recommendations/referrals: Colonoscopy: last done 12/30/2013; due every 10 years (due 12/31/2023) Mammogram: last done 02/23/2020; due every 2 years (due 02/22/2022) Bone Density: last done 02/23/2020; due every 3 years (due 02/23/2023) Recommended yearly ophthalmology/optometry visit for glaucoma screening and checkup Recommended yearly dental visit for hygiene and checkup  Vaccinations: Influenza vaccine: 11/03/2019; due Fall 2022 Pneumococcal vaccine: 04/13/2013, 06/15/2014 Tdap vaccine: 04/21/2017; due every 10 years (due 04/22/2027) Shingles vaccine: Please call your insurance company to determine your out of pocket expense for the Shingrix vaccine. You may receive this vaccine at your local pharmacy.   Covid-19: 03/08/2019, 03/29/2019, 11/19/2019  Advanced directives: Please bring a copy of your health care power of attorney and living will to the office at your convenience.  Conditions/risks identified: Yes; Client understands the importance of follow-up with providers by attending scheduled visits and discussed goals to eat healthier, increase physical activity, exercise the brain, socialize more, get enough sleep and make time for laughter.  Next appointment: Please schedule your next Medicare Wellness Visit with your Nurse Health Advisor in 1 year by calling [phone].   Preventive Care 10 Years and Older, Female Preventive care refers to lifestyle choices and visits with your health care provider that can promote health and wellness. What does preventive care include? A yearly physical exam. This is also called an annual well check. Dental exams once or twice a year. Routine eye exams. Ask your health care provider how often you should have  your eyes checked. Personal lifestyle choices, including: Daily care of your teeth and gums. Regular physical activity. Eating a healthy diet. Avoiding tobacco and drug use. Limiting alcohol use. Practicing safe sex. Taking low-dose aspirin every day. Taking vitamin and mineral supplements as recommended by your health care provider. What happens during an annual well check? The services and screenings done by your health care provider during your annual well check will depend on your age, overall health, lifestyle risk factors, and family history of disease. Counseling  Your health care provider may ask you questions about your: Alcohol use. Tobacco use. Drug use. Emotional well-being. Home and relationship well-being. Sexual activity. Eating habits. History of falls. Memory and ability to understand (cognition). Work and work Statistician. Reproductive health. Screening  You may have the following tests or measurements: Height, weight, and BMI. Blood pressure. Lipid and cholesterol levels. These may be checked every 5 years, or more frequently if you are over 14 years old. Skin check. Lung cancer screening. You may have this screening every year starting at age 18 if you have a 30-pack-year history of smoking and currently smoke or have quit within the past 15 years. Fecal occult blood test (FOBT) of the stool. You may have this test every year starting at age 76. Flexible sigmoidoscopy or colonoscopy. You may have a sigmoidoscopy every 5 years or a colonoscopy every 10 years starting at age 58. Hepatitis C blood test. Hepatitis B blood test. Sexually transmitted disease (STD) testing. Diabetes screening. This is done by checking your blood sugar (glucose) after you have not eaten for a while (fasting). You may have this done every 1-3 years. Bone density scan. This is done to screen for osteoporosis. You may have this done starting at age 19. Mammogram.  This may be done every  1-2 years. Talk to your health care provider about how often you should have regular mammograms. Talk with your health care provider about your test results, treatment options, and if necessary, the need for more tests. Vaccines  Your health care provider may recommend certain vaccines, such as: Influenza vaccine. This is recommended every year. Tetanus, diphtheria, and acellular pertussis (Tdap, Td) vaccine. You may need a Td booster every 10 years. Zoster vaccine. You may need this after age 12. Pneumococcal 13-valent conjugate (PCV13) vaccine. One dose is recommended after age 4. Pneumococcal polysaccharide (PPSV23) vaccine. One dose is recommended after age 20. Talk to your health care provider about which screenings and vaccines you need and how often you need them. This information is not intended to replace advice given to you by your health care provider. Make sure you discuss any questions you have with your health care provider. Document Released: 02/24/2015 Document Revised: 10/18/2015 Document Reviewed: 11/29/2014 Elsevier Interactive Patient Education  2017 [employer] Prevention in the Home Falls can cause injuries. They can happen to people of all ages. There are many things you can do to make your home safe and to help prevent falls. What can I do on the outside of my home? Regularly fix the edges of walkways and driveways and fix any cracks. Remove anything that might make you trip as you walk through a door, such as a raised step or threshold. Trim any bushes or trees on the path to your home. Use bright outdoor lighting. Clear any walking paths of anything that might make someone trip, such as rocks or tools. Regularly check to see if handrails are loose or broken. Make sure that both sides of any steps have handrails. Any raised decks and porches should have guardrails on the edges. Have any leaves, snow, or ice cleared regularly. Use sand or salt on walking  paths during winter. Clean up any spills in your garage right away. This includes oil or grease spills. What can I do in the bathroom? Use night lights. Install grab bars by the toilet and in the tub and shower. Do not use towel bars as grab bars. Use non-skid mats or decals in the tub or shower. If you need to sit down in the shower, use a plastic, non-slip stool. Keep the floor dry. Clean up any water that spills on the floor as soon as it happens. Remove soap buildup in the tub or shower regularly. Attach bath mats securely with double-sided non-slip rug tape. Do not have throw rugs and other things on the floor that can make you trip. What can I do in the bedroom? Use night lights. Make sure that you have a light by your bed that is easy to reach. Do not use any sheets or blankets that are too big for your bed. They should not hang down onto the floor. Have a firm chair that has side arms. You can use this for support while you get dressed. Do not have throw rugs and other things on the floor that can make you trip. What can I do in the kitchen? Clean up any spills right away. Avoid walking on wet floors. Keep items that you use a lot in easy-to-reach places. If you need to reach something above you, use a strong step stool that has a grab bar. Keep electrical cords out of the way. Do not use floor polish or wax that makes floors slippery. If you  must use wax, use non-skid floor wax. Do not have throw rugs and other things on the floor that can make you trip. What can I do with my stairs? Do not leave any items on the stairs. Make sure that there are handrails on both sides of the stairs and use them. Fix handrails that are broken or loose. Make sure that handrails are as long as the stairways. Check any carpeting to make sure that it is firmly attached to the stairs. Fix any carpet that is loose or worn. Avoid having throw rugs at the top or bottom of the stairs. If you do have  throw rugs, attach them to the floor with carpet tape. Make sure that you have a light switch at the top of the stairs and the bottom of the stairs. If you do not have them, ask someone to add them for you. What else can I do to help prevent falls? Wear shoes that: Do not have high heels. Have rubber bottoms. Are comfortable and fit you well. Are closed at the toe. Do not wear sandals. If you use a stepladder: Make sure that it is fully opened. Do not climb a closed stepladder. Make sure that both sides of the stepladder are locked into place. Ask someone to hold it for you, if possible. Clearly mark and make sure that you can see: Any grab bars or handrails. First and last steps. Where the edge of each step is. Use tools that help you move around (mobility aids) if they are needed. These include: Canes. Walkers. Scooters. Crutches. Turn on the lights when you go into a dark area. Replace any light bulbs as soon as they burn out. Set up your furniture so you have a clear path. Avoid moving your furniture around. If any of your floors are uneven, fix them. If there are any pets around you, be aware of where they are. Review your medicines with your doctor. Some medicines can make you feel dizzy. This can increase your chance of falling. Ask your doctor what other things that you can do to help prevent falls. This information is not intended to replace advice given to you by your health care provider. Make sure you discuss any questions you have with your health care provider. Document Released: 11/24/2008 Document Revised: 07/06/2015 Document Reviewed: 03/04/2014 Elsevier Interactive Patient Education  2017 [employer].

## 2020-09-13 NOTE — Discharge Instructions (Addendum)
Your work-up today was overall reassuring.  Your heart enzymes were negative both times we checked them and your EKG was completely reassuring.  The blood clot test called a D-dimer was negative, doubt pulmonary embolism.  Your chest x-ray was reassuring and your other labs were also overall reassuring with some mild dehydration.  As your chest was tender, we do suspect it is a chest wall type discomfort with musculoskeletal pain.  Please use the Lidoderm patches to help with the discomfort and follow-up with both your primary doctor and a cardiologist.  If any symptoms change or worsen acutely, please return to the nearest emergency department.  It was a pleasure taking care of you today.

## 2020-09-14 ENCOUNTER — Other Ambulatory Visit (INDEPENDENT_AMBULATORY_CARE_PROVIDER_SITE_OTHER): Payer: PPO

## 2020-09-14 DIAGNOSIS — E039 Hypothyroidism, unspecified: Secondary | ICD-10-CM

## 2020-09-14 LAB — TSH: TSH: 2.07 u[IU]/mL (ref 0.35–5.50)

## 2020-09-18 ENCOUNTER — Ambulatory Visit (INDEPENDENT_AMBULATORY_CARE_PROVIDER_SITE_OTHER): Payer: PPO | Admitting: Internal Medicine

## 2020-09-18 ENCOUNTER — Encounter: Payer: Self-pay | Admitting: Internal Medicine

## 2020-09-18 ENCOUNTER — Other Ambulatory Visit: Payer: Self-pay

## 2020-09-18 DIAGNOSIS — M542 Cervicalgia: Secondary | ICD-10-CM | POA: Diagnosis not present

## 2020-09-18 DIAGNOSIS — R21 Rash and other nonspecific skin eruption: Secondary | ICD-10-CM | POA: Diagnosis not present

## 2020-09-18 MED ORDER — METHYLPREDNISOLONE 4 MG PO TBPK: ORAL_TABLET | ORAL | 0 refills | Status: DC

## 2020-09-18 NOTE — Patient Instructions (Addendum)
    Medications changes include :   steroid taper    Your prescription(s) have been submitted to your pharmacy. Please take as directed and contact our office if you believe you are having problem(s) with the medication(s).

## 2020-09-18 NOTE — Assessment & Plan Note (Addendum)
Acute No obvious cause No other symptoms-fever, fatigue, cold symptoms and lesions are not painful so this is not consistent with monkey pox-her only risk would be travel ?  Allergic in nature Benadryl at night prn Medrol dose pak-discussed possible side effects She does have a friend who is a dermatologist and will last take a look at her lesions to get their opinion She will keep me updated

## 2020-09-18 NOTE — Progress Notes (Signed)
Subjective:    Patient ID: Renee Santos, female    DOB: 07/03/1945, 75 y.o.   MRN: [id number]  HPI The patient is here for an acute visit.   Rash - she noticed something 2 days ago on left leg and now she has lesions in her right groin, bilateral legs, arms, back and a couple in her nose.  They do not hurt, but they are itchy.  There is fluid inside.  It starts off as a little pimple and then she gets a gooey yellow fluid out.  She denies any fevers, fatigue other than jet lag, body aches, cold symptoms, headaches.  She denies any new medications, products, or concerning exposures.   Trip to [place] - returned last week.  Her husband does not have any similar symptoms.  She is worried about if this was something contagious.  She has taken Benadryl last night-the itch is not so bad that it would have kept her awake.  Medications and allergies reviewed with patient and updated if appropriate.  Patient Active Problem List   Diagnosis Date Noted   Fecal incontinence 08/03/2019   Paronychia of great toe, left 10/08/2016   Hyperglycemia 07/16/2016   Dizziness 07/16/2016   Lightheadedness 07/16/2016   Calcific tendonitis of right hand 05/01/2015   Herpes simplex 05/01/2015   Situational anxiety 05/01/2015   Essential hypertension 03/01/2008   Osteopenia 03/01/2008   THYROID NODULE 01/13/2007   Hypothyroidism 01/13/2007   HYPERLIPIDEMIA 01/13/2007   PSORIASIS 01/13/2007    Current Outpatient Medications on File Prior to Visit  Medication Sig Dispense Refill   BIOTIN FORTE PO Take 1 tablet by mouth.     Calcium Carbonate-Vit D-Min (CALTRATE PLUS PO) Take by mouth daily.       clobetasol cream (TEMOVATE) AB-123456789 % Apply 1 application topically 2 (two) times daily. 45 g 1   estradiol (ESTRACE) 0.5 MG tablet Take 1 tablet (0.5 mg total) by mouth daily. 90 tablet 1   FLUZONE HIGH-DOSE QUADRIVALENT 0.7 ML SUSY      hydrochlorothiazide (HYDRODIURIL) 25 MG tablet Take 1/2 tablet by mouth  daily 45 tablet 3   levothyroxine (SYNTHROID) 75 MCG tablet Take 1 tablet (75 mcg total) by mouth daily. Take 6 days a week only 90 tablet 3   lidocaine (LIDODERM) 5 % Place 1 patch onto the skin daily. Remove & Discard patch within 12 hours or as directed by MD 15 patch 0   LORazepam (ATIVAN) 0.5 MG tablet Take 1 tablet (0.5 mg total) by mouth 2 (two) times daily as needed. for anxiety 15 tablet 1   metoprolol succinate (TOPROL-XL) 50 MG 24 hr tablet Take 1/2 tablet by mouth every day 45 tablet 3   multivitamin-lutein (OCUVITE-LUTEIN) CAPS capsule Take 1 capsule by mouth daily.     No current facility-administered medications on file prior to visit.    Past Medical History:  Diagnosis Date   Cancer [facility]) 2011   Basal Cell of nose; Dr Martinique   Herpes zoster 2008   posterior thorax   Hypertension    Neurofibroma of back 2013    Dr Amy Martinique    Osteopenia 2016   T score -1.6 FRAX not calculated because of HRT   Psoriasis    Dr Amy Martinique   Thyroid nodule     Past Surgical History:  Procedure Laterality Date   ABDOMINAL HYSTERECTOMY      LSO  & appendectomy for endometrioma   @ age38 for ovarian cyst  APPENDECTOMY     cataract Bilateral Dec 2016;   not sure of date; wears readers only    COLONOSCOPY  multiple   OOPHORECTOMY     LSO for cyst   TONSILLECTOMY      Social History   Socioeconomic History   Marital status: Married    Spouse name: Not on file   Number of children: 2   Years of education: Not on file   Highest education level: Not on file  Occupational History   Not on file  Tobacco Use   Smoking status: Former   Smokeless tobacco: Never   Tobacco comments:    college only socially  Vaping Use   Vaping Use: Never used  Substance and Sexual Activity   Alcohol use: Yes    Alcohol/week: 6.0 standard drinks    Types: 6 Glasses of wine per week   Drug use: No   Sexual activity: Yes    Birth control/protection: Surgical    Comment: HYST-1st intercourse  75 yo-Fewer than 5 partners  Other Topics Concern   Not on file  Social History Narrative   Lives with husband x 45 years in a two story home.  Has 2 children.     Retired Education officer, museum in [phone].     Education: college.   Social Determinants of Health   Financial Resource Strain: Low Risk    Difficulty of Paying Living Expenses: Not hard at all  Food Insecurity: No Food Insecurity   Worried About Charity fundraiser in the Last Year: Never true   [place] in the Last Year: Never true  Transportation Needs: No Transportation Needs   Lack of Transportation (Medical): No   Lack of Transportation (Non-Medical): No  Physical Activity: Sufficiently Active   Days of Exercise per Week: 5 days   Minutes of Exercise per Session: 30 min  Stress: No Stress Concern Present   Feeling of Stress : Not at all  Social Connections: Socially Integrated   Frequency of Communication with Friends and Family: More than three times a week   Frequency of Social Gatherings with Friends and Family: More than three times a week   Attends Religious Services: More than 4 times per year   Active Member of [employer] or Organizations: Yes   Attends Music therapist: More than 4 times per year   Marital Status: Married    Family History  Problem Relation Age of Onset   Hypertension Father    Lung cancer Father        remote smoker, deceased 62   Hyperlipidemia Mother        CABG in late 69s   Hypothyroidism Mother    Macular degeneration Mother    Hypertension Mother    Coronary artery disease Mother        bypass surgery   Stroke Mother 46       Deceased   Coronary artery disease Maternal Grandmother    Asthma Maternal Uncle        2   Asthma Maternal Grandfather    Diabetes Neg Hx    Heart attack Neg Hx    Colon cancer Neg Hx     Review of Systems  Constitutional:  Negative for chills, fatigue (Outside of mild from just like) and fever.  HENT:  Negative for congestion, sinus  pressure, sinus pain and sore throat.   Respiratory:  Negative for cough, shortness of breath and wheezing.   Cardiovascular:  Negative for chest pain and palpitations.  Gastrointestinal:  Negative for abdominal pain, constipation and diarrhea.  Musculoskeletal:  Negative for arthralgias and myalgias.  Neurological:  Negative for dizziness, light-headedness and headaches.      Objective:   Vitals:   09/18/20 1557  BP: 136/82  Pulse: 68  Temp: 98.2 F (36.8 C)  SpO2: 98%   BP Readings from Last 3 Encounters:  09/18/20 136/82  09/13/20 (!) 158/76  09/13/20 120/70   Wt Readings from Last 3 Encounters:  09/18/20 101 lb 12.8 oz (46.2 kg)  09/13/20 102 lb 9.6 oz (46.5 kg)  06/07/20 104 lb (47.2 kg)   Body mass index is 19.23 kg/m.   Physical Exam Constitutional:      General: She is not in acute distress.    Appearance: Normal appearance. She is not ill-appearing.  HENT:     Head: Normocephalic and atraumatic.  Skin:    General: Skin is warm and dry.     Findings: Lesion (A few lesions 1 in right hand, a few on legs, lower back, right groin-a couple of them look like scabbed pimples, couple look like blisters-nontender, no lesions on palms) present.  Neurological:     Mental Status: She is alert.           Assessment & Plan:    See Problem List for Assessment and Plan of chronic medical problems.    This visit occurred during the SARS-CoV-2 public health emergency.  Safety protocols were in place, including screening questions prior to the visit, additional usage of staff PPE, and extensive cleaning of exam room while observing appropriate contact time as indicated for disinfecting solutions.

## 2020-10-03 ENCOUNTER — Other Ambulatory Visit: Payer: Self-pay | Admitting: Internal Medicine

## 2020-10-09 NOTE — Progress Notes (Signed)
Subjective:    Patient ID: Renee Santos, female    DOB: Jun 06, 1945, 75 y.o.   MRN: [id number]  HPI The patient is here for follow up from the ED  09/13/20 - chest pain - left sided, achy, pressure, radiating to left jaw.  Lasted > 3 hrs, waxing and waning, at rest.  Trop x 2 neg.  EKG w/o change, cxr, cbc, cmp, d-dimer normal.  Chest pain was thought to be musculoskeletal in nature.  She had played golf that morning and it was very hot out so the combination of golfing with possible some dehydration may have triggered muscle pain.  After leaving the emergency room it took a couple of days for her chest pain to go away.  She has not had any recurrences of chest pain.  She exercises regularly without difficulty.  Constipation -she still suffers with chronic constipation.  She has tried Benefiber in the past, but her stool is too soft and she had some urgency with that.  She is trying to eat good balanced diet with lots of fiber.  She walks regularly and drinks a lot of fluids.  She has been taking align for a while and that does not seem to help and wondered what else she could do.  She denies any associated abdominal pain    Medications and allergies reviewed with patient and updated if appropriate.  Patient Active Problem List   Diagnosis Date Noted   Rash 09/18/2020   Fecal incontinence 08/03/2019   Paronychia of great toe, left 10/08/2016   Hyperglycemia 07/16/2016   Dizziness 07/16/2016   Lightheadedness 07/16/2016   Calcific tendonitis of right hand 05/01/2015   Herpes simplex 05/01/2015   Situational anxiety 05/01/2015   Essential hypertension 03/01/2008   Osteopenia 03/01/2008   THYROID NODULE 01/13/2007   Hypothyroidism 01/13/2007   HYPERLIPIDEMIA 01/13/2007   PSORIASIS 01/13/2007    Current Outpatient Medications on File Prior to Visit  Medication Sig Dispense Refill   BIOTIN FORTE PO Take 1 tablet by mouth.     Calcium Carbonate-Vit D-Min (CALTRATE PLUS PO) Take by  mouth daily.       clobetasol cream (TEMOVATE) AB-123456789 % Apply 1 application topically 2 (two) times daily. 45 g 1   FLUZONE HIGH-DOSE QUADRIVALENT 0.7 ML SUSY      hydrochlorothiazide (HYDRODIURIL) 25 MG tablet Take 1/2 tablet by mouth daily 45 tablet 3   levothyroxine (SYNTHROID) 75 MCG tablet Take 1 tablet (75 mcg total) by mouth daily. Take 6 days a week only 90 tablet 3   lidocaine (LIDODERM) 5 % Place 1 patch onto the skin daily. Remove & Discard patch within 12 hours or as directed by MD 15 patch 0   LORazepam (ATIVAN) 0.5 MG tablet Take 1 tablet by mouth twice daily as needed for anxiety 15 tablet 0   methylPREDNISolone (MEDROL DOSEPAK) 4 MG TBPK tablet 24 mg PO on day 1, then decr. by 4 mg/day x5 days 21 tablet 0   metoprolol succinate (TOPROL-XL) 50 MG 24 hr tablet Take 1/2 tablet by mouth every day 45 tablet 3   multivitamin-lutein (OCUVITE-LUTEIN) CAPS capsule Take 1 capsule by mouth daily.     estradiol (ESTRACE) 0.5 MG tablet Take 1 tablet (0.5 mg total) by mouth daily. (Patient not taking: Reported on 10/10/2020) 90 tablet 1   No current facility-administered medications on file prior to visit.    Past Medical History:  Diagnosis Date   Cancer [facility]) 2011   Basal Cell  of nose; Dr Martinique   Herpes zoster 2008   posterior thorax   Hypertension    Neurofibroma of back 2013    Dr Amy Martinique    Osteopenia 2016   T score -1.6 FRAX not calculated because of HRT   Psoriasis    Dr Amy Martinique   Thyroid nodule     Past Surgical History:  Procedure Laterality Date   ABDOMINAL HYSTERECTOMY      LSO  & appendectomy for endometrioma   @ age38 for ovarian cyst   APPENDECTOMY     cataract Bilateral Dec 2016;   not sure of date; wears readers only    COLONOSCOPY  multiple   OOPHORECTOMY     LSO for cyst   TONSILLECTOMY      Social History   Socioeconomic History   Marital status: Married    Spouse name: Not on file   Number of children: 2   Years of education: Not on file    Highest education level: Not on file  Occupational History   Not on file  Tobacco Use   Smoking status: Former   Smokeless tobacco: Never   Tobacco comments:    college only socially  Vaping Use   Vaping Use: Never used  Substance and Sexual Activity   Alcohol use: Yes    Alcohol/week: 6.0 standard drinks    Types: 6 Glasses of wine per week   Drug use: No   Sexual activity: Yes    Birth control/protection: Surgical    Comment: HYST-1st intercourse 75 yo-Fewer than 5 partners  Other Topics Concern   Not on file  Social History Narrative   Lives with husband x 45 years in a two story home.  Has 2 children.     Retired Education officer, museum in [phone].     Education: college.   Social Determinants of Health   Financial Resource Strain: Low Risk    Difficulty of Paying Living Expenses: Not hard at all  Food Insecurity: No Food Insecurity   Worried About Charity fundraiser in the Last Year: Never true   [place] in the Last Year: Never true  Transportation Needs: No Transportation Needs   Lack of Transportation (Medical): No   Lack of Transportation (Non-Medical): No  Physical Activity: Sufficiently Active   Days of Exercise per Week: 5 days   Minutes of Exercise per Session: 30 min  Stress: No Stress Concern Present   Feeling of Stress : Not at all  Social Connections: Socially Integrated   Frequency of Communication with Friends and Family: More than three times a week   Frequency of Social Gatherings with Friends and Family: More than three times a week   Attends Religious Services: More than 4 times per year   Active Member of [employer] or Organizations: Yes   Attends Music therapist: More than 4 times per year   Marital Status: Married    Family History  Problem Relation Age of Onset   Hypertension Father    Lung cancer Father        remote smoker, deceased 81   Hyperlipidemia Mother        CABG in late 51s   Hypothyroidism Mother    Macular  degeneration Mother    Hypertension Mother    Coronary artery disease Mother        bypass surgery   Stroke Mother 66       Deceased   Coronary artery  disease Maternal Grandmother    Asthma Maternal Uncle        2   Asthma Maternal Grandfather    Diabetes Neg Hx    Heart attack Neg Hx    Colon cancer Neg Hx     Review of Systems  Constitutional:  Negative for fever.  Respiratory:  Negative for cough, shortness of breath and wheezing.   Cardiovascular:  Negative for chest pain, palpitations and leg swelling.  Gastrointestinal:  Positive for constipation. Negative for abdominal pain and nausea.  Neurological:  Negative for light-headedness and headaches.      Objective:   Vitals:   10/10/20 1125  BP: 112/70  Pulse: 61  Temp: 98 F (36.7 C)  SpO2: 99%   BP Readings from Last 3 Encounters:  10/10/20 112/70  09/18/20 136/82  09/13/20 (!) 158/76   Wt Readings from Last 3 Encounters:  10/10/20 100 lb 12.8 oz (45.7 kg)  09/18/20 101 lb 12.8 oz (46.2 kg)  09/13/20 102 lb 9.6 oz (46.5 kg)   Body mass index is 19.05 kg/m.   Physical Exam    Constitutional: Appears well-developed and well-nourished. No distress.  HENT:  Head: Normocephalic and atraumatic.  Neck: Neck supple. No tracheal deviation present. No thyromegaly present.  No cervical lymphadenopathy Cardiovascular: Normal rate, regular rhythm and normal heart sounds.   No murmur heard. No carotid bruit .  No edema Pulmonary/Chest: Effort normal and breath sounds normal. No respiratory distress. No has no wheezes. No rales. Abdomen: Soft, nontender, nondistended Skin: Skin is warm and dry. Not diaphoretic.  Psychiatric: Normal mood and affect. Behavior is normal.      Assessment & Plan:    See Problem List for Assessment and Plan of chronic medical problems.    This visit occurred during the SARS-CoV-2 public health emergency.  Safety protocols were in place, including screening questions prior to the  visit, additional usage of staff PPE, and extensive cleaning of exam room while observing appropriate contact time as indicated for disinfecting solutions.

## 2020-10-10 ENCOUNTER — Ambulatory Visit (INDEPENDENT_AMBULATORY_CARE_PROVIDER_SITE_OTHER): Payer: PPO | Admitting: Internal Medicine

## 2020-10-10 ENCOUNTER — Encounter: Payer: Self-pay | Admitting: Internal Medicine

## 2020-10-10 ENCOUNTER — Other Ambulatory Visit: Payer: Self-pay

## 2020-10-10 DIAGNOSIS — K5904 Chronic idiopathic constipation: Secondary | ICD-10-CM

## 2020-10-10 DIAGNOSIS — I10 Essential (primary) hypertension: Secondary | ICD-10-CM

## 2020-10-10 DIAGNOSIS — F418 Other specified anxiety disorders: Secondary | ICD-10-CM

## 2020-10-10 DIAGNOSIS — K5900 Constipation, unspecified: Secondary | ICD-10-CM | POA: Insufficient documentation

## 2020-10-10 DIAGNOSIS — R0789 Other chest pain: Secondary | ICD-10-CM | POA: Insufficient documentation

## 2020-10-10 NOTE — Assessment & Plan Note (Signed)
Went to the ED 09/13/2020 for chest pain with radiation to the left jaw Work-up in the ED negative Pain thought to be musculoskeletal in nature and it did last a few days and then resolved No recurrence of chest pain or any other concerning symptoms Exercises regularly without issues No further evaluation necessary

## 2020-10-10 NOTE — Assessment & Plan Note (Signed)
Chronic Situational-intermittent anxiety Continue lorazepam 0.5 mg twice daily as needed

## 2020-10-10 NOTE — Patient Instructions (Addendum)
     Medications changes include :   none  Your prescription(s) have been submitted to your pharmacy. Please take as directed and contact our office if you believe you are having problem(s) with the medication(s).   Try prunes for the constipation

## 2020-10-10 NOTE — Assessment & Plan Note (Signed)
Chronic BP well controlled Continue hydrochlorothiazide 25 mg daily, metoprolol XL 25 mg daily

## 2020-10-10 NOTE — Assessment & Plan Note (Signed)
Chronic Has tried fiber supplement, align without much improvement Exercises regularly and drinks plenty of fluids and eats healthy Advise trying prunes on a daily basis to see if that helps Can also try a stool softener, magnesium supplement, smooth move tea She will continue to experiment to see what works well for her

## 2020-10-11 DIAGNOSIS — M542 Cervicalgia: Secondary | ICD-10-CM | POA: Diagnosis not present

## 2020-10-26 ENCOUNTER — Telehealth: Payer: Self-pay

## 2020-10-26 NOTE — Chronic Care Management (AMB) (Signed)
    Chronic Care Management Pharmacy Assistant   Name: Renee Santos  MRN: [id number] DOB: Sep 21, 1945  Renee Santos is an 75 y.o. year old female who presents for her initial CCM visit with the clinical pharmacist.   Recent office visits:  10/10/20 Billey Gosling MD PCP- pt was seen for Essential HTN. Pt was disc. Esradiol, and Methylprednisone. Follow up not noted.  09/18/20 Billey Gosling MD- pt was seen for Rash. Pt started on Methylprednisone Taper and advised to follow up prn.  Recent consult visits:  06/07/20 Marny Lowenstein NP Gynecology- Pt was seen for well female exam. Follow up in 1 year.  Hospital visits:  Medication Reconciliation was completed by comparing discharge summary, patient's EMR and Pharmacy list, and upon discussion with patient.  Admitted to the hospital on 09/13/20 due to Chest pain. Discharge date was 09/13/20. Discharged from [place]?Medications Started at [facility] Discharge:?? -started Lidocaine  Medication Changes at Hospital Discharge: -Changed none  Medications Discontinued at Hospital Discharge: -Stopped none   Medications that remain the same after Hospital Discharge:??  -All other medications will remain the same.    Medications: Outpatient Encounter Medications as of 10/26/2020  Medication Sig   BIOTIN FORTE PO Take 1 tablet by mouth.   Calcium Carbonate-Vit D-Min (CALTRATE PLUS PO) Take by mouth daily.     clobetasol cream (TEMOVATE) AB-123456789 % Apply 1 application topically 2 (two) times daily.   hydrochlorothiazide (HYDRODIURIL) 25 MG tablet Take 1/2 tablet by mouth daily   levothyroxine (SYNTHROID) 75 MCG tablet Take 1 tablet (75 mcg total) by mouth daily. Take 6 days a week only   lidocaine (LIDODERM) 5 % Place 1 patch onto the skin daily. Remove & Discard patch within 12 hours or as directed by MD   LORazepam (ATIVAN) 0.5 MG tablet Take 1 tablet by mouth twice daily as needed for anxiety   metoprolol succinate  (TOPROL-XL) 50 MG 24 hr tablet Take 1/2 tablet by mouth every day   multivitamin-lutein (OCUVITE-LUTEIN) CAPS capsule Take 1 capsule by mouth daily.   No facility-administered encounter medications on file as of 10/26/2020.    Current Medication List BIOTIN FORTE PO  Calcium Carbonate-Vit D-Min (CALTRATE PLUS PO)  hydrochlorothiazide (HYDRODIURIL) 25 MG tablet last filled 10/03/20 90 DS levothyroxine (SYNTHROID) 75 MCG tablet last filled 10/10/20 90 DS LORazepam (ATIVAN) 0.5 MG tablet last filled 10/04/20 8 DS metoprolol succinate (TOPROL-XL) 50 MG 24 hr tablet last filled 10/03/20 90 DS multivitamin-lutein (OCUVITE-LUTEIN) CAPS capsule   [place] Pharmacist Assistant [phone]

## 2020-10-28 ENCOUNTER — Other Ambulatory Visit: Payer: Self-pay

## 2020-10-28 ENCOUNTER — Encounter (HOSPITAL_COMMUNITY): Payer: Self-pay

## 2020-10-28 ENCOUNTER — Ambulatory Visit (HOSPITAL_COMMUNITY)
Admission: EM | Admit: 2020-10-28 | Discharge: 2020-10-28 | Disposition: A | Discharge status: Home / Self Care | Payer: PPO | Attending: Urgent Care | Admitting: Urgent Care

## 2020-10-28 ENCOUNTER — Telehealth: Payer: Self-pay | Admitting: Internal Medicine

## 2020-10-28 DIAGNOSIS — R350 Frequency of micturition: Secondary | ICD-10-CM | POA: Diagnosis not present

## 2020-10-28 DIAGNOSIS — Z20822 Contact with and (suspected) exposure to covid-19: Secondary | ICD-10-CM | POA: Insufficient documentation

## 2020-10-28 DIAGNOSIS — N3001 Acute cystitis with hematuria: Secondary | ICD-10-CM | POA: Diagnosis not present

## 2020-10-28 DIAGNOSIS — U071 COVID-19: Secondary | ICD-10-CM | POA: Diagnosis not present

## 2020-10-28 DIAGNOSIS — R509 Fever, unspecified: Secondary | ICD-10-CM | POA: Insufficient documentation

## 2020-10-28 LAB — POCT URINALYSIS DIPSTICK, ED / UC
Bilirubin Urine: NEGATIVE
Glucose, UA: NEGATIVE mg/dL
Ketones, ur: 15 mg/dL — AB
Nitrite: NEGATIVE
Protein, ur: NEGATIVE mg/dL
Specific Gravity, Urine: 1.025 (ref 1.005–1.030)
Urobilinogen, UA: 0.2 mg/dL (ref 0.0–1.0)
pH: 5.5 (ref 5.0–8.0)

## 2020-10-28 MED ORDER — ACETAMINOPHEN 325 MG PO TABS
ORAL_TABLET | ORAL | Status: AC
  Filled 2020-10-28: qty 2

## 2020-10-28 MED ORDER — MOLNUPIRAVIR EUA 200MG CAPSULE: 4.0000 | ORAL_CAPSULE | Freq: Two times a day (BID) | ORAL | 0 refills | Status: AC

## 2020-10-28 MED ORDER — CEPHALEXIN 500 MG PO CAPS: 500.0000 mg | ORAL_CAPSULE | Freq: Two times a day (BID) | ORAL | 0 refills | Status: DC

## 2020-10-28 MED ORDER — ACETAMINOPHEN 325 MG PO TABS
650.0000 mg | ORAL_TABLET | Freq: Once | ORAL | Status: AC
  Administered 2020-10-28: 650 mg via ORAL

## 2020-10-28 NOTE — ED Provider Notes (Signed)
[place]   MRN: [id number] DOB: 1945/10/13  Subjective:   Renee Santos is a 75 y.o. female presenting for acute onset this morning of urinary frequency, urinary urgency, dysuria, malodorous urine.  Patient also had a fever.  She tested herself for COVID-19 because she is had very close exposure to it from her husband.  He tested +3 days ago.  She denies any symptoms of cough, chest pain, shortness of breath.  No body aches, chills, headache, confusion.  Patient is COVID vaccinated and had a booster months ago.  Would like to have COVID antivirals.  No history of UTIs, kidney infections, kidney stones.  Patient tries to hydrate with water very well.    Current Facility-Administered Medications:    acetaminophen (TYLENOL) tablet 650 mg, 650 mg, Oral, Once, Jaynee Eagles, PA-C  Current Outpatient Medications:    BIOTIN FORTE PO, Take 1 tablet by mouth., Disp: , Rfl:    Calcium Carbonate-Vit D-Min (CALTRATE PLUS PO), Take by mouth daily.  , Disp: , Rfl:    clobetasol cream (TEMOVATE) [id number] %, Apply 1 application topically 2 (two) times daily., Disp: 45 g, Rfl: 1   hydrochlorothiazide (HYDRODIURIL) 25 MG tablet, Take 1/2 tablet by mouth daily, Disp: 45 tablet, Rfl: 3   levothyroxine (SYNTHROID) 75 MCG tablet, Take 1 tablet (75 mcg total) by mouth daily. Take 6 days a week only, Disp: 90 tablet, Rfl: 3   lidocaine (LIDODERM) 5 %, Place 1 patch onto the skin daily. Remove & Discard patch within 12 hours or as directed by MD, Disp: 15 patch, Rfl: 0   LORazepam (ATIVAN) 0.5 MG tablet, Take 1 tablet by mouth twice daily as needed for anxiety, Disp: 15 tablet, Rfl: 0   metoprolol succinate (TOPROL-XL) 50 MG 24 hr tablet, Take 1/2 tablet by mouth every day, Disp: 45 tablet, Rfl: 3   multivitamin-lutein (OCUVITE-LUTEIN) CAPS capsule, Take 1 capsule by mouth daily., Disp: , Rfl:    No Known Allergies  Past Medical History:  Diagnosis Date   Cancer ([place]) 2011   Basal Cell of nose; Dr  Martinique   Herpes zoster 2008   posterior thorax   Hypertension    Neurofibroma of back 2013    Dr Amy Martinique    Osteopenia 2016   T score -1.6 FRAX not calculated because of HRT   Psoriasis    Dr Amy Martinique   Thyroid nodule      Past Surgical History:  Procedure Laterality Date   ABDOMINAL HYSTERECTOMY      LSO  & appendectomy for endometrioma   @ age38 for ovarian cyst   APPENDECTOMY     cataract Bilateral Dec 2016;   not sure of date; wears readers only    COLONOSCOPY  multiple   OOPHORECTOMY     LSO for cyst   TONSILLECTOMY      Family History  Problem Relation Age of Onset   Hypertension Father    Lung cancer Father        remote smoker, deceased 67   Hyperlipidemia Mother        CABG in late 55s   Hypothyroidism Mother    Macular degeneration Mother    Hypertension Mother    Coronary artery disease Mother        bypass surgery   Stroke Mother 82       Deceased   Coronary artery disease Maternal Grandmother    Asthma Maternal Uncle        2  Asthma Maternal Grandfather    Diabetes Neg Hx    Heart attack Neg Hx    Colon cancer Neg Hx     Social History   Tobacco Use   Smoking status: Former   Smokeless tobacco: Never   Tobacco comments:    college only socially  Vaping Use   Vaping Use: Never used  Substance Use Topics   Alcohol use: Yes    Alcohol/week: 6.0 standard drinks    Types: 6 Glasses of wine per week   Drug use: No    ROS   Objective:   Vitals: BP 140/81 (BP Location: Left Arm)   Pulse 77   Temp (!) 101.9 F (38.8 C) (Oral)   Resp 17   SpO2 97%   Physical Exam Constitutional:      General: She is not in acute distress.    Appearance: Normal appearance. She is well-developed. She is not ill-appearing, toxic-appearing or diaphoretic.  HENT:     Head: Normocephalic and atraumatic.     Nose: Nose normal.     Mouth/Throat:     Mouth: Mucous membranes are moist.     Pharynx: Oropharynx is clear.  Eyes:     General: No  scleral icterus.       Right eye: No discharge.        Left eye: No discharge.     Extraocular Movements: Extraocular movements intact.     Conjunctiva/sclera: Conjunctivae normal.     Pupils: Pupils are equal, round, and reactive to light.  Cardiovascular:     Rate and Rhythm: Normal rate and regular rhythm.     Pulses: Normal pulses.     Heart sounds: Normal heart sounds. No murmur heard.   No friction rub. No gallop.  Pulmonary:     Effort: Pulmonary effort is normal. No respiratory distress.     Breath sounds: Normal breath sounds. No stridor. No wheezing, rhonchi or rales.  Abdominal:     General: Bowel sounds are normal. There is no distension.     Palpations: Abdomen is soft. There is no mass.     Tenderness: There is no abdominal tenderness. There is no right CVA tenderness, left CVA tenderness, guarding or rebound.  Skin:    General: Skin is warm and dry.     Findings: No rash.  Neurological:     General: No focal deficit present.     Mental Status: She is alert and oriented to person, place, and time.  Psychiatric:        Mood and Affect: Mood normal.        Behavior: Behavior normal.        Thought Content: Thought content normal.        Judgment: Judgment normal.    Results for orders placed or performed during the hospital encounter of 10/28/20 (from the past 24 hour(s))  POC Urinalysis dipstick     Status: Abnormal   Collection Time: 10/28/20  3:08 PM  Result Value Ref Range   Glucose, UA NEGATIVE NEGATIVE mg/dL   Bilirubin Urine NEGATIVE NEGATIVE   Ketones, ur 15 (A) NEGATIVE mg/dL   Specific Gravity, Urine 1.025 1.005 - 1.030   Hgb urine dipstick SMALL (A) NEGATIVE   pH 5.5 5.0 - 8.0   Protein, ur NEGATIVE NEGATIVE mg/dL   Urobilinogen, UA 0.2 0.0 - 1.0 mg/dL   Nitrite NEGATIVE NEGATIVE   Leukocytes,Ua SMALL (A) NEGATIVE    Assessment and Plan :   PDMP not reviewed  this encounter.  1. Urinary frequency   2. Fever, unspecified   3. Acute cystitis  with hematuria   4. COVID-19 virus infection   5. Close exposure to COVID-19 virus     Start Keflex to cover for acute cystitis, urine culture pending.  Recommended aggressive hydration, limiting urinary irritants.  Creatinine clearance is 30-32 using last creatinine level from August.  500 mg of Keflex twice daily for 5 days is appropriate.  Regarding her COVID-19 infection, will use molnupiravir at her request as this is what her husband who was prescribed and she feels more comfortable with this.  Counseled patient on potential for adverse effects with medications prescribed/recommended today, ER and return-to-clinic precautions discussed, patient verbalized understanding.    Jaynee Eagles, [place] 10/28/20 [phone]

## 2020-10-28 NOTE — ED Triage Notes (Signed)
Pt presents with very strong urine odor and inability to hold her bladder the past few days.  Pt is covid positive; tested positive this morning.

## 2020-10-30 ENCOUNTER — Ambulatory Visit: Payer: PPO

## 2020-10-30 LAB — URINE CULTURE: Culture: >=100000 — AB

## 2020-10-30 NOTE — Telephone Encounter (Signed)
[facility] FYI...   Caller states she started feeling poorly on Monday. Caller states she took her last temp orally 101. Denies SOB/Chest, Tired and cough.  Advised to call PCP within 24 hours. Patient understood but decided to go to Urgent Care.

## 2020-11-03 DIAGNOSIS — M542 Cervicalgia: Secondary | ICD-10-CM | POA: Diagnosis not present

## 2020-11-11 ENCOUNTER — Encounter: Payer: Self-pay | Admitting: Internal Medicine

## 2020-11-14 MED ORDER — NITROFURANTOIN MONOHYD MACRO 100 MG PO CAPS: 100.0000 mg | ORAL_CAPSULE | Freq: Two times a day (BID) | ORAL | 0 refills | Status: DC

## 2020-11-20 ENCOUNTER — Ambulatory Visit (INDEPENDENT_AMBULATORY_CARE_PROVIDER_SITE_OTHER): Payer: PPO

## 2020-11-20 ENCOUNTER — Other Ambulatory Visit: Payer: Self-pay

## 2020-11-20 DIAGNOSIS — Z23 Encounter for immunization: Secondary | ICD-10-CM | POA: Diagnosis not present

## 2020-11-20 DIAGNOSIS — E038 Other specified hypothyroidism: Secondary | ICD-10-CM

## 2020-11-20 DIAGNOSIS — I10 Essential (primary) hypertension: Secondary | ICD-10-CM

## 2020-11-20 DIAGNOSIS — M8580 Other specified disorders of bone density and structure, unspecified site: Secondary | ICD-10-CM

## 2020-11-20 DIAGNOSIS — F418 Other specified anxiety disorders: Secondary | ICD-10-CM

## 2020-11-20 NOTE — Progress Notes (Signed)
Chronic Care Management Pharmacy Note  11/20/2020 Name:  Renee Santos MRN:  [id number] DOB:  1945-05-25  Summary: - patient is doing well, has one more dose of macrobid for recent UTI she has been dealing with to complete her course of therapy -Notes that she has not been checking BP at home recently but does have a BP cuff at home - most recent checks in office were in range -Has stopped estradiol completely within the last few months - is noticing at times having issues with sleep / night sweats, manageable at this time  -TSH in range with last check  -Patient notes that anxiety is under control with current prn ativan rx   Recommendations/Changes made from today's visit: -Recommending no changes at this time - patient to begin checking blood pressure at least once weekly, to reach out should BP average >140/90 -Patient to continue exercise - walking 6-10 miles daily, will reach out with any issues or concerns regarding her medications   Subjective: Renee Santos is an 75 y.o. year old female who is a primary patient of Burns, Claudina Lick, MD.  The CCM team was consulted for assistance with disease management and care coordination needs.    Engaged with patient face to face for initial visit in response to provider referral for pharmacy case management and/or care coordination services.   Consent to Services:  The patient was given the following information about Chronic Care Management services today, agreed to services, and gave verbal consent: 1. CCM service includes personalized support from designated clinical staff supervised by the primary care provider, including individualized plan of care and coordination with other care providers 2. 24/7 contact phone numbers for assistance for urgent and routine care needs. 3. Service will only be billed when office clinical staff spend 20 minutes or more in a month to coordinate care. 4. Only one practitioner may furnish and bill the service in a  calendar month. 5.The patient may stop CCM services at any time (effective at the end of the month) by phone call to the office staff. 6. The patient will be responsible for cost sharing (co-pay) of up to 20% of the service fee (after annual deductible is met). Patient agreed to services and consent obtained.  Patient Care Team: Binnie Rail, MD as PCP - General (Internal Medicine) Martinique, Amy, MD as Consulting Physician (Dermatology) Phineas Real, Belinda Block, MD (Inactive) as Consulting Physician (Gynecology) , Darnelle Maffucci, [facility] as Pharmacist (Pharmacist) Calvert Cantor, MD as Consulting Physician (Ophthalmology) Sheral Flow, LPN as Licensed Practical Nurse (Internal Medicine)  Recent office visits:  10/10/20 Billey Gosling MD PCP- pt was seen for Essential HTN. Pt was disc. Estradiol   09/18/20 Billey Gosling MD- pt was seen for Rash. Pt started on Methylprednisone Taper and advised to follow up prn.   Recent consult visits:  06/07/20 Marny Lowenstein NP Gynecology- Pt was seen for well female exam. Follow up in 1 year.  Follows with Dr. Barbaraann Barthel for physical therapy    Hospital visits:   10/28/2020 - Urgent care -  UTI symptoms/ COVID 19 symptoms - prescribed cephalexin 500mg  BID x 5 days, amd malnupiravir   Medication Reconciliation was completed by comparing discharge summary, patient's EMR and Pharmacy list, and upon discussion with patient.   Admitted to the hospital on 09/13/20 due to Chest pain. Discharge date was 09/13/20. Discharged from [facility]?Medications Started at [facility] Discharge:?? -started Lidocaine 5% patch -  applied 1 patch daily 21 hours on and 12 hours off   Medication Changes at Hospital Discharge: -Changed none   Medications Discontinued at Hospital Discharge: -Stopped none    Medications that remain the same after Hospital Discharge:??  -All other medications will remain the same.    Objective:  Lab Results   Component Value Date   CREATININE 1.20 (H) 09/13/2020   BUN 22 09/13/2020   GFR 53.57 (L) 03/07/2020   GFRNONAA 48 (L) 09/13/2020   GFRAA 82 (L) 01/21/2012   NA 134 (L) 09/13/2020   K 3.7 09/13/2020   CALCIUM 9.1 09/13/2020   CO2 24 09/13/2020   GLUCOSE 103 (H) 09/13/2020    Lab Results  Component Value Date/Time   HGBA1C 5.3 02/02/2019 02:15 PM   HGBA1C 5.4 01/27/2018 09:00 AM   GFR 53.57 (L) 03/07/2020 11:24 AM   GFR 50.92 (L) 08/03/2019 03:36 PM    Last diabetic Eye exam:  No results found for: HMDIABEYEEXA  Last diabetic Foot exam:  No results found for: HMDIABFOOTEX   Lab Results  Component Value Date   CHOL 203 (H) 02/02/2019   HDL 98.00 02/02/2019   LDLCALC 88 02/02/2019   LDLDIRECT 96.0 11/06/2010   TRIG 85.0 02/02/2019   CHOLHDL 2 02/02/2019    Hepatic Function Latest Ref Rng & Units 03/07/2020 08/03/2019 02/02/2019  Total Protein 6.0 - 8.3 g/dL 7.1 6.9 6.9  Albumin 3.5 - 5.2 g/dL 4.1 4.2 3.9  AST 0 - 37 U/L $Remo'24 19 20  'hwidJ$ ALT 0 - 35 U/L $Remo'19 15 13  'mjfOr$ Alk Phosphatase 39 - 117 U/L 53 49 57  Total Bilirubin 0.2 - 1.2 mg/dL 0.6 0.5 0.6  Bilirubin, Direct 0.0 - 0.3 mg/dL - - -    Lab Results  Component Value Date/Time   TSH 2.07 09/14/2020 11:08 AM   TSH 0.26 (L) 03/07/2020 11:24 AM   FREET4 0.7 04/21/2006 11:19 AM   FREET4 0.6 03/19/2006 09:31 AM    CBC Latest Ref Rng & Units 09/13/2020 08/03/2019 02/02/2019  WBC 4.0 - 10.5 K/uL 6.6 4.8 5.6  Hemoglobin 12.0 - 15.0 g/dL 13.4 13.7 12.8  Hematocrit 36.0 - 46.0 % 39.5 39.8 38.1  Platelets 150 - 400 K/uL 206 186.0 258.0    Lab Results  Component Value Date/Time   VD25OH 57.51 03/07/2020 11:24 AM   VD25OH 53.29 12/22/2014 09:51 AM    Clinical ASCVD: No  The 10-year ASCVD risk score (Arnett DK, et al., 2019) is: 19.6%   Values used to calculate the score:     Age: 75 years     Sex: Female     Is Non-Hispanic African American: No     Diabetic: No     Tobacco smoker: No     Systolic Blood Pressure: 478  mmHg     Is BP treated: Yes     HDL Cholesterol: 98 mg/dL     Total Cholesterol: 203 mg/dL    Depression screen [facility] 2/9 10/10/2020 09/13/2020 06/17/2019  Decreased Interest 0 0 0  Down, Depressed, Hopeless 0 0 0  PHQ - 2 Score 0 0 0  Altered sleeping 0 - -  Tired, decreased energy 0 - -  Change in appetite 0 - -  Feeling bad or failure about yourself  0 - -  Trouble concentrating 0 - -  Moving slowly or fidgety/restless 0 - -  Suicidal thoughts 0 - -  PHQ-9 Score 0 - -  Difficult doing work/chores Not difficult at all - -  Social History   Tobacco Use  Smoking Status Former  Smokeless Tobacco Never  Tobacco Comments   college only socially   BP Readings from Last 3 Encounters:  10/28/20 140/81  10/10/20 112/70  09/18/20 136/82   Pulse Readings from Last 3 Encounters:  10/28/20 77  10/10/20 61  09/18/20 68   Wt Readings from Last 3 Encounters:  10/10/20 100 lb 12.8 oz (45.7 kg)  09/18/20 101 lb 12.8 oz (46.2 kg)  09/13/20 102 lb 9.6 oz (46.5 kg)   BMI Readings from Last 3 Encounters:  10/10/20 19.05 kg/m  09/18/20 19.23 kg/m  09/13/20 19.39 kg/m    Assessment/Interventions: Review of patient past medical history, allergies, medications, health status, including review of consultants reports, laboratory and other test data, was performed as part of comprehensive evaluation and provision of chronic care management services.   SDOH:  (Social Determinants of Health) assessments and interventions performed: Yes  SDOH Screenings   Alcohol Screen: Low Risk    Last Alcohol Screening Score (AUDIT): 4  Depression (PHQ2-9): Low Risk    PHQ-2 Score: 0  Financial Resource Strain: Low Risk    Difficulty of Paying Living Expenses: Not hard at all  Food Insecurity: No Food Insecurity   Worried About Charity fundraiser in the Last Year: Never true   Ran Out of Food in the Last Year: Never true  Housing: Low Risk    Last Housing Risk Score: 0  Physical Activity:  Sufficiently Active   Days of Exercise per Week: 5 days   Minutes of Exercise per Session: 30 min  Social Connections: Engineer, building services of Communication with Friends and Family: More than three times a week   Frequency of Social Gatherings with Friends and Family: More than three times a week   Attends Religious Services: More than 4 times per year   Active Member of [employer] or Organizations: Yes   Attends Music therapist: More than 4 times per year   Marital Status: Married  Stress: No Stress Concern Present   Feeling of Stress : Not at all  Tobacco Use: Medium Risk   Smoking Tobacco Use: Former   Smokeless Tobacco Use: Never  Transportation Needs: No Data processing manager (Medical): No   Lack of Transportation (Non-Medical): No    CCM Care Plan  No Known Allergies  Medications Reviewed Today     Reviewed by Tomasa Blase, [facility] (Pharmacist) on 11/20/20 at 1124  Med List Status: <None>   Medication Order Taking? Sig Documenting Provider Last Dose Status Informant  BIOTIN FORTE PO [id number] Yes Take 1 tablet by mouth. [provider] Taking Active Self  Calcium Carbonate-Vit D-Min (CALTRATE PLUS PO) [id number] Yes Take by mouth daily.   [provider] Taking Active Self  clobetasol cream (TEMOVATE) 0.05 % [id number] Yes Apply 1 application topically 2 (two) times daily.  Patient taking differently: Apply 1 application topically 2 (two) times daily as needed.   Binnie Rail, MD Taking Active   diphenhydrAMINE (BENADRYL) 25 mg capsule [id number] Yes Take 25-50 mg by mouth at bedtime. Taking as needed, typically 3 times as week [provider] Taking Active   hydrochlorothiazide (HYDRODIURIL) 25 MG tablet [id number] Yes Take 1/2 tablet by mouth daily Burns, Claudina Lick, MD Taking Active   levothyroxine (SYNTHROID) 75 MCG tablet [id number] Yes Take 1 tablet (75 mcg total) by mouth daily. Take 6 days a week only  Burns, Stacy J,  MD Taking Active   LORazepam (ATIVAN) 0.5 MG tablet [id number] Yes Take 1 tablet by mouth twice daily as needed for anxiety Burns, Claudina Lick, MD Taking Active   metoprolol succinate (TOPROL-XL) 50 MG 24 hr tablet [id number] Yes Take 1/2 tablet by mouth every day Binnie Rail, MD Taking Active   multivitamin-lutein [facility]) CAPS capsule [id number] Yes Take 1 capsule by mouth daily. [provider] Taking Active Self  nitrofurantoin, macrocrystal-monohydrate, (MACROBID) 100 MG capsule [id number] Yes Take 1 capsule (100 mg total) by mouth 2 (two) times daily. Binnie Rail, MD Taking Active             Patient Active Problem List   Diagnosis Date Noted   Atypical chest pain 10/10/2020   Constipation 10/10/2020   Rash 09/18/2020   Fecal incontinence 08/03/2019   Paronychia of great toe, left 10/08/2016   Hyperglycemia 07/16/2016   Dizziness 07/16/2016   Lightheadedness 07/16/2016   Calcific tendonitis of right hand 05/01/2015   Herpes simplex 05/01/2015   Situational anxiety 05/01/2015   Essential hypertension 03/01/2008   Osteopenia 03/01/2008   THYROID NODULE 01/13/2007   Hypothyroidism 01/13/2007   HYPERLIPIDEMIA 01/13/2007   PSORIASIS 01/13/2007    Immunization History  Administered Date(s) Administered   Fluad Quad(high Dose 65+) 10/07/2018, 11/20/2020   H1N1 03/01/2008   Hepatitis A 07/05/1997, 04/19/2004   Hepatitis B 05/30/2008, 07/05/2008, 03/27/2011   Influenza Split 12/04/2010, 11/05/2011   Influenza, High Dose Seasonal PF 12/27/2015, 10/08/2016, 11/03/2019   Influenza,inj,Quad PF,6+ Mos 12/03/2012, 12/15/2013, 12/21/2014, 12/01/2017   Influenza-Unspecified 11/11/2017   Meningococcal Conjugate 07/05/2008   PFIZER(Purple Top)SARS-COV-2 Vaccination 03/08/2019, 03/29/2019, 11/19/2019, 05/19/2020   Pneumococcal Conjugate-13 06/15/2014   Pneumococcal Polysaccharide-23 04/13/2013   Tdap 04/21/2017   Tetanus 02/21/2014   Typhoid  Inactivated 04/19/2004, 04/21/2017   Yellow Fever 05/30/2008   Zoster, Live 02/18/2014    Conditions to be addressed/monitored:  Hypertension, Hypothyroidism, Anxiety, and Osteopenia  Care Plan : CCM Care Plan  Updates made by Tomasa Blase, RPH since 11/20/2020 12:00 AM     Problem: HTN, Hypothyroidism, Osteopenia, Anxiety   Priority: High  Onset Date: 11/20/2020     Long-Range Goal: Disease Management   Start Date: 11/20/2020  Expected End Date: 05/21/2021  This Visit's Progress: On track  Priority: High  Note:   Current Barriers:  Unable to independently monitor therapeutic efficacy  Pharmacist Clinical Goal(s):  Patient will achieve adherence to monitoring guidelines and medication adherence to achieve therapeutic efficacy maintain control of blood pressure, hypothyroidism, and osteopenia as evidenced by BP logs, next TSH level, and next  BMD  through collaboration with PharmD and provider.   Interventions: 1:1 collaboration with Binnie Rail, MD regarding development and update of comprehensive plan of care as evidenced by provider attestation and co-signature Inter-disciplinary care team collaboration (see longitudinal plan of care) Comprehensive medication review performed; medication list updated in electronic medical record  Hypertension (BP goal <130/80) -Controlled -Current treatment: Hydrochlorothiazide 25mg  - 1/2 tablet daily  Metoprolol succinate 25mg  - 1/2 tablet daily  -Medications previously tried: n/a  -Current home readings: n/a - patient has blood pressure cuff at home but has not been checking recently  BP Readings from Last 3 Encounters:  10/28/20 140/81  10/10/20 112/70  09/18/20 136/82  -Current dietary habits: reports to eating a sodium reduced diet  -Current exercise habits: walking 6-10 miles daily  -Denies hypotensive/hypertensive symptoms -Educated on BP goals and benefits of medications for prevention of heart attack, stroke and  kidney  damage; Daily salt intake goal < 2300 mg; Exercise goal of 150 minutes per week; Importance of home blood pressure monitoring; Proper BP monitoring technique; -Counseled to monitor BP at home at least once ewekly, document, and provide log at future appointments -Counseled on diet and exercise extensively Recommended to continue current medication  Anxiety (Goal: Prevention of anxiety attacks) -Controlled -Current treatment: Lorazepam 0.5mg  - 1 tablet twice daily as needed  -Medications previously tried/failed: n/a -GAD7: 0 -Educated on Benefits of medication for symptom control Benefits of cognitive-behavioral therapy with or without medication -Recommended to continue current medication  Osteopenia (Goal prevention of fractures / disease progression) -Controlled -Last DEXA Scan: 02/23/20   T-Score right femoral neck: -1.3  T-Score left femoral neck: -1.3  T-Score lumbar spine: -1.4  10-year probability of major osteoporotic fracture: 14%  10-year probability of hip fracture: 4.5% (last FRAX calculation completed while patient was till taking estradiol) -Current treatment  Calcium Carbonate 600mg / Vitamin D 800units - 1 tablet daily  -Medications previously tried: n/a  -Recommend 1200 mg of calcium daily from dietary and supplemental sources. Recommend weight-bearing and muscle strengthening exercises for building and maintaining bone density. -Counseled on diet and exercise extensively Recommended to continue current medication  Hypothyroidism (Goal: Maintenance of euthyroid levels) -Controlled Lab Results  Component Value Date   TSH 2.07 09/14/2020  -Current treatment  Levothyroxine 53mcg - 1 tablet daily x 6 days weekly  -Medications previously tried: n/a   -Recommended to continue current medication  Health Maintenance -Vaccine gaps: shingrix - patient completed influenza vaccine today -Current therapy:  Biotin Forte - 1 tablet daily  Ocuvite-Lutein - 1  capsule daily  Diphenhydramine 25mg  - 1-2 capsules nightly at bedtime as needed  Clobetasol 0.05% cream- applied twice daily as needed  -Educated on Cost vs benefit of each product must be carefully weighed by individual consumer -Patient is satisfied with current therapy and denies issues -Recommended to continue current medication   Patient Goals/Self-Care Activities Patient will:  - take medications as prescribed check blood pressure at least once weekly, document, and provide at future appointments target a minimum of 150 minutes of moderate intensity exercise weekly  Follow Up Plan: Telephone follow up appointment with care management team member scheduled for: 6 months The patient has been provided with contact information for the care management team and has been advised to call with any health related questions or concerns.        Medication Assistance: None required.  Patient affirms current coverage meets needs.   Patient's preferred pharmacy is:  [facility] [address] [place] - [address] [place] [address] Phone: [phone] Fax: [phone]  [facility]) - [place], [place] [address] Phone: [phone] Fax: [phone]   Uses pill box? Yes Pt endorses 100% compliance  Care Plan and Follow Up Patient Decision:  Patient agrees to Care Plan and Follow-up.  Plan: Telephone follow up appointment with care management team member scheduled for:  6 months and The patient has been provided with contact information for the care management team and has been advised to call with any health related questions or concerns.   Tomasa Blase, PharmD Clinical Pharmacist, [place]

## 2020-11-20 NOTE — Patient Instructions (Signed)
Renee Santos,  It was great to talk to you today!  Please call me with any questions or concerns.  Visit Information   PATIENT GOALS:   Goals Addressed             This Visit's Progress    Track and Manage My Blood Pressure-Hypertension       Timeframe:  Long-Range Goal Priority:  High Start Date:  11/20/2020                          Expected End Date: 05/21/2021                      Follow Up Date 05/21/2021   - check blood pressure weekly - choose a place to take my blood pressure (home, clinic or office, retail store) - write blood pressure results in a log or diary    Why is this important?   You won't feel high blood pressure, but it can still hurt your blood vessels.  High blood pressure can cause heart or kidney problems. It can also cause a stroke.  Making lifestyle changes like losing a little weight or eating less salt will help.  Checking your blood pressure at home and at different times of the day can help to control blood pressure.  If the doctor prescribes medicine remember to take it the way the doctor ordered.  Call the office if you cannot afford the medicine or if there are questions about it.          Consent to CCM Services: Renee Santos was given information about Chronic Care Management services including:  CCM service includes personalized support from designated clinical staff supervised by her physician, including individualized plan of care and coordination with other care providers 24/7 contact phone numbers for assistance for urgent and routine care needs. Service will only be billed when office clinical staff spend 20 minutes or more in a month to coordinate care. Only one practitioner may furnish and bill the service in a calendar month. The patient may stop CCM services at any time (effective at the end of the month) by phone call to the office staff. The patient will be responsible for cost sharing (co-pay) of up to 20% of the service fee  (after annual deductible is met).  Patient agreed to services and verbal consent obtained.   Patient verbalizes understanding of instructions provided today and agrees to view in [place].   Telephone follow up appointment with care management team member scheduled for: 6 months The patient has been provided with contact information for the care management team and has been advised to call with any health related questions or concerns.   Tomasa Blase, PharmD Clinical Pharmacist, [place]   CLINICAL CARE PLAN: Patient Care Plan: CCM Care Plan     Problem Identified: HTN, Hypothyroidism, Osteopenia, Anxiety   Priority: High  Onset Date: 11/20/2020     Long-Range Goal: Disease Management   Start Date: 11/20/2020  Expected End Date: 05/21/2021  This Visit's Progress: On track  Priority: High  Note:   Current Barriers:  Unable to independently monitor therapeutic efficacy  Pharmacist Clinical Goal(s):  Patient will achieve adherence to monitoring guidelines and medication adherence to achieve therapeutic efficacy maintain control of blood pressure, hypothyroidism, and osteopenia as evidenced by BP logs, next TSH level, and next  BMD  through collaboration with PharmD and provider.   Interventions: 1:1  collaboration with Burns, Stacy J, MD regarding development and update of comprehensive plan of care as evidenced by provider attestation and co-signature Inter-disciplinary care team collaboration (see longitudinal plan of care) Comprehensive medication review performed; medication list updated in electronic medical record  Hypertension (BP goal <130/80) -Controlled -Current treatment: Hydrochlorothiazide 25mg - 1/2 tablet daily  Metoprolol succinate 25mg - 1/2 tablet daily  -Medications previously tried: n/a  -Current home readings: n/a - patient has blood pressure cuff at home but has not been checking recently  BP Readings from Last 3 Encounters:  10/28/20  140/81  10/10/20 112/70  09/18/20 136/82  -Current dietary habits: reports to eating a sodium reduced diet  -Current exercise habits: walking 6-10 miles daily  -Denies hypotensive/hypertensive symptoms -Educated on BP goals and benefits of medications for prevention of heart attack, stroke and kidney damage; Daily salt intake goal < 2300 mg; Exercise goal of 150 minutes per week; Importance of home blood pressure monitoring; Proper BP monitoring technique; -Counseled to monitor BP at home at least once ewekly, document, and provide log at future appointments -Counseled on diet and exercise extensively Recommended to continue current medication  Anxiety (Goal: Prevention of anxiety attacks) -Controlled -Current treatment: Lorazepam 0.5mg - 1 tablet twice daily as needed  -Medications previously tried/failed: n/a -GAD7: 0 -Educated on Benefits of medication for symptom control Benefits of cognitive-behavioral therapy with or without medication -Recommended to continue current medication  Osteopenia (Goal prevention of fractures / disease progression) -Controlled -Last DEXA Scan: 02/23/20   T-Score right femoral neck: -1.3  T-Score left femoral neck: -1.3  T-Score lumbar spine: -1.4  10-year probability of major osteoporotic fracture: 14%  10-year probability of hip fracture: 4.5% (last FRAX calculation completed while patient was till taking estradiol) -Current treatment  Calcium Carbonate 600mg/ Vitamin D 800units - 1 tablet daily  -Medications previously tried: n/a  -Recommend 1200 mg of calcium daily from dietary and supplemental sources. Recommend weight-bearing and muscle strengthening exercises for building and maintaining bone density. -Counseled on diet and exercise extensively Recommended to continue current medication  Hypothyroidism (Goal: Maintenance of euthyroid levels) -Controlled Lab Results  Component Value Date   TSH 2.07 09/14/2020  -Current treatment   Levothyroxine 75mcg - 1 tablet daily x 6 days weekly  -Medications previously tried: n/a   -Recommended to continue current medication  Health Maintenance -Vaccine gaps: shingrix - patient completed influenza vaccine today -Current therapy:  Biotin Forte - 1 tablet daily  Ocuvite-Lutein - 1 capsule daily  Diphenhydramine 25mg - 1-2 capsules nightly at bedtime as needed  Clobetasol 0.05% cream- applied twice daily as needed  -Educated on Cost vs benefit of each product must be carefully weighed by individual consumer -Patient is satisfied with current therapy and denies issues -Recommended to continue current medication   Patient Goals/Self-Care Activities Patient will:  - take medications as prescribed check blood pressure at least once weekly, document, and provide at future appointments target a minimum of 150 minutes of moderate intensity exercise weekly  Follow Up Plan: Telephone follow up appointment with care management team member scheduled for: 6 months The patient has been provided with contact information for the care management team and has been advised to call with any health related questions or concerns.       

## 2020-11-21 DIAGNOSIS — D692 Other nonthrombocytopenic purpura: Secondary | ICD-10-CM | POA: Diagnosis not present

## 2020-11-21 DIAGNOSIS — D225 Melanocytic nevi of trunk: Secondary | ICD-10-CM | POA: Diagnosis not present

## 2020-11-21 DIAGNOSIS — D2271 Melanocytic nevi of right lower limb, including hip: Secondary | ICD-10-CM | POA: Diagnosis not present

## 2020-11-21 DIAGNOSIS — L821 Other seborrheic keratosis: Secondary | ICD-10-CM | POA: Diagnosis not present

## 2020-11-21 DIAGNOSIS — L400 Psoriasis vulgaris: Secondary | ICD-10-CM | POA: Diagnosis not present

## 2020-12-10 ENCOUNTER — Other Ambulatory Visit: Payer: Self-pay | Admitting: Internal Medicine

## 2020-12-10 DIAGNOSIS — I10 Essential (primary) hypertension: Secondary | ICD-10-CM

## 2020-12-10 MED ORDER — HYDROCHLOROTHIAZIDE 25 MG PO TABS: ORAL_TABLET | ORAL | 2 refills | Status: DC

## 2020-12-10 MED ORDER — METOPROLOL SUCCINATE ER 50 MG PO TB24: ORAL_TABLET | ORAL | 2 refills | Status: DC

## 2020-12-11 DIAGNOSIS — I10 Essential (primary) hypertension: Secondary | ICD-10-CM | POA: Diagnosis not present

## 2020-12-11 DIAGNOSIS — E038 Other specified hypothyroidism: Secondary | ICD-10-CM

## 2020-12-11 DIAGNOSIS — M542 Cervicalgia: Secondary | ICD-10-CM | POA: Diagnosis not present

## 2020-12-19 ENCOUNTER — Other Ambulatory Visit: Payer: Self-pay | Admitting: Internal Medicine

## 2020-12-21 ENCOUNTER — Encounter: Payer: Self-pay | Admitting: Internal Medicine

## 2020-12-21 DIAGNOSIS — R194 Change in bowel habit: Secondary | ICD-10-CM

## 2021-01-15 DIAGNOSIS — H0102A Squamous blepharitis right eye, upper and lower eyelids: Secondary | ICD-10-CM | POA: Diagnosis not present

## 2021-01-15 DIAGNOSIS — H04123 Dry eye syndrome of bilateral lacrimal glands: Secondary | ICD-10-CM | POA: Diagnosis not present

## 2021-01-15 DIAGNOSIS — H26491 Other secondary cataract, right eye: Secondary | ICD-10-CM | POA: Diagnosis not present

## 2021-01-15 DIAGNOSIS — H43813 Vitreous degeneration, bilateral: Secondary | ICD-10-CM | POA: Diagnosis not present

## 2021-01-18 DIAGNOSIS — M542 Cervicalgia: Secondary | ICD-10-CM | POA: Diagnosis not present

## 2021-01-26 ENCOUNTER — Encounter: Payer: Self-pay | Admitting: Internal Medicine

## 2021-01-26 MED ORDER — LORAZEPAM 0.5 MG PO TABS: 0.5000 mg | ORAL_TABLET | Freq: Two times a day (BID) | ORAL | 0 refills | Status: DC | PRN

## 2021-01-29 DIAGNOSIS — M542 Cervicalgia: Secondary | ICD-10-CM | POA: Diagnosis not present

## 2021-02-20 ENCOUNTER — Encounter (INDEPENDENT_AMBULATORY_CARE_PROVIDER_SITE_OTHER): Payer: Self-pay

## 2021-02-21 DIAGNOSIS — M542 Cervicalgia: Secondary | ICD-10-CM | POA: Diagnosis not present

## 2021-02-26 NOTE — Progress Notes (Signed)
Subjective:    Patient ID: Renee Santos, female    DOB: 12/23/1945, 76 y.o.   MRN: [id number]  This visit occurred during the SARS-CoV-2 public health emergency.  Safety protocols were in place, including screening questions prior to the visit, additional usage of staff PPE, and extensive cleaning of exam room while observing appropriate contact time as indicated for disinfecting solutions.    HPI The patient is here for an acute visit.  ? UTI:  Her symptoms started  several days ago.  She states dark yellow urine, odor,urinary  frequency, incontinence, urinary urgency.   No hematuria, abdominal pain, back pain, nausea, fever.    Last urine cx - 10/2020: E coli - sensitive to bactrim, macrobid, cipro  In December went into urgent care and had a uti. She never really feels like it went away.     Medications and allergies reviewed with patient and updated if appropriate.  Patient Active Problem List   Diagnosis Date Noted   Atypical chest pain 10/10/2020   Constipation 10/10/2020   Rash 09/18/2020   Fecal incontinence 08/03/2019   Paronychia of great toe, left 10/08/2016   Hyperglycemia 07/16/2016   Dizziness 07/16/2016   Lightheadedness 07/16/2016   Calcific tendonitis of right hand 05/01/2015   Herpes simplex 05/01/2015   Situational anxiety 05/01/2015   Essential hypertension 03/01/2008   Osteopenia 03/01/2008   THYROID NODULE 01/13/2007   Hypothyroidism 01/13/2007   HYPERLIPIDEMIA 01/13/2007   PSORIASIS 01/13/2007    Current Outpatient Medications on File Prior to Visit  Medication Sig Dispense Refill   BIOTIN FORTE PO Take 1 tablet by mouth.     Calcium Carbonate-Vit D-Min (CALTRATE PLUS PO) Take by mouth daily.       clobetasol cream (TEMOVATE) 3.79 % Apply 1 application topically 2 (two) times daily. (Patient taking differently: Apply 1 application topically 2 (two) times daily as needed.) 45 g 1   diphenhydrAMINE (BENADRYL) 25 mg capsule Take 25-50 mg by  mouth at bedtime. Taking as needed, typically 3 times as week     hydrochlorothiazide (HYDRODIURIL) 25 MG tablet Take 1/2 tablet by mouth daily 45 tablet 2   levothyroxine (SYNTHROID) 75 MCG tablet Take 1 tablet by mouth 6 days a week. (new directions) 77 tablet 2   LORazepam (ATIVAN) 0.5 MG tablet Take 1 tablet (0.5 mg total) by mouth 2 (two) times daily as needed. for anxiety 15 tablet 0   metoprolol succinate (TOPROL-XL) 50 MG 24 hr tablet Take 1/2 tablet by mouth every day 45 tablet 2   multivitamin-lutein (OCUVITE-LUTEIN) CAPS capsule Take 1 capsule by mouth daily.     valACYclovir (VALTREX) 1000 MG tablet Take 1,000 mg by mouth daily.     No current facility-administered medications on file prior to visit.    Past Medical History:  Diagnosis Date   Cancer [facility]) 2011   Basal Cell of nose; Dr [place]ique   Herpes zoster 2008   posterior thorax   Hypertension    Neurofibroma of back 2013    Dr Amy Martinique    Osteopenia 2016   T score -1.6 FRAX not calculated because of HRT   Psoriasis    Dr Amy Martinique   Thyroid nodule     Past Surgical History:  Procedure Laterality Date   ABDOMINAL HYSTERECTOMY      LSO  & appendectomy for endometrioma   @ age38 for ovarian cyst   APPENDECTOMY     cataract Bilateral Dec 2016;   not  sure of date; wears readers only    COLONOSCOPY  multiple   OOPHORECTOMY     LSO for cyst   TONSILLECTOMY      Social History   Socioeconomic History   Marital status: Married    Spouse name: Not on file   Number of children: 2   Years of education: Not on file   Highest education level: Not on file  Occupational History   Not on file  Tobacco Use   Smoking status: Former   Smokeless tobacco: Never   Tobacco comments:    college only socially  Vaping Use   Vaping Use: Never used  Substance and Sexual Activity   Alcohol use: Yes    Alcohol/week: 6.0 standard drinks    Types: 6 Glasses of wine per week   Drug use: No   Sexual activity: Yes     Birth control/protection: Surgical    Comment: HYST-1st intercourse 76 yo-Fewer than 5 partners  Other Topics Concern   Not on file  Social History Narrative   Lives with husband x 45 years in a two story home.  Has 2 children.     Retired Education officer, museum in [phone].     Education: college.   Social Determinants of Health   Financial Resource Strain: Low Risk    Difficulty of Paying Living Expenses: Not hard at all  Food Insecurity: No Food Insecurity   Worried About Charity fundraiser in the Last Year: Never true   [place] in the Last Year: Never true  Transportation Needs: No Transportation Needs   Lack of Transportation (Medical): No   Lack of Transportation (Non-Medical): No  Physical Activity: Sufficiently Active   Days of Exercise per Week: 5 days   Minutes of Exercise per Session: 30 min  Stress: No Stress Concern Present   Feeling of Stress : Not at all  Social Connections: Socially Integrated   Frequency of Communication with Friends and Family: More than three times a week   Frequency of Social Gatherings with Friends and Family: More than three times a week   Attends Religious Services: More than 4 times per year   Active Member of [employer] or Organizations: Yes   Attends Music therapist: More than 4 times per year   Marital Status: Married    Family History  Problem Relation Age of Onset   Hypertension Father    Lung cancer Father        remote smoker, deceased 21   Hyperlipidemia Mother        CABG in late 38s   Hypothyroidism Mother    Macular degeneration Mother    Hypertension Mother    Coronary artery disease Mother        bypass surgery   Stroke Mother 74       Deceased   Coronary artery disease Maternal Grandmother    Asthma Maternal Uncle        2   Asthma Maternal Grandfather    Diabetes Neg Hx    Heart attack Neg Hx    Colon cancer Neg Hx     Review of Systems     Objective:   Vitals:   02/27/21 1427  BP: 102/72   Pulse: 71  Temp: 98.1 F (36.7 C)  SpO2: 99%   BP Readings from Last 3 Encounters:  02/27/21 102/72  10/28/20 140/81  10/10/20 112/70   Wt Readings from Last 3 Encounters:  02/27/21 107 lb (  48.5 kg)  10/10/20 100 lb 12.8 oz (45.7 kg)  09/18/20 101 lb 12.8 oz (46.2 kg)   Body mass index is 20.22 kg/m.   Physical Exam    Constitutional:      General: She is not in acute distress.    Appearance: Normal appearance. She is not ill-appearing.  HENT:     Head: Normocephalic and atraumatic.  Abdominal:     General: There is no distension.     Palpations: Abdomen is soft.     Tenderness: There is no abdominal tenderness. There is no right CVA tenderness, left CVA tenderness, guarding or rebound.  Skin:    General: Skin is warm and dry.  Neurological:     Mental Status: She is alert.       Assessment & Plan:    See Problem List for Assessment and Plan of chronic medical problems.

## 2021-02-27 ENCOUNTER — Encounter: Payer: Self-pay | Admitting: Internal Medicine

## 2021-02-27 ENCOUNTER — Ambulatory Visit (INDEPENDENT_AMBULATORY_CARE_PROVIDER_SITE_OTHER): Payer: PPO | Admitting: Internal Medicine

## 2021-02-27 ENCOUNTER — Other Ambulatory Visit: Payer: Self-pay

## 2021-02-27 VITALS — BP 102/72 | HR 71 | Temp 98.1°F | Ht 61.0 in | Wt 107.0 lb

## 2021-02-27 DIAGNOSIS — R300 Dysuria: Secondary | ICD-10-CM

## 2021-02-27 DIAGNOSIS — K5904 Chronic idiopathic constipation: Secondary | ICD-10-CM | POA: Diagnosis not present

## 2021-02-27 DIAGNOSIS — N3000 Acute cystitis without hematuria: Secondary | ICD-10-CM | POA: Diagnosis not present

## 2021-02-27 DIAGNOSIS — N390 Urinary tract infection, site not specified: Secondary | ICD-10-CM | POA: Insufficient documentation

## 2021-02-27 LAB — POC URINALSYSI DIPSTICK (AUTOMATED)
Bilirubin, UA: NEGATIVE
Blood, UA: NEGATIVE
Glucose, UA: NEGATIVE
Ketones, UA: NEGATIVE
Leukocytes, UA: NEGATIVE
Nitrite, UA: POSITIVE
Protein, UA: NEGATIVE
Spec Grav, UA: >=1.03 — AB (ref 1.010–1.025)
Urobilinogen, UA: 0.2 E.U./dL
pH, UA: 6 (ref 5.0–8.0)

## 2021-02-27 MED ORDER — PREMARIN 0.625 MG/GM VA CREA: 1.0000 | TOPICAL_CREAM | VAGINAL | 12 refills | Status: DC

## 2021-02-27 MED ORDER — CIPROFLOXACIN HCL 250 MG PO TABS: 250.0000 mg | ORAL_TABLET | Freq: Two times a day (BID) | ORAL | 0 refills | Status: AC

## 2021-02-27 NOTE — Patient Instructions (Signed)
Take the antibiotic as prescribed.  Start the premarin cream 2 weekly.  Take tylenol if needed.     Increase your water intake.   Call if no improvement     Urinary Tract Infection, Adult A urinary tract infection (UTI) is an infection of any part of the urinary tract, which includes the kidneys, ureters, bladder, and urethra. These organs make, store, and get rid of urine in the body. UTI can be a bladder infection (cystitis) or kidney infection (pyelonephritis). What are the causes? This infection may be caused by fungi, viruses, or bacteria. Bacteria are the most common cause of UTIs. This condition can also be caused by repeated incomplete emptying of the bladder during urination. What increases the risk? This condition is more likely to develop if: You ignore your need to urinate or hold urine for long periods of time. You do not empty your bladder completely during urination. You wipe back to front after urinating or having a bowel movement, if you are female. You are uncircumcised, if you are female. You are constipated. You have a urinary catheter that stays in place (indwelling). You have a weak defense (immune) system. You have a medical condition that affects your bowels, kidneys, or bladder. You have diabetes. You take antibiotic medicines frequently or for long periods of time, and the antibiotics no longer work well against certain types of infections (antibiotic resistance). You take medicines that irritate your urinary tract. You are exposed to chemicals that irritate your urinary tract. You are female.  What are the signs or symptoms? Symptoms of this condition include: Fever. Frequent urination or passing small amounts of urine frequently. Needing to urinate urgently. Pain or burning with urination. Urine that smells bad or unusual. Cloudy urine. Pain in the lower abdomen or back. Trouble urinating. Blood in the urine. Vomiting or being less hungry than  normal. Diarrhea or abdominal pain. Vaginal discharge, if you are female.  How is this diagnosed? This condition is diagnosed with a medical history and physical exam. You will also need to provide a urine sample to test your urine. Other tests may be done, including: Blood tests. Sexually transmitted disease (STD) testing.  If you have had more than one UTI, a cystoscopy or imaging studies may be done to determine the cause of the infections. How is this treated? Treatment for this condition often includes a combination of two or more of the following: Antibiotic medicine. Other medicines to treat less common causes of UTI. Over-the-counter medicines to treat pain. Drinking enough water to stay hydrated.  Follow these instructions at home: Take over-the-counter and prescription medicines only as told by your health care provider. If you were prescribed an antibiotic, take it as told by your health care provider. Do not stop taking the antibiotic even if you start to feel better. Avoid alcohol, caffeine, tea, and carbonated beverages. They can irritate your bladder. Drink enough fluid to keep your urine clear or pale yellow. Keep all follow-up visits as told by your health care provider. This is important. Make sure to: Empty your bladder often and completely. Do not hold urine for long periods of time. Empty your bladder before and after sex. Wipe from front to back after a bowel movement if you are female. Use each tissue one time when you wipe. Contact a health care provider if: You have back pain. You have a fever. You feel nauseous or vomit. Your symptoms do not get better after 3 days. Your symptoms go away  and then return. Get help right away if: You have severe back pain or lower abdominal pain. You are vomiting and cannot keep down any medicines or water. This information is not intended to replace advice given to you by your health care provider. Make sure you discuss  any questions you have with your health care provider. Document Released: 11/07/2004 Document Revised: 07/12/2015 Document Reviewed: 12/19/2014 Elsevier Interactive Patient Education  [employer].

## 2021-02-27 NOTE — Assessment & Plan Note (Signed)
Chronic Probiotics not helpful Fiber supplement helped too much - can try 1/2 dose Can try otc stool softener - adjust as needed Eats a good amount of fiber, drinks plenty of water and exercises

## 2021-02-27 NOTE — Assessment & Plan Note (Signed)
Has had freq uti's ? Started after stopping oral estrogen Start premarin cream twice weekly Consider pelvic PT

## 2021-02-27 NOTE — Assessment & Plan Note (Signed)
Acute Urine dip consistent with UTI Will send urine for culture Take the antibiotic as prescribed.  cipro 250 mg bid x 3 days Take tylenol if needed.   Increase your water intake.  Call if no improvement

## 2021-02-28 ENCOUNTER — Encounter: Payer: Self-pay | Admitting: Internal Medicine

## 2021-02-28 DIAGNOSIS — Z1231 Encounter for screening mammogram for malignant neoplasm of breast: Secondary | ICD-10-CM | POA: Diagnosis not present

## 2021-03-01 MED ORDER — ESTRADIOL 0.1 MG/GM VA CREA: 1.0000 | TOPICAL_CREAM | VAGINAL | 12 refills | Status: DC

## 2021-03-02 LAB — URINE CULTURE

## 2021-03-07 DIAGNOSIS — M542 Cervicalgia: Secondary | ICD-10-CM | POA: Diagnosis not present

## 2021-03-26 DIAGNOSIS — M542 Cervicalgia: Secondary | ICD-10-CM | POA: Diagnosis not present

## 2021-04-16 DIAGNOSIS — M542 Cervicalgia: Secondary | ICD-10-CM | POA: Diagnosis not present

## 2021-05-07 DIAGNOSIS — M542 Cervicalgia: Secondary | ICD-10-CM | POA: Diagnosis not present

## 2021-05-08 DIAGNOSIS — L400 Psoriasis vulgaris: Secondary | ICD-10-CM | POA: Diagnosis not present

## 2021-05-08 DIAGNOSIS — L821 Other seborrheic keratosis: Secondary | ICD-10-CM | POA: Diagnosis not present

## 2021-05-08 DIAGNOSIS — L570 Actinic keratosis: Secondary | ICD-10-CM | POA: Diagnosis not present

## 2021-05-28 DIAGNOSIS — M542 Cervicalgia: Secondary | ICD-10-CM | POA: Diagnosis not present

## 2021-06-12 ENCOUNTER — Encounter: Payer: Self-pay | Admitting: Nurse Practitioner

## 2021-06-12 ENCOUNTER — Ambulatory Visit (INDEPENDENT_AMBULATORY_CARE_PROVIDER_SITE_OTHER): Payer: PPO | Admitting: Nurse Practitioner

## 2021-06-12 VITALS — BP 120/76 | Ht 60.0 in | Wt 105.0 lb

## 2021-06-12 DIAGNOSIS — Z9189 Other specified personal risk factors, not elsewhere classified: Secondary | ICD-10-CM

## 2021-06-12 DIAGNOSIS — M85851 Other specified disorders of bone density and structure, right thigh: Secondary | ICD-10-CM

## 2021-06-12 DIAGNOSIS — N951 Menopausal and female climacteric states: Secondary | ICD-10-CM | POA: Diagnosis not present

## 2021-06-12 DIAGNOSIS — M85852 Other specified disorders of bone density and structure, left thigh: Secondary | ICD-10-CM | POA: Diagnosis not present

## 2021-06-12 DIAGNOSIS — Z01419 Encounter for gynecological examination (general) (routine) without abnormal findings: Secondary | ICD-10-CM

## 2021-06-12 NOTE — Progress Notes (Signed)
? Renee Santos October 05, 1945 [id number] ? ? ?History:  76 y.o. G2P2 presents for breast and pelvic exam. Postmenopausal - stopped ERT about a year ago, mild hot flashes and insomnia. S/P TAH BSO at age 48 for endometrioma with LSO for cyst. Normal pap and mammogram history. Hypothyroidism managed by PCP. Walks 5-6 miles a day.  ? ?Gynecologic History ?No LMP recorded. Patient has had a hysterectomy. ?  ?Contraception: status post hysterectomy ?Sexually active: No ? ?Health Maintenance ?Last Pap: 04/15/2017. Results were: Normal ?Last mammogram: 02/11/2021. Results were: Normal ?Last colonoscopy: 2015. Results were: norma,l, 10-year recall ?Last Dexa: 02/23/2020. Results were: T-score -1.4 ? ?Past medical history, past surgical history, family history and social history were all reviewed and documented in the EPIC chart. Married. 2 children, 4 grandchildren - 58 yo twin boys, 55 and 12 yo girls, all siblings.  ? ?ROS:  A ROS was performed and pertinent positives and negatives are included. ? ?Exam: ? ?Vitals:  ? 06/12/21 0802  ?BP: 120/76  ?Weight: 105 lb (47.6 kg)  ?Height: 5' (1.524 m)  ? ? ?Body mass index is 20.51 kg/m?. ? ?General appearance:  Normal ?Thyroid:  Symmetrical, normal in size, without palpable masses or nodularity. ?Respiratory ? Auscultation:  Clear without wheezing or rhonchi ?Cardiovascular ? Auscultation:  Regular rate, without rubs, murmurs or gallops ? Edema/varicosities:  Not grossly evident ?Abdominal ? Soft,nontender, without masses, guarding or rebound. ? Liver/spleen:  No organomegaly noted ? Hernia:  None appreciated ? Skin ? Inspection:  Grossly normal ?Breasts: Examined lying and sitting.  ? Right: Without masses, retractions, nipple discharge or axillary adenopathy. ? ? Left: Without masses, retractions, nipple discharge or axillary adenopathy. ?Gentitourinary  ? Inguinal/mons:  Normal without inguinal adenopathy ? External genitalia:  Normal appearing vulva with no masses, tenderness, or  lesions ? BUS/Urethra/Skene's glands:  Normal ? Vagina:  Normal appearing with normal color and discharge, no lesions. Atrophic changes ? Cervix:  Absent ? Uterus:  Absent ? Adnexa/parametria:   ?  Rt: Normal in size, without masses or tenderness. ?  Lt: Normal in size, without masses or tenderness. ? Anus and perineum: Normal ? Digital rectal exam: Normal sphincter tone without palpated masses or tenderness ? ?Patient informed chaperone available to be present for breast and pelvic exam. Patient has requested no chaperone to be present. Patient has been advised what will be completed during breast and pelvic exam.  ? ?Assessment/Plan:  76 y.o. G2P2 for breast and pelvic exam.  ? ?Well female exam with routine gynecological exam - Education provided on SBEs, importance of preventative screenings, current guidelines, high calcium diet, regular exercise, and multivitamin daily. Labs with PCP.  ? ?Postmenopausal - on ERT. S/P TAH BSO at age 71 for endometrioma with LSO for cyst. ? ?Osteopenia of necks of both femurs - January 2022 T-score 1.4. She walks 5-6 miles per day at the park with friends and with walking her dog. Takes  ? ?Menopausal symptoms - stopped ERT about a year ago. Hot flashes returned months later, went away and returned again. They are mild. She also has insomnia. Provided her list of natural supplements and recommend Magnesium at bedtime. Discussed good sleep hygiene routine. ? ?Screening for cervical cancer - Normal Pap history.  No longer screening per guidelines.  ? ?Screening for breast cancer - Normal mammogram history.  Continue annual screenings.  Normal breast exam today. ? ?Screening for colon cancer - 2015 colonoscopy. Will repeat at GI's recommended interval.  ? ?Return in 2  years for breast and pelvic exam.  ? ? ?Tamela Gammon DNP, 8:34 AM 06/12/2021 ? ?

## 2021-06-26 DIAGNOSIS — M542 Cervicalgia: Secondary | ICD-10-CM | POA: Diagnosis not present

## 2021-07-08 DIAGNOSIS — L03818 Cellulitis of other sites: Secondary | ICD-10-CM | POA: Diagnosis not present

## 2021-07-11 ENCOUNTER — Encounter: Payer: Self-pay | Admitting: Internal Medicine

## 2021-07-11 ENCOUNTER — Ambulatory Visit (INDEPENDENT_AMBULATORY_CARE_PROVIDER_SITE_OTHER): Payer: PPO | Admitting: Internal Medicine

## 2021-07-11 VITALS — BP 118/76 | HR 70 | Temp 98.5°F | Ht 60.0 in | Wt 105.0 lb

## 2021-07-11 DIAGNOSIS — H9202 Otalgia, left ear: Secondary | ICD-10-CM | POA: Diagnosis not present

## 2021-07-11 DIAGNOSIS — J353 Hypertrophy of tonsils with hypertrophy of adenoids: Secondary | ICD-10-CM | POA: Diagnosis not present

## 2021-07-11 DIAGNOSIS — L03811 Cellulitis of head [any part, except face]: Secondary | ICD-10-CM | POA: Insufficient documentation

## 2021-07-11 NOTE — Assessment & Plan Note (Signed)
Acute Started several days ago - went to urgent care - dx with cellulitis behind left ear On doxycycline - has taken 3.5 days so far - slightly better She feels pain inside and does not feel good - ? Just cellulitis vs possible mastoiditis Continue and finish doxycycline x 10 days Will get CT scan of neck - r/o mastoiditis infection in soft tissues of neck

## 2021-07-11 NOTE — Patient Instructions (Addendum)
     Continue the doxycycline for now   A Ct scan was ordered - someone will call you to schedule this.     Return if symptoms worsen or fail to improve.

## 2021-07-11 NOTE — Progress Notes (Signed)
Subjective:    Patient ID: Renee Santos, female    DOB: 1945-09-20, 76 y.o.   MRN: [id number]      HPI [facility] is here for  Chief Complaint  Patient presents with   Cellulitis    Cellulitis behind left ear     She started having discomfort behind her left ear and her daughter looked at it Sunday - it was red.  Sunday went to urgent care and was diagnosed with cellulitis.  She denies any injury or possible cause.  She was placed on doxycyline and has taken it for 3 1/2 days.  It is a little better but she still has pain.  She is taking tylenol 2 tab twice a day.    The area is still red, painful.  She also has a sore throat, soreness at her left TMJ and feels like it hurst on the inside.  She still does not feel well.  She denies fever.    Doing PT for neck pain.  Has chronic neck arthritis.      Medications and allergies reviewed with patient and updated if appropriate.  Current Outpatient Medications on File Prior to Visit  Medication Sig Dispense Refill   BIOTIN FORTE PO Take 1 tablet by mouth.     Calcium Carbonate-Vit D-Min (CALTRATE PLUS PO) Take by mouth daily.       clobetasol cream (TEMOVATE) 1.60 % Apply 1 application topically 2 (two) times daily. (Patient taking differently: Apply 1 application. topically 2 (two) times daily as needed.) 45 g 1   diphenhydrAMINE (BENADRYL) 25 mg capsule Take 25-50 mg by mouth at bedtime. Taking as needed, typically 3 times as week     doxycycline (VIBRA-TABS) 100 MG tablet SMARTSIG:1 Tablet(s) By Mouth Every 12 Hours     hydrochlorothiazide (HYDRODIURIL) 25 MG tablet Take 1/2 tablet by mouth daily 45 tablet 2   levothyroxine (SYNTHROID) 75 MCG tablet Take 1 tablet by mouth 6 days a week. (new directions) 77 tablet 2   LORazepam (ATIVAN) 0.5 MG tablet Take 1 tablet (0.5 mg total) by mouth 2 (two) times daily as needed. for anxiety 15 tablet 0   metoprolol succinate (TOPROL-XL) 50 MG 24 hr tablet Take 1/2 tablet by mouth every day 45  tablet 2   multivitamin-lutein (OCUVITE-LUTEIN) CAPS capsule Take 1 capsule by mouth daily.     valACYclovir (VALTREX) 1000 MG tablet Take 1,000 mg by mouth daily.     No current facility-administered medications on file prior to visit.    Review of Systems  Constitutional:  Negative for chills and fever.  HENT:  Positive for sore throat. Negative for congestion, ear pain (no inner left ear pain), sinus pressure and sinus pain.        Left TMJ pain.  No pain with eating  Musculoskeletal:  Positive for neck pain.      Objective:   Vitals:   07/11/21 1427  BP: 118/76  Pulse: 70  Temp: 98.5 F (36.9 C)  SpO2: 97%   BP Readings from Last 3 Encounters:  07/11/21 118/76  06/12/21 120/76  02/27/21 102/72   Wt Readings from Last 3 Encounters:  07/11/21 105 lb (47.6 kg)  06/12/21 105 lb (47.6 kg)  02/27/21 107 lb (48.5 kg)   Body mass index is 20.51 kg/m.    Physical Exam Constitutional:      General: She is not in acute distress.    Appearance: Normal appearance.  HENT:     Head:  Normocephalic.     Left Ear: Tympanic membrane, ear canal and external ear normal.     Mouth/Throat:     Mouth: Mucous membranes are moist.     Pharynx: No oropharyngeal exudate or posterior oropharyngeal erythema.  Musculoskeletal:     Cervical back: Tenderness (tenderness and mild swelling left side of neck - possible lymphadenopathy) present.     Comments: Good ROM of neck  Lymphadenopathy:     Cervical: Cervical adenopathy (left side near SCM) present.  Skin:    General: Skin is warm and dry.     Findings: Erythema (patch of erythema and slight skin thickening behind left ear) present.  Neurological:     Mental Status: She is alert.           Assessment & Plan:    See Problem List for Assessment and Plan of chronic medical problems.

## 2021-07-12 ENCOUNTER — Ambulatory Visit (HOSPITAL_BASED_OUTPATIENT_CLINIC_OR_DEPARTMENT_OTHER)
Admission: RE | Admit: 2021-07-12 | Discharge: 2021-07-12 | Disposition: A | Discharge status: Home / Self Care | Payer: PPO | Source: Ambulatory Visit | Attending: Internal Medicine | Admitting: Internal Medicine

## 2021-07-12 DIAGNOSIS — H9202 Otalgia, left ear: Secondary | ICD-10-CM | POA: Insufficient documentation

## 2021-07-12 DIAGNOSIS — L03811 Cellulitis of head [any part, except face]: Secondary | ICD-10-CM | POA: Diagnosis not present

## 2021-07-12 DIAGNOSIS — R221 Localized swelling, mass and lump, neck: Secondary | ICD-10-CM | POA: Diagnosis not present

## 2021-07-12 MED ORDER — IOHEXOL 300 MG/ML  SOLN
80.0000 mL | Freq: Once | INTRAMUSCULAR | Status: AC | PRN
  Administered 2021-07-12: 80 mL via INTRAVENOUS

## 2021-07-13 ENCOUNTER — Encounter: Payer: Self-pay | Admitting: Internal Medicine

## 2021-07-13 DIAGNOSIS — M542 Cervicalgia: Secondary | ICD-10-CM

## 2021-07-14 NOTE — Addendum Note (Signed)
Addended by: Binnie Rail on: 07/14/2021 08:43 AM   Modules accepted: Orders

## 2021-07-20 ENCOUNTER — Encounter: Payer: Self-pay | Admitting: Internal Medicine

## 2021-07-20 DIAGNOSIS — E039 Hypothyroidism, unspecified: Secondary | ICD-10-CM

## 2021-07-30 DIAGNOSIS — M542 Cervicalgia: Secondary | ICD-10-CM | POA: Diagnosis not present

## 2021-08-10 DIAGNOSIS — J353 Hypertrophy of tonsils with hypertrophy of adenoids: Secondary | ICD-10-CM | POA: Diagnosis not present

## 2021-08-23 DIAGNOSIS — M542 Cervicalgia: Secondary | ICD-10-CM | POA: Diagnosis not present

## 2021-08-23 NOTE — Addendum Note (Signed)
Addended by: Binnie Rail on: 08/23/2021 03:58 AM   Modules accepted: Orders

## 2021-09-17 DIAGNOSIS — M542 Cervicalgia: Secondary | ICD-10-CM | POA: Diagnosis not present

## 2021-09-20 DIAGNOSIS — M542 Cervicalgia: Secondary | ICD-10-CM | POA: Diagnosis not present

## 2021-09-20 DIAGNOSIS — M47812 Spondylosis without myelopathy or radiculopathy, cervical region: Secondary | ICD-10-CM | POA: Diagnosis not present

## 2021-09-26 ENCOUNTER — Other Ambulatory Visit: Payer: Self-pay

## 2021-09-26 MED ORDER — LEVOTHYROXINE SODIUM 75 MCG PO TABS: ORAL_TABLET | ORAL | 2 refills | Status: DC

## 2021-10-05 ENCOUNTER — Ambulatory Visit (INDEPENDENT_AMBULATORY_CARE_PROVIDER_SITE_OTHER): Payer: PPO

## 2021-10-05 VITALS — Wt 104.0 lb

## 2021-10-05 DIAGNOSIS — Z0000 Encounter for general adult medical examination without abnormal findings: Secondary | ICD-10-CM | POA: Diagnosis not present

## 2021-10-05 LAB — HM MAMMOGRAPHY

## 2021-10-05 NOTE — Patient Instructions (Signed)
Renee Santos , Thank you for taking time to come for your Medicare Wellness Visit. I appreciate your ongoing commitment to your health goals. Please review the following plan we discussed and let me know if I can assist you in the future.   Screening recommendations/referrals: Colonoscopy: 12/30/2013  due 12/2023 Mammogram: Patient to obtain mammogram and send to PCP  Bone Density: 02/23/2020 Recommended yearly ophthalmology/optometry visit for glaucoma screening and checkup Recommended yearly dental visit for hygiene and checkup  Vaccinations: Influenza vaccine: completed  Pneumococcal vaccine: completed  Tdap vaccine: 04/21/2017 Shingles vaccine: Will Consider    Covid-19:completed   Advanced directives: yes will provide copies   Conditions/risks identified: none  Next appointment: Follow up in one year for your annual wellness visit    Preventive Care 65 Years and Older, Female Preventive care refers to lifestyle choices and visits with your health care provider that can promote health and wellness. What does preventive care include? A yearly physical exam. This is also called an annual well check.  Dental exams once or twice a year. Routine eye exams. Ask your health care provider how often you should have your eyes checked. Personal lifestyle choices, including: Daily care of your teeth and gums. Regular physical activity. Eating a healthy diet. Avoiding tobacco and drug use. Limiting alcohol use. Practicing safe sex. Taking low-dose aspirin every day. Taking vitamin and mineral supplements as recommended by your health care provider. What happens during an annual well check? The services and screenings done by your health care provider during your annual well check will depend on your age, overall health, lifestyle risk factors, and family history of disease. Counseling  Your health care provider may ask you questions about your: Alcohol use. Tobacco use. Drug  use. Emotional well-being. Home and relationship well-being. Sexual activity. Eating habits. History of falls. Memory and ability to understand (cognition). Work and work Statistician. Reproductive health. Screening  You may have the following tests or measurements: Height, weight, and BMI. Blood pressure. Lipid and cholesterol levels. These may be checked every 5 years, or more frequently if you are over 31 years old. Skin check. Lung cancer screening. You may have this screening every year starting at age 12 if you have a 30-pack-year history of smoking and currently smoke or have quit within the past 15 years. Fecal occult blood test (FOBT) of the stool. You may have this test every year starting at age 62. Flexible sigmoidoscopy or colonoscopy. You may have a sigmoidoscopy every 5 years or a colonoscopy every 10 years starting at age 58. Hepatitis C blood test. Hepatitis B blood test. Sexually transmitted disease (STD) testing. Diabetes screening. This is done by checking your blood sugar (glucose) after you have not eaten for a while (fasting). You may have this done every 1-3 years. Bone density scan. This is done to screen for osteoporosis. You may have this done starting at age 29. Mammogram. This may be done every 1-2 years. Talk to your health care provider about how often you should have regular mammograms. Talk with your health care provider about your test results, treatment options, and if necessary, the need for more tests. Vaccines  Your health care provider may recommend certain vaccines, such as: Influenza vaccine. This is recommended every year. Tetanus, diphtheria, and acellular pertussis (Tdap, Td) vaccine. You may need a Td booster every 10 years. Zoster vaccine. You may need this after age 75. Pneumococcal 13-valent conjugate (PCV13) vaccine. One dose is recommended after age 69. Pneumococcal polysaccharide (PPSV23)  vaccine. One dose is recommended after age  58. Talk to your health care provider about which screenings and vaccines you need and how often you need them. This information is not intended to replace advice given to you by your health care provider. Make sure you discuss any questions you have with your health care provider. Document Released: 02/24/2015 Document Revised: 10/18/2015 Document Reviewed: 11/29/2014 Elsevier Interactive Patient Education  2017 [place] Prevention in the Home Falls can cause injuries. They can happen to people of all ages. There are many things you can do to make your home safe and to help prevent falls. What can I do on the outside of my home? Regularly fix the edges of walkways and driveways and fix any cracks. Remove anything that might make you trip as you walk through a door, such as a raised step or threshold. Trim any bushes or trees on the path to your home. Use bright outdoor lighting. Clear any walking paths of anything that might make someone trip, such as rocks or tools. Regularly check to see if handrails are loose or broken. Make sure that both sides of any steps have handrails. Any raised decks and porches should have guardrails on the edges. Have any leaves, snow, or ice cleared regularly. Use sand or salt on walking paths during winter. Clean up any spills in your garage right away. This includes oil or grease spills. What can I do in the bathroom? Use night lights. Install grab bars by the toilet and in the tub and shower. Do not use towel bars as grab bars. Use non-skid mats or decals in the tub or shower. If you need to sit down in the shower, use a plastic, non-slip stool. Keep the floor dry. Clean up any water that spills on the floor as soon as it happens. Remove soap buildup in the tub or shower regularly. Attach bath mats securely with double-sided non-slip rug tape. Do not have throw rugs and other things on the floor that can make you trip. What can I do in the  bedroom? Use night lights. Make sure that you have a light by your bed that is easy to reach. Do not use any sheets or blankets that are too big for your bed. They should not hang down onto the floor. Have a firm chair that has side arms. You can use this for support while you get dressed. Do not have throw rugs and other things on the floor that can make you trip. What can I do in the kitchen? Clean up any spills right away. Avoid walking on wet floors. Keep items that you use a lot in easy-to-reach places. If you need to reach something above you, use a strong step stool that has a grab bar. Keep electrical cords out of the way. Do not use floor polish or wax that makes floors slippery. If you must use wax, use non-skid floor wax. Do not have throw rugs and other things on the floor that can make you trip. What can I do with my stairs? Do not leave any items on the stairs. Make sure that there are handrails on both sides of the stairs and use them. Fix handrails that are broken or loose. Make sure that handrails are as long as the stairways. Check any carpeting to make sure that it is firmly attached to the stairs. Fix any carpet that is loose or worn. Avoid having throw rugs at the top or bottom  of the stairs. If you do have throw rugs, attach them to the floor with carpet tape. Make sure that you have a light switch at the top of the stairs and the bottom of the stairs. If you do not have them, ask someone to add them for you. What else can I do to help prevent falls? Wear shoes that: Do not have high heels. Have rubber bottoms. Are comfortable and fit you well. Are closed at the toe. Do not wear sandals. If you use a stepladder: Make sure that it is fully opened. Do not climb a closed stepladder. Make sure that both sides of the stepladder are locked into place. Ask someone to hold it for you, if possible. Clearly mark and make sure that you can see: Any grab bars or  handrails. First and last steps. Where the edge of each step is. Use tools that help you move around (mobility aids) if they are needed. These include: Canes. Walkers. Scooters. Crutches. Turn on the lights when you go into a dark area. Replace any light bulbs as soon as they burn out. Set up your furniture so you have a clear path. Avoid moving your furniture around. If any of your floors are uneven, fix them. If there are any pets around you, be aware of where they are. Review your medicines with your doctor. Some medicines can make you feel dizzy. This can increase your chance of falling. Ask your doctor what other things that you can do to help prevent falls. This information is not intended to replace advice given to you by your health care provider. Make sure you discuss any questions you have with your health care provider. Document Released: 11/24/2008 Document Revised: 07/06/2015 Document Reviewed: 03/04/2014 Elsevier Interactive Patient Education  2017 [employer].

## 2021-10-05 NOTE — Progress Notes (Signed)
Subjective:   Renee Santos is a 76 y.o. female who presents for Medicare Annual (Subsequent) preventive examination.   Virtual Visit via Telephone Note  I connected with  Tahja Liao Kenna on 10/05/21 at  9:15 AM EDT by telephone and verified that I am speaking with the correct person using two identifiers.  Location: Patient: home  Provider: Ashok Norris  Persons participating in the virtual visit: patient/Nurse Health Advisor   I discussed the limitations, risks, security and privacy concerns of performing an evaluation and management service by telephone and the availability of in person appointments. The patient expressed understanding and agreed to proceed.  Interactive audio and video telecommunications were attempted between this nurse and patient, however failed, due to patient having technical difficulties OR patient did not have access to video capability.  We continued and completed visit with audio only.  Some vital signs may be absent or patient reported.   Daphane Shepherd, LPN  Review of Systems     Cardiac Risk Factors include: advanced age (>68mn, >>87women)     Objective:    Today's Vitals   10/05/21 0917  Weight: 104 lb (47.2 kg)   Body mass index is 20.31 kg/m.     10/05/2021    9:26 AM 09/13/2020    2:52 PM 09/13/2020    1:35 PM 06/17/2019   10:07 AM 12/30/2017    8:32 AM 12/24/2016    8:23 AM 07/25/2015    9:51 AM  Advanced Directives  Does Patient Have a Medical Advance Directive? Yes Yes Yes Yes Yes Yes Yes  Type of AParamedicof [place] will Living will;Healthcare Power of Attorney Living will;Healthcare Power of [place] will [place] Island[place] will [place] will   Does patient want to make changes to medical advance directive?   No - Patient declined No - Patient declined     Copy of HSt. Areyana'sin Chart? No - copy  requested No - copy requested No - copy requested No - copy requested No - copy requested No - copy requested     Current Medications (verified) Outpatient Encounter Medications as of 10/05/2021  Medication Sig   BIOTIN FORTE PO Take 1 tablet by mouth.   Calcium Carbonate-Vit D-Min (CALTRATE PLUS PO) Take by mouth daily.     clobetasol cream (TEMOVATE) 07.06% Apply 1 application topically 2 (two) times daily. (Patient taking differently: Apply 1 application  topically 2 (two) times daily as needed.)   diphenhydrAMINE (BENADRYL) 25 mg capsule Take 25-50 mg by mouth at bedtime. Taking as needed, typically 3 times as week   doxycycline (VIBRA-TABS) 100 MG tablet SMARTSIG:1 Tablet(s) By Mouth Every 12 Hours   hydrochlorothiazide (HYDRODIURIL) 25 MG tablet Take 1/2 tablet by mouth daily   levothyroxine (SYNTHROID) 75 MCG tablet Take 1 tablet by mouth 6 days a week. (new directions)   LORazepam (ATIVAN) 0.5 MG tablet Take 1 tablet (0.5 mg total) by mouth 2 (two) times daily as needed. for anxiety   metoprolol succinate (TOPROL-XL) 50 MG 24 hr tablet Take 1/2 tablet by mouth every day   multivitamin-lutein (OCUVITE-LUTEIN) CAPS capsule Take 1 capsule by mouth daily.   valACYclovir (VALTREX) 1000 MG tablet Take 1,000 mg by mouth daily.   No facility-administered encounter medications on file as of 10/05/2021.    Allergies (verified) Patient has no known allergies.   History: Past Medical History:  Diagnosis Date  Cancer [facility]) 2011   Basal Cell of nose; Dr Martinique   Herpes zoster 2008   posterior thorax   Hypertension    Neurofibroma of back 2013    Dr Amy Martinique    Osteopenia 2016   T score -1.6 FRAX not calculated because of HRT   Psoriasis    Dr Amy Martinique   Thyroid nodule    Past Surgical History:  Procedure Laterality Date   ABDOMINAL HYSTERECTOMY      LSO  & appendectomy for endometrioma   @ age38 for ovarian cyst   APPENDECTOMY     cataract Bilateral Dec 2016;   not sure of  date; wears readers only    COLONOSCOPY  multiple   OOPHORECTOMY     LSO for cyst   TONSILLECTOMY     Family History  Problem Relation Age of Onset   Hypertension Father    Lung cancer Father        remote smoker, deceased 38   Hyperlipidemia Mother        CABG in late 61s   Hypothyroidism Mother    Macular degeneration Mother    Hypertension Mother    Coronary artery disease Mother        bypass surgery   Stroke Mother 66       Deceased   Coronary artery disease Maternal Grandmother    Asthma Maternal Uncle        2   Asthma Maternal Grandfather    Diabetes Neg Hx    Heart attack Neg Hx    Colon cancer Neg Hx    Social History   Socioeconomic History   Marital status: Married    Spouse name: Not on file   Number of children: 2   Years of education: Not on file   Highest education level: Not on file  Occupational History   Not on file  Tobacco Use   Smoking status: Former   Smokeless tobacco: Never   Tobacco comments:    college only socially  Vaping Use   Vaping Use: Never used  Substance and Sexual Activity   Alcohol use: Yes    Alcohol/week: 6.0 standard drinks of alcohol    Types: 6 Glasses of wine per week   Drug use: No   Sexual activity: Yes    Birth control/protection: Surgical    Comment: HYST-1st intercourse 76 yo-Fewer than 5 partners  Other Topics Concern   Not on file  Social History Narrative   Lives with husband x 45 years in a two story home.  Has 2 children.     Retired Education officer, museum in [phone].     Education: college.   Social Determinants of Health   Financial Resource Strain: Low Risk  (10/05/2021)   Overall Financial Resource Strain (CARDIA)    Difficulty of Paying Living Expenses: Not hard at all  Food Insecurity: No Food Insecurity (10/05/2021)   Hunger Vital Sign    Worried About Running Out of Food in the Last Year: Never true    Ran Out of Food in the Last Year: Never true  Transportation Needs: No Transportation Needs  (10/05/2021)   PRAPARE - Hydrologist (Medical): No    Lack of Transportation (Non-Medical): No  Physical Activity: Sufficiently Active (10/05/2021)   Exercise Vital Sign    Days of Exercise per Week: 5 days    Minutes of Exercise per Session: 120 min  Stress: No Stress Concern Present (10/05/2021)  [employer] [place] Questionnaire    Feeling of Stress : Not at all  Social Connections: Socially Integrated (10/05/2021)   Social Connection and Isolation Panel [NHANES]    Frequency of Communication with Friends and Family: More than three times a week    Frequency of Social Gatherings with Friends and Family: More than three times a week    Attends Religious Services: More than 4 times per year    Active Member of [employer] or Organizations: Yes    Attends Music therapist: More than 4 times per year    Marital Status: Married    Tobacco Counseling Counseling given: Not Answered Tobacco comments: college only socially   Clinical Intake:  Pre-visit preparation completed: Yes  Pain : No/denies pain     Diabetes: No  How often do you need to have someone help you when you read instructions, pamphlets, or other written materials from your doctor or pharmacy?: 1 - Never What is the last grade level you completed in school?: college  Diabetic?no   Interpreter Needed?: No  Information entered by :: L.Wilson,LPN   Activities of Daily Living    10/05/2021    9:26 AM  In your present state of health, do you have any difficulty performing the following activities:  Hearing? 0  Vision? 0  Difficulty concentrating or making decisions? 0  Walking or climbing stairs? 0  Dressing or bathing? 0  Doing errands, shopping? 0  Preparing Food and eating ? N  Using the Toilet? N  In the past six months, have you accidently leaked urine? N  Do you have problems with loss of bowel control? N  Managing your  Medications? N  Managing your Finances? N  Housekeeping or managing your Housekeeping? N    Patient Care Team: Binnie Rail, MD as PCP - General (Internal Medicine) Martinique, Amy, MD as Consulting Physician (Dermatology) Fontaine, Belinda Block, MD (Inactive) as Consulting Physician (Gynecology) Szabat, Darnelle Maffucci, [facility] (Inactive) as Pharmacist (Pharmacist) Calvert Cantor, MD as Consulting Physician (Ophthalmology) Sheral Flow, LPN as Licensed Practical Nurse (Internal Medicine)  Indicate any recent Medical Services you may have received from other than [facility] providers in the past year (date may be approximate).     Assessment:   This is a routine wellness examination for [facility].  Hearing/Vision screen Vision Screening - Comments:: Annual eye exams wear glasses   Dietary issues and exercise activities discussed: Current Exercise Habits: Home exercise routine, Time (Minutes): > 60, Frequency (Times/Week): 5, Weekly Exercise (Minutes/Week): 0, Intensity: Mild   Goals Addressed               This Visit's Progress     <enter goal here> (pt-stated)   On track     Wants to continue to be healthy  Including walking; trips; add light weights;   Viniyoga; [email or web address] for stress reduction         Depression Screen    10/05/2021    9:23 AM 10/10/2020   11:33 AM 09/13/2020    2:41 PM 06/17/2019   10:08 AM 02/02/2019    1:55 PM 12/30/2017    8:32 AM 12/24/2016    8:23 AM  PHQ 2/9 Scores  PHQ - 2 Score 0 0 0 0 0 0 0  PHQ- 9 Score  0    1 2    Fall Risk    10/05/2021    9:19 AM 09/13/2020    2:43 PM 06/17/2019  10:07 AM 02/02/2019    1:55 PM 12/30/2017    8:32 AM  Fall Risk   Falls in the past year? 0 0 0 0 0  Number falls in past yr: 0 0 0 0   Injury with Fall? 0 0 0    Risk for fall due to :  No Fall Risks No Fall Risks    Follow up Falls evaluation completed;Education provided Falls evaluation completed Falls evaluation completed;Education provided      FALL RISK  PREVENTION PERTAINING TO THE HOME:  Any stairs in or around the home? Yes  If so, are there any without handrails? No  Home free of loose throw rugs in walkways, pet beds, electrical cords, etc? Yes  Adequate lighting in your home to reduce risk of falls? Yes   ASSISTIVE DEVICES UTILIZED TO PREVENT FALLS:  Life alert? Yes  Use of a cane, walker or w/c? No  Grab bars in the bathroom? No  Shower chair or bench in shower? No  Elevated toilet seat or a handicapped toilet? No       12/24/2016    8:18 AM  MMSE - Mini Mental State Exam  Orientation to time 5  Orientation to Place 5  Registration 3  Attention/ Calculation 3  Recall 2  Language- name 2 objects 2  Language- repeat 1  Language- follow 3 step command 3  Language- read & follow direction 1  Write a sentence 1  Copy design 1  Total score 27        10/05/2021    9:27 AM  6CIT Screen  What Year? 0 points  What month? 0 points  What time? 0 points  Count back from 20 0 points  Months in reverse 0 points  Repeat phrase 0 points  Total Score 0 points    Immunizations Immunization History  Administered Date(s) Administered   Fluad Quad(high Dose 65+) 10/07/2018, 11/20/2020   H1N1 03/01/2008   Hepatitis A 07/05/1997, 04/19/2004   Hepatitis B 05/30/2008, 07/05/2008, 03/27/2011   Influenza Split 12/04/2010, 11/05/2011   Influenza, High Dose Seasonal PF 12/27/2015, 10/08/2016, 11/03/2019   Influenza,inj,Quad PF,6+ Mos 12/03/2012, 12/15/2013, 12/21/2014, 12/01/2017   Influenza-Unspecified 11/11/2017   Meningococcal Conjugate 07/05/2008   PFIZER(Purple Top)SARS-COV-2 Vaccination 03/08/2019, 03/29/2019, 11/19/2019, 05/19/2020   Pfizer Covid-19 Vaccine Bivalent Booster 78yr & up 01/05/2021   Pneumococcal Conjugate-13 06/15/2014   Pneumococcal Polysaccharide-23 04/13/2013   Tdap 04/21/2017   Tetanus 02/21/2014   Typhoid Inactivated 04/19/2004, 04/21/2017   Yellow Fever 05/30/2008   Zoster, Live 02/18/2014     TDAP status: Up to date  Flu Vaccine status: Up to date  Pneumococcal vaccine status: Up to date  Covid-19 vaccine status: Completed vaccines  Qualifies for Shingles Vaccine? Yes   Zostavax completed No   Shingrix Completed?: No.    Education has been provided regarding the importance of this vaccine. Patient has been advised to call insurance company to determine out of pocket expense if they have not yet received this vaccine. Advised may also receive vaccine at local pharmacy or Health Dept. Verbalized acceptance and understanding.  Screening Tests Health Maintenance  Topic Date Due   Zoster Vaccines- Shingrix (1 of 2) Never done   MAMMOGRAM  02/11/2021   COVID-19 Vaccine (6 - Pfizer series) 05/05/2021   INFLUENZA VACCINE  09/11/2021   DEXA SCAN  02/23/2023   COLONOSCOPY (Pts 45-453yrInsurance coverage will need to be confirmed)  12/31/2023   TETANUS/TDAP  04/22/2027   Pneumonia Vaccine 65+ Years  old  Completed   Hepatitis C Screening  Completed   HPV VACCINES  Aged Out    Health Maintenance  Health Maintenance Due  Topic Date Due   Zoster Vaccines- Shingrix (1 of 2) Never done   MAMMOGRAM  02/11/2021   COVID-19 Vaccine (6 - Pfizer series) 05/05/2021   INFLUENZA VACCINE  09/11/2021    Colorectal cancer screening: Type of screening: Colonoscopy. Completed 12/30/2013. Repeat every 10 years  Mammogram status: Completed patient to have imagining  to PCP  . Repeat every year  Bone Density status: Completed 02/12/2020. Results reflect: Bone density results: OSTEOPENIA. Repeat every 5 years.  Lung Cancer Screening: (Low Dose CT Chest recommended if Age 84-80 years, 30 pack-year currently smoking OR have quit w/in 15years.) does not qualify.   Lung Cancer Screening Referral: n/a  Additional Screening:  Hepatitis C Screening: does not qualify; Completed 12/12/2015  Vision Screening: Recommended annual ophthalmology exams for early detection of glaucoma and other  disorders of the eye. Is the patient up to date with their annual eye exam?  Yes  Who is the provider or what is the name of the office in which the patient attends annual eye exams? Dr.Digby  If pt is not established with a provider, would they like to be referred to a provider to establish care? No .   Dental Screening: Recommended annual dental exams for proper oral hygiene  Community Resource Referral / Chronic Care Management: CRR required this visit?  No   CCM required this visit?  No      Plan:     I have personally reviewed and noted the following in the patient's chart:   Medical and social history Use of alcohol, tobacco or illicit drugs  Current medications and supplements including opioid prescriptions. Patient is not currently taking opioid prescriptions. Functional ability and status Nutritional status Physical activity Advanced directives List of other physicians Hospitalizations, surgeries, and ER visits in previous 12 months Vitals Screenings to include cognitive, depression, and falls Referrals and appointments  In addition, I have reviewed and discussed with patient certain preventive protocols, quality metrics, and best practice recommendations. A written personalized care plan for preventive services as well as general preventive health recommendations were provided to patient.     Daphane Shepherd, LPN   05/19/6806   Nurse Notes: Patient to have imaging sent to PCP for Mammogram

## 2021-10-09 DIAGNOSIS — M542 Cervicalgia: Secondary | ICD-10-CM | POA: Diagnosis not present

## 2021-10-11 ENCOUNTER — Encounter: Payer: Self-pay | Admitting: Internal Medicine

## 2021-10-11 DIAGNOSIS — H6121 Impacted cerumen, right ear: Secondary | ICD-10-CM | POA: Diagnosis not present

## 2021-10-11 DIAGNOSIS — H9201 Otalgia, right ear: Secondary | ICD-10-CM | POA: Diagnosis not present

## 2021-10-11 NOTE — Progress Notes (Signed)
Outside notes received. Information abstracted. Notes sent to scan.  

## 2021-10-28 ENCOUNTER — Encounter: Payer: Self-pay | Admitting: Internal Medicine

## 2021-11-01 DIAGNOSIS — M542 Cervicalgia: Secondary | ICD-10-CM | POA: Diagnosis not present

## 2021-11-23 DIAGNOSIS — M542 Cervicalgia: Secondary | ICD-10-CM | POA: Diagnosis not present

## 2021-12-13 DIAGNOSIS — M542 Cervicalgia: Secondary | ICD-10-CM | POA: Diagnosis not present

## 2021-12-27 DIAGNOSIS — M542 Cervicalgia: Secondary | ICD-10-CM | POA: Diagnosis not present

## 2022-01-14 DIAGNOSIS — L400 Psoriasis vulgaris: Secondary | ICD-10-CM | POA: Diagnosis not present

## 2022-01-14 DIAGNOSIS — L821 Other seborrheic keratosis: Secondary | ICD-10-CM | POA: Diagnosis not present

## 2022-01-14 DIAGNOSIS — D485 Neoplasm of uncertain behavior of skin: Secondary | ICD-10-CM | POA: Diagnosis not present

## 2022-01-14 DIAGNOSIS — L570 Actinic keratosis: Secondary | ICD-10-CM | POA: Diagnosis not present

## 2022-01-14 DIAGNOSIS — D2271 Melanocytic nevi of right lower limb, including hip: Secondary | ICD-10-CM | POA: Diagnosis not present

## 2022-01-15 ENCOUNTER — Other Ambulatory Visit: Payer: Self-pay | Admitting: Internal Medicine

## 2022-01-15 DIAGNOSIS — I10 Essential (primary) hypertension: Secondary | ICD-10-CM

## 2022-01-16 DIAGNOSIS — H02834 Dermatochalasis of left upper eyelid: Secondary | ICD-10-CM | POA: Diagnosis not present

## 2022-01-16 DIAGNOSIS — H02831 Dermatochalasis of right upper eyelid: Secondary | ICD-10-CM | POA: Diagnosis not present

## 2022-01-16 DIAGNOSIS — Z961 Presence of intraocular lens: Secondary | ICD-10-CM | POA: Diagnosis not present

## 2022-01-16 DIAGNOSIS — H26491 Other secondary cataract, right eye: Secondary | ICD-10-CM | POA: Diagnosis not present

## 2022-01-17 DIAGNOSIS — M542 Cervicalgia: Secondary | ICD-10-CM | POA: Diagnosis not present

## 2022-01-31 DIAGNOSIS — M542 Cervicalgia: Secondary | ICD-10-CM | POA: Diagnosis not present

## 2022-02-18 DIAGNOSIS — M542 Cervicalgia: Secondary | ICD-10-CM | POA: Diagnosis not present

## 2022-03-04 DIAGNOSIS — M542 Cervicalgia: Secondary | ICD-10-CM | POA: Diagnosis not present

## 2022-03-08 DIAGNOSIS — J040 Acute laryngitis: Secondary | ICD-10-CM | POA: Diagnosis not present

## 2022-03-13 DIAGNOSIS — Z1231 Encounter for screening mammogram for malignant neoplasm of breast: Secondary | ICD-10-CM | POA: Diagnosis not present

## 2022-03-13 LAB — HM MAMMOGRAPHY

## 2022-03-19 DIAGNOSIS — M542 Cervicalgia: Secondary | ICD-10-CM | POA: Diagnosis not present

## 2022-04-02 DIAGNOSIS — M542 Cervicalgia: Secondary | ICD-10-CM | POA: Diagnosis not present

## 2022-04-17 ENCOUNTER — Encounter: Payer: Self-pay | Admitting: Internal Medicine

## 2022-04-17 NOTE — Progress Notes (Signed)
Outside notes received. Information abstracted. Notes sent to scan.  

## 2022-04-18 DIAGNOSIS — M542 Cervicalgia: Secondary | ICD-10-CM | POA: Diagnosis not present

## 2022-05-02 DIAGNOSIS — M542 Cervicalgia: Secondary | ICD-10-CM | POA: Diagnosis not present

## 2022-06-04 ENCOUNTER — Telehealth: Payer: Self-pay

## 2022-06-04 NOTE — Telephone Encounter (Signed)
Paperwork retrieved from basket up front today.  Turn around for paperwork is 7-10 days.

## 2022-06-04 NOTE — Telephone Encounter (Signed)
Spoke with patient and she has been informed of turn around time.  Will call once completed and faxed.

## 2022-06-04 NOTE — Telephone Encounter (Signed)
Pt has called checking to see if paperwork has been received from [facility] in regards to their medical records. Please call pt with update on if paperwork was filled out and sent back to [facility] for her and her husband Renee Santos, 11/17/1944.  Please call pt at [phone].

## 2022-06-06 DIAGNOSIS — Z0289 Encounter for other administrative examinations: Secondary | ICD-10-CM

## 2022-06-06 NOTE — Telephone Encounter (Signed)
Paperwork faxed and patient informed

## 2022-07-10 DIAGNOSIS — M542 Cervicalgia: Secondary | ICD-10-CM | POA: Diagnosis not present

## 2022-07-31 DIAGNOSIS — M542 Cervicalgia: Secondary | ICD-10-CM | POA: Diagnosis not present

## 2022-08-06 ENCOUNTER — Encounter: Payer: Self-pay | Admitting: Internal Medicine

## 2022-08-06 NOTE — Progress Notes (Unsigned)
Subjective:    Patient ID: Renee Santos, female    DOB: December 02, 1945, 77 y.o.   MRN: [id number]      HPI [name] is here for a Physical exam and her chronic medical problems.    Takes tylenol as needed - no nsaids Overall doing well.  Some increased stress because they are moving the end of the summer.  Medications and allergies reviewed with patient and updated if appropriate.  Current Outpatient Medications on File Prior to Visit  Medication Sig Dispense Refill   BIOTIN FORTE PO Take 1 tablet by mouth.     Calcium Carbonate-Vit D-Min (CALTRATE PLUS PO) Take by mouth daily.       clobetasol cream (TEMOVATE) 0.05 % Apply 1 application topically 2 (two) times daily. (Patient taking differently: Apply 1 application  topically 2 (two) times daily as needed.) 45 g 1   diphenhydrAMINE (BENADRYL) 25 mg capsule Take 25-50 mg by mouth at bedtime. Taking as needed, typically 3 times as week     hydrochlorothiazide (HYDRODIURIL) 25 MG tablet Take 1/2 tablet by mouth daily 45 tablet 1   levothyroxine (SYNTHROID) 75 MCG tablet Take 1 tablet by mouth 6 days a week. (new directions) 77 tablet 2   LORazepam (ATIVAN) 0.5 MG tablet Take 1 tablet (0.5 mg total) by mouth 2 (two) times daily as needed. for anxiety 15 tablet 0   metoprolol succinate (TOPROL-XL) 50 MG 24 hr tablet Take 1/2 tablet by mouth every day 45 tablet 1   multivitamin-lutein (OCUVITE-LUTEIN) CAPS capsule Take 1 capsule by mouth daily.     valACYclovir (VALTREX) 1000 MG tablet Take 1,000 mg by mouth daily.     No current facility-administered medications on file prior to visit.    Review of Systems  Constitutional:  Negative for fever.  Eyes:  Negative for visual disturbance.  Respiratory:  Negative for cough, shortness of breath and wheezing.   Cardiovascular:  Negative for chest pain, palpitations and leg swelling.  Gastrointestinal:  Positive for constipation and diarrhea. Negative for abdominal pain and blood in stool.        No gerd  Genitourinary:  Negative for dysuria.  Musculoskeletal:  Positive for neck pain. Negative for arthralgias and back pain.  Skin:  Negative for rash.  Neurological:  Negative for light-headedness and headaches.  Psychiatric/Behavioral:  Negative for dysphoric mood. The patient is not nervous/anxious.        Objective:   Vitals:   08/07/22 1033  BP: 122/72  Pulse: 70  Temp: 98.5 F (36.9 C)  SpO2: 96%   Filed Weights   08/07/22 1033  Weight: 100 lb (45.4 kg)   Body mass index is 19.53 kg/m.  BP Readings from Last 3 Encounters:  08/07/22 122/72  07/11/21 118/76  06/12/21 120/76    Wt Readings from Last 3 Encounters:  08/07/22 100 lb (45.4 kg)  10/05/21 104 lb (47.2 kg)  07/11/21 105 lb (47.6 kg)       Physical Exam Constitutional: She appears well-developed and well-nourished. No distress.  HENT:  Head: Normocephalic and atraumatic.  Right Ear: External ear normal. Normal ear canal and TM Left Ear: External ear normal.  Normal ear canal and TM Mouth/Throat: Oropharynx is clear and moist.  Eyes: Conjunctivae normal.  Neck: Neck supple. No tracheal deviation present. No thyromegaly present.  No carotid bruit  Cardiovascular: Normal rate, regular rhythm and normal heart sounds.   No murmur heard.  No edema. Pulmonary/Chest: Effort normal and breath sounds  normal. No respiratory distress. She has no wheezes. She has no rales.  Breast: deferred   Abdominal: Soft. She exhibits no distension. There is no tenderness.  Lymphadenopathy: She has no cervical adenopathy.  Skin: Skin is warm and dry. She is not diaphoretic.  Psychiatric: She has a normal mood and affect. Her behavior is normal.     Lab Results  Component Value Date   WBC 6.6 09/13/2020   HGB 13.4 09/13/2020   HCT 39.5 09/13/2020   PLT 206 09/13/2020   GLUCOSE 103 (H) 09/13/2020   CHOL 203 (H) 02/02/2019   TRIG 85.0 02/02/2019   HDL 98.00 02/02/2019   LDLDIRECT 96.0 11/06/2010   LDLCALC  88 02/02/2019   ALT 19 03/07/2020   AST 24 03/07/2020   NA 134 (L) 09/13/2020   K 3.7 09/13/2020   CL 100 09/13/2020   CREATININE 1.20 (H) 09/13/2020   BUN 22 09/13/2020   CO2 24 09/13/2020   TSH 2.07 09/14/2020   HGBA1C 5.3 02/02/2019         Assessment & Plan:   Physical exam: Screening blood work  ordered Exercise  regular - walking 6 miles Weight  normal Substance abuse  none   Reviewed recommended immunizations.   Health Maintenance  Topic Date Due   Zoster Vaccines- Shingrix (1 of 2) Never done   Medicare Annual Wellness (AWV)  10/06/2022   COVID-19 Vaccine (7 - 2023-24 season) 08/22/2022 (Originally 12/27/2021)   INFLUENZA VACCINE  09/12/2022   DEXA SCAN  02/23/2023   MAMMOGRAM  03/14/2023   Colonoscopy  12/31/2023   DTaP/Tdap/Td (2 - Td or Tdap) 04/22/2027   Pneumonia Vaccine 64+ Years old  Completed   Hepatitis C Screening  Completed   HPV VACCINES  Aged Out          See Problem List for Assessment and Plan of chronic medical problems.

## 2022-08-06 NOTE — Patient Instructions (Addendum)
Blood work was ordered.   The lab is on the first floor.    Medications changes include :   none    A ct of your coronary arteries was ordered.   A kidney ultrasound was ordered.    Return in about 1 year (around 08/07/2023) for Physical Exam.    Health Maintenance, Female Adopting a healthy lifestyle and getting preventive care are important in promoting health and wellness. Ask your health care provider about: The right schedule for you to have regular tests and exams. Things you can do on your own to prevent diseases and keep yourself healthy. What should I know about diet, weight, and exercise? Eat a healthy diet  Eat a diet that includes plenty of vegetables, fruits, low-fat dairy products, and lean protein. Do not eat a lot of foods that are high in solid fats, added sugars, or sodium. Maintain a healthy weight Body mass index (BMI) is used to identify weight problems. It estimates body fat based on height and weight. Your health care provider can help determine your BMI and help you achieve or maintain a healthy weight. Get regular exercise Get regular exercise. This is one of the most important things you can do for your health. Most adults should: Exercise for at least 150 minutes each week. The exercise should increase your heart rate and make you sweat (moderate-intensity exercise). Do strengthening exercises at least twice a week. This is in addition to the moderate-intensity exercise. Spend less time sitting. Even light physical activity can be beneficial. Watch cholesterol and blood lipids Have your blood tested for lipids and cholesterol at 75 years of age, then have this test every 5 years. Have your cholesterol levels checked more often if: Your lipid or cholesterol levels are high. You are older than 75 years of age. You are at high risk for heart disease. What should I know about cancer screening? Depending on your health history and family  history, you may need to have cancer screening at various ages. This may include screening for: Breast cancer. Cervical cancer. Colorectal cancer. Skin cancer. Lung cancer. What should I know about heart disease, diabetes, and high blood pressure? Blood pressure and heart disease High blood pressure causes heart disease and increases the risk of stroke. This is more likely to develop in people who have high blood pressure readings or are overweight. Have your blood pressure checked: Every 3-5 years if you are 55-31 years of age. Every year if you are 35 years old or older. Diabetes Have regular diabetes screenings. This checks your fasting blood sugar level. Have the screening done: Once every three years after age 30 if you are at a normal weight and have a low risk for diabetes. More often and at a younger age if you are overweight or have a high risk for diabetes. What should I know about preventing infection? Hepatitis B If you have a higher risk for hepatitis B, you should be screened for this virus. Talk with your health care provider to find out if you are at risk for hepatitis B infection. Hepatitis C Testing is recommended for: Everyone born from [birth year] through 1963. Anyone with known risk factors for hepatitis C. Sexually transmitted infections (STIs) Get screened for STIs, including gonorrhea and chlamydia, if: You are sexually active and are younger than 75 years of age. You are older than 75 years of age and your health care provider tells you that you are at risk for  this type of infection. Your sexual activity has changed since you were last screened, and you are at increased risk for chlamydia or gonorrhea. Ask your health care provider if you are at risk. Ask your health care provider about whether you are at high risk for HIV. Your health care provider may recommend a prescription medicine to help prevent HIV infection. If you choose to take medicine to prevent HIV, you  should first get tested for HIV. You should then be tested every 3 months for as long as you are taking the medicine. Pregnancy If you are about to stop having your period (premenopausal) and you may become pregnant, seek counseling before you get pregnant. Take 400 to 800 micrograms (mcg) of folic acid every day if you become pregnant. Ask for birth control (contraception) if you want to prevent pregnancy. Osteoporosis and menopause Osteoporosis is a disease in which the bones lose minerals and strength with aging. This can result in bone fractures. If you are 68 years old or older, or if you are at risk for osteoporosis and fractures, ask your health care provider if you should: Be screened for bone loss. Take a calcium or vitamin D supplement to lower your risk of fractures. Be given hormone replacement therapy (HRT) to treat symptoms of menopause. Follow these instructions at home: Alcohol use Do not drink alcohol if: Your health care provider tells you not to drink. You are pregnant, may be pregnant, or are planning to become pregnant. If you drink alcohol: Limit how much you have to: 0-1 drink a day. Know how much alcohol is in your drink. In the [place] one drink equals one 12 oz bottle of beer (355 mL), one 5 oz glass of wine (148 mL), or one 1 oz glass of hard liquor (44 mL). Lifestyle Do not use any products that contain nicotine or tobacco. These products include cigarettes, chewing tobacco, and vaping devices, such as e-cigarettes. If you need help quitting, ask your health care provider. Do not use street drugs. Do not share needles. Ask your health care provider for help if you need support or information about quitting drugs. General instructions Schedule regular health, dental, and eye exams. Stay current with your vaccines. Tell your health care provider if: You often feel depressed. You have ever been abused or do not feel safe at home. Summary Adopting a healthy  lifestyle and getting preventive care are important in promoting health and wellness. Follow your health care provider's instructions about healthy diet, exercising, and getting tested or screened for diseases. Follow your health care provider's instructions on monitoring your cholesterol and blood pressure. This information is not intended to replace advice given to you by your health care provider. Make sure you discuss any questions you have with your health care provider. Document Revised: 06/19/2020 Document Reviewed: 06/19/2020 Elsevier Patient Education  2024 [employer].

## 2022-08-07 ENCOUNTER — Ambulatory Visit (INDEPENDENT_AMBULATORY_CARE_PROVIDER_SITE_OTHER): Payer: PPO | Admitting: Internal Medicine

## 2022-08-07 ENCOUNTER — Telehealth: Payer: Self-pay | Admitting: Internal Medicine

## 2022-08-07 VITALS — BP 122/72 | HR 70 | Temp 98.5°F | Ht 60.0 in | Wt 100.0 lb

## 2022-08-07 DIAGNOSIS — M85851 Other specified disorders of bone density and structure, right thigh: Secondary | ICD-10-CM

## 2022-08-07 DIAGNOSIS — M85852 Other specified disorders of bone density and structure, left thigh: Secondary | ICD-10-CM | POA: Diagnosis not present

## 2022-08-07 DIAGNOSIS — F418 Other specified anxiety disorders: Secondary | ICD-10-CM | POA: Diagnosis not present

## 2022-08-07 DIAGNOSIS — E038 Other specified hypothyroidism: Secondary | ICD-10-CM | POA: Diagnosis not present

## 2022-08-07 DIAGNOSIS — R739 Hyperglycemia, unspecified: Secondary | ICD-10-CM

## 2022-08-07 DIAGNOSIS — M6289 Other specified disorders of muscle: Secondary | ICD-10-CM

## 2022-08-07 DIAGNOSIS — Z0000 Encounter for general adult medical examination without abnormal findings: Secondary | ICD-10-CM

## 2022-08-07 DIAGNOSIS — Z136 Encounter for screening for cardiovascular disorders: Secondary | ICD-10-CM

## 2022-08-07 DIAGNOSIS — E782 Mixed hyperlipidemia: Secondary | ICD-10-CM | POA: Diagnosis not present

## 2022-08-07 DIAGNOSIS — I10 Essential (primary) hypertension: Secondary | ICD-10-CM

## 2022-08-07 DIAGNOSIS — N1831 Chronic kidney disease, stage 3a: Secondary | ICD-10-CM | POA: Diagnosis not present

## 2022-08-07 LAB — CBC WITH DIFFERENTIAL/PLATELET
Basophils Absolute: 0.1 10*3/uL (ref 0.0–0.1)
Basophils Relative: 1.1 % (ref 0.0–3.0)
Eosinophils Absolute: 0 10*3/uL (ref 0.0–0.7)
Eosinophils Relative: 0.6 % (ref 0.0–5.0)
HCT: 40.9 % (ref 36.0–46.0)
Hemoglobin: 13.5 g/dL (ref 12.0–15.0)
Lymphocytes Relative: 34.8 % (ref 12.0–46.0)
Lymphs Abs: 1.9 10*3/uL (ref 0.7–4.0)
MCHC: 33.2 g/dL (ref 30.0–36.0)
MCV: 95.8 fl (ref 78.0–100.0)
Monocytes Absolute: 0.4 10*3/uL (ref 0.1–1.0)
Monocytes Relative: 7.5 % (ref 3.0–12.0)
Neutro Abs: 3.1 10*3/uL (ref 1.4–7.7)
Neutrophils Relative %: 56 % (ref 43.0–77.0)
Platelets: 217 10*3/uL (ref 150.0–400.0)
RBC: 4.26 Mil/uL (ref 3.87–5.11)
RDW: 12 % (ref 11.5–15.5)
WBC: 5.6 10*3/uL (ref 4.0–10.5)

## 2022-08-07 LAB — LIPID PANEL
Cholesterol: 206 mg/dL — ABNORMAL HIGH (ref 0–200)
HDL: 100.4 mg/dL (ref >39.00)
LDL Cholesterol: 94 mg/dL (ref 0–99)
NonHDL: 105.45
Total CHOL/HDL Ratio: 2
Triglycerides: 58 mg/dL (ref 0.0–149.0)
VLDL: 11.6 mg/dL (ref 0.0–40.0)

## 2022-08-07 LAB — COMPREHENSIVE METABOLIC PANEL
ALT: 18 U/L (ref 0–35)
AST: 26 U/L (ref 0–37)
Albumin: 4.1 g/dL (ref 3.5–5.2)
Alkaline Phosphatase: 57 U/L (ref 39–117)
BUN: 22 mg/dL (ref 6–23)
CO2: 31 mEq/L (ref 19–32)
Calcium: 9.7 mg/dL (ref 8.4–10.5)
Chloride: 98 mEq/L (ref 96–112)
Creatinine, Ser: 1.13 mg/dL (ref 0.40–1.20)
GFR: 47.67 mL/min — ABNORMAL LOW (ref >60.00)
Glucose, Bld: 91 mg/dL (ref 70–99)
Potassium: 4 mEq/L (ref 3.5–5.1)
Sodium: 136 mEq/L (ref 135–145)
Total Bilirubin: 1 mg/dL (ref 0.2–1.2)
Total Protein: 7 g/dL (ref 6.0–8.3)

## 2022-08-07 LAB — VITAMIN D 25 HYDROXY (VIT D DEFICIENCY, FRACTURES): VITD: 63.59 ng/mL (ref 30.00–100.00)

## 2022-08-07 LAB — TSH: TSH: 1.49 u[IU]/mL (ref 0.35–5.50)

## 2022-08-07 NOTE — Assessment & Plan Note (Signed)
Chronic BP well controlled Cmp, cbc Continue hydrochlorothiazide 25 mg daily, metoprolol XL 25 mg daily

## 2022-08-07 NOTE — Assessment & Plan Note (Signed)
Chronic  Clinically euthyroid Continue levothyroxine 75 mcg daily Check tsh  Titrate med dose if needed

## 2022-08-07 NOTE — Assessment & Plan Note (Signed)
Mild hyperglycemia in the past Check A1c 

## 2022-08-07 NOTE — Assessment & Plan Note (Signed)
Chronic Kidney function has been decreased for years, but very stable Low risk for progression She does not take any NSAIDs and takes Tylenol medication She has increased her water intake Blood pressure well-controlled Sugars controlled Will avoid nephrotoxic drugs Will obtain renal ultrasound

## 2022-08-07 NOTE — Assessment & Plan Note (Signed)
Chronic Dexa up to date Walking daily Taking calcium and vitamin d daily Check vitamin d daily Was on estradiol at the time of the next - no longer on - this makes the frax calculation invalid Does not need medication at this time

## 2022-08-07 NOTE — Assessment & Plan Note (Signed)
Remote history of smoking, hypertension (well-controlled) and major risk factors Cholesterol well-controlled with lifestyle Will obtain CT coronary calcium score test to evaluate risk of better Check lipids today Continue regular exercise, healthy diet

## 2022-08-07 NOTE — Telephone Encounter (Signed)
Patient needs her meds sent to [facility] - phone # [phone]

## 2022-08-07 NOTE — Assessment & Plan Note (Addendum)
Chronic Situational-intermittent anxiety Continue lorazepam 0.5 mg twice daily as needed-rarely takes and has not taken in a long time

## 2022-08-07 NOTE — Assessment & Plan Note (Signed)
Chronic Check lipid panel, CMP Diet controlled Continue healthy diet and regular exercise

## 2022-08-07 NOTE — Assessment & Plan Note (Signed)
Does have some urinary, stool incontinence at times Likely pelvic floor muscle weakness Discussed pelvic PT-she would be interested in doing this at some point after her move Will refer to PT at [facility] once ready

## 2022-08-08 ENCOUNTER — Other Ambulatory Visit: Payer: Self-pay | Admitting: Internal Medicine

## 2022-08-08 DIAGNOSIS — I10 Essential (primary) hypertension: Secondary | ICD-10-CM

## 2022-08-11 DIAGNOSIS — S80861A Insect bite (nonvenomous), right lower leg, initial encounter: Secondary | ICD-10-CM | POA: Diagnosis not present

## 2022-08-11 DIAGNOSIS — W57XXXA Bitten or stung by nonvenomous insect and other nonvenomous arthropods, initial encounter: Secondary | ICD-10-CM | POA: Diagnosis not present

## 2022-08-11 DIAGNOSIS — B029 Zoster without complications: Secondary | ICD-10-CM | POA: Diagnosis not present

## 2022-09-02 ENCOUNTER — Telehealth: Payer: Self-pay | Admitting: Internal Medicine

## 2022-09-02 ENCOUNTER — Other Ambulatory Visit: Payer: Self-pay | Admitting: Internal Medicine

## 2022-09-02 DIAGNOSIS — U071 COVID-19: Secondary | ICD-10-CM | POA: Insufficient documentation

## 2022-09-02 MED ORDER — NIRMATRELVIR/RITONAVIR (PAXLOVID) TABLET (RENAL DOSING): 2.0000 | ORAL_TABLET | Freq: Two times a day (BID) | ORAL | 0 refills | Status: AC

## 2022-09-02 NOTE — Telephone Encounter (Signed)
Pt called in sating he tested positive for COVID on yesterday and wanted to know if he could be prescribed something. Please advise

## 2022-09-04 ENCOUNTER — Other Ambulatory Visit: Payer: PPO

## 2022-09-10 ENCOUNTER — Ambulatory Visit (HOSPITAL_BASED_OUTPATIENT_CLINIC_OR_DEPARTMENT_OTHER)
Admission: RE | Admit: 2022-09-10 | Discharge: 2022-09-10 | Disposition: A | Discharge status: Home / Self Care | Payer: PPO | Source: Ambulatory Visit | Attending: Internal Medicine | Admitting: Internal Medicine

## 2022-09-10 DIAGNOSIS — Z136 Encounter for screening for cardiovascular disorders: Secondary | ICD-10-CM | POA: Insufficient documentation

## 2022-09-11 DIAGNOSIS — M542 Cervicalgia: Secondary | ICD-10-CM | POA: Diagnosis not present

## 2022-09-12 ENCOUNTER — Ambulatory Visit
Admission: RE | Admit: 2022-09-12 | Discharge: 2022-09-12 | Disposition: A | Discharge status: Home / Self Care | Payer: PPO | Source: Ambulatory Visit | Attending: Internal Medicine | Admitting: Internal Medicine

## 2022-09-12 ENCOUNTER — Encounter (HOSPITAL_BASED_OUTPATIENT_CLINIC_OR_DEPARTMENT_OTHER): Payer: Self-pay | Admitting: Emergency Medicine

## 2022-09-12 ENCOUNTER — Other Ambulatory Visit: Payer: Self-pay

## 2022-09-12 ENCOUNTER — Emergency Department (HOSPITAL_BASED_OUTPATIENT_CLINIC_OR_DEPARTMENT_OTHER)
Admission: EM | Admit: 2022-09-12 | Discharge: 2022-09-12 | Disposition: A | Discharge status: Home / Self Care | Payer: PPO | Attending: Emergency Medicine | Admitting: Emergency Medicine

## 2022-09-12 DIAGNOSIS — N289 Disorder of kidney and ureter, unspecified: Secondary | ICD-10-CM | POA: Diagnosis not present

## 2022-09-12 DIAGNOSIS — N1831 Chronic kidney disease, stage 3a: Secondary | ICD-10-CM

## 2022-09-12 DIAGNOSIS — R21 Rash and other nonspecific skin eruption: Secondary | ICD-10-CM | POA: Diagnosis not present

## 2022-09-12 MED ORDER — HYDROXYZINE HCL 25 MG PO TABS: 25.0000 mg | ORAL_TABLET | Freq: Four times a day (QID) | ORAL | 0 refills | Status: DC | PRN

## 2022-09-12 MED ORDER — PREDNISONE 20 MG PO TABS: 40.0000 mg | ORAL_TABLET | Freq: Every day | ORAL | 0 refills | Status: DC

## 2022-09-12 NOTE — ED Triage Notes (Signed)
Pt in with itchy rash that began yesterday on her R foot, now has blisters to this area. Rash has spread to torso and mostly gathered in pelvic area. Pt states she has been applying Benadryl cream and taking PO Benadryl tabs with no relief. Denies any throat itching or sob

## 2022-09-12 NOTE — ED Provider Notes (Signed)
[place] EMERGENCY DEPARTMENT AT [facility] Provider Note   CSN: [id number] Arrival date & time: 09/12/22  4098     History  Chief Complaint  Patient presents with   Rash    Renee Santos is a 77 y.o. female.  She is here with complaint of an itchy rash that started on her feet and ankles and now is also involving her groin it has been going on for the last day or so.  No clear new exposure but had moved into an apartment at [facility] waiting for their place to be ready.  Husband does not have any similar rash.  No new medications or different soaps or anything else that she can think of.  Has tried Benadryl without improvement of the itching.  No fevers nausea vomiting.  The history is provided by the patient.  Rash Location:  Leg Leg rash location:  R foot, L foot, R ankle and L ankle Quality: itchiness and redness   Quality: not draining   Onset quality:  Gradual Duration:  2 days Timing:  Constant Chronicity:  New Relieved by:  Nothing Ineffective treatments:  Antihistamines and anti-itch cream Associated symptoms: no abdominal pain, no fever, no nausea and not vomiting        Home Medications Prior to Admission medications   Medication Sig Start Date End Date Taking? Authorizing Provider  BIOTIN FORTE PO Take 1 tablet by mouth.    [provider]  Calcium Carbonate-Vit D-Min (CALTRATE PLUS PO) Take by mouth daily.      [provider]  clobetasol cream (TEMOVATE) 0.05 % Apply 1 application topically 2 (two) times daily. Patient taking differently: Apply 1 application  topically 2 (two) times daily as needed. 05/01/15   Pincus Sanes, MD  hydrochlorothiazide (HYDRODIURIL) 25 MG tablet Take 1/2 tablet by mouth daily 08/08/22   Pincus Sanes, MD  levothyroxine (SYNTHROID) 75 MCG tablet Take 1 tablet by mouth 6 days a week 08/08/22   Pincus Sanes, MD  LORazepam (ATIVAN) 0.5 MG tablet Take 1 tablet (0.5 mg total) by mouth 2 (two) times daily  as needed. for anxiety 01/26/21   Pincus Sanes, MD  metoprolol succinate (TOPROL-XL) 50 MG 24 hr tablet Take 1/2 tablet by mouth every day 08/08/22   Pincus Sanes, MD  multivitamin-lutein [facility]) CAPS capsule Take 1 capsule by mouth daily.    [provider]  valACYclovir (VALTREX) 1000 MG tablet Take 1,000 mg by mouth daily. 12/28/20   [provider]      Allergies    Patient has no known allergies.    Review of Systems   Review of Systems  Constitutional:  Negative for fever.  Gastrointestinal:  Negative for abdominal pain, nausea and vomiting.  Skin:  Positive for rash.    Physical Exam Updated Vital Signs BP (!) 152/79   Pulse 64   Resp 18   Wt 46.3 kg   SpO2 100%   BMI 19.92 kg/m  Physical Exam Vitals and nursing note reviewed.  Constitutional:      Appearance: Normal appearance. She is well-developed.  HENT:     Head: Normocephalic and atraumatic.  Eyes:     Conjunctiva/sclera: Conjunctivae normal.  Musculoskeletal:        General: No deformity. Normal range of motion.     Cervical back: Neck supple.  Skin:    General: Skin is warm and dry.     Findings: Rash present.  Comments: She has multiple papules on her feet and ankles and and few up in her groin.  Do not see any linear excoriations.  No drainage.  Neurological:     General: No focal deficit present.     Mental Status: She is alert.     GCS: GCS eye subscore is 4. GCS verbal subscore is 5. GCS motor subscore is 6.     ED Results / Procedures / Treatments   Labs (all labs ordered are listed, but only abnormal results are displayed) Labs Reviewed - No data to display  EKG None  Radiology CT CARDIAC SCORING (SELF PAY ONLY)  Result Date: 09/10/2022 CLINICAL DATA:  Cardiovascular Disease Risk stratification EXAM: Coronary Calcium Score TECHNIQUE: A gated, non-contrast computed tomography scan of the heart was performed using 3mm slice thickness. Axial images were  analyzed on a dedicated workstation. Calcium scoring of the coronary arteries was performed using the Agatston method. FINDINGS: Coronary arteries: Normal origins. Coronary Calcium Score: Total: 0 Pericardium: Normal. Ascending Aorta: Normal caliber.  Mild aortic atherosclerosis. Moderate mitral annular calcification Non-cardiac: See separate report from [facility] Radiology. IMPRESSION: 1. Coronary calcium score of 0. This was <1 percentile for age-, race-, and sex-matched controls. 2. Moderate mitral annular calcification. 3. Mild aortic atherosclerosis. RECOMMENDATIONS: Coronary artery calcium (CAC) score is a strong predictor of incident coronary heart disease (CHD) and provides predictive information beyond traditional risk factors. CAC scoring is reasonable to use in the decision to withhold, postpone, or initiate statin therapy in intermediate-risk or selected borderline-risk asymptomatic adults (age 57-75 years and LDL-C >=70 to <190 mg/dL) who do not have diabetes or established atherosclerotic cardiovascular disease (ASCVD).* In intermediate-risk (10-year ASCVD risk >=7.5% to <20%) adults or selected borderline-risk (10-year ASCVD risk >=5% to <7.5%) adults in whom a CAC score is measured for the purpose of making a treatment decision the following recommendations have been made: If CAC=0, it is reasonable to withhold statin therapy and reassess in 5 to 10 years, as long as higher risk conditions are absent (diabetes mellitus, family history of premature CHD in first degree relatives (males <55 years; females <65 years), cigarette smoking, or LDL >=190 mg/dL). If CAC is 1 to 99, it is reasonable to initiate statin therapy for patients >=30 years of age. If CAC is >=100 or >=75th percentile, it is reasonable to initiate statin therapy at any age. Cardiology referral should be considered for patients with CAC scores >=400 or >=75th percentile. *2018 AHA/ACC/AACVPR/AAPA/ABC/ACPM/ADA/AGS/APhA/ASPC/NLA/PCNA  Guideline on the Management of Blood Cholesterol: A Report of the American College of Cardiology/American Heart Association Task Force on Clinical Practice Guidelines. J Am Coll Cardiol. 2019;73(24):3168-3209. Electronically Signed   By: Donato Schultz M.D.   On: 09/10/2022 15:25    Procedures Procedures    Medications Ordered in ED Medications - No data to display  ED Course/ Medical Decision Making/ A&P                                 Medical Decision Making Risk Prescription drug management.   This patient complains of rash on feet and ankles groin; this involves an extensive number of treatment Options and is a complaint that carries with it a high risk of complications and morbidity. The differential includes allergic reaction, insect bites, contact dermatitis Previous records obtained and reviewed in epic no recent admissions Social determinants considered, no significant barriers Critical Interventions: None  After the interventions stated above,  I reevaluated the patient and found patient to be scratching but otherwise well-appearing Admission and further testing considered, no indications for admission or further workup at this time.  Will cover with some prednisone and Atarax.  Asked her to investigate with maybe some fleas or other insects in her house that could be causing her symptoms.         Final Clinical Impression(s) / ED Diagnoses Final diagnoses:  Rash and nonspecific skin eruption    Rx / DC Orders ED Discharge Orders          Ordered    predniSONE (DELTASONE) 20 MG tablet  Daily        09/12/22 0727    hydrOXYzine (ATARAX) 25 MG tablet  Every 6 hours PRN        09/12/22 0727              Terrilee Files, MD 09/12/22 [phone]

## 2022-09-12 NOTE — Discharge Instructions (Signed)
You were seen in the emergency department for evaluation of a rash.  These look like possible flea or other insect bites.  Please evaluate your lodging for any sign of fleas or other critters.  We are putting you on some steroids and some itch medicine.  Follow-up with your primary care doctor.  Return to the emergency department if any worsening or concerning symptoms.

## 2022-09-14 ENCOUNTER — Other Ambulatory Visit: Payer: Self-pay | Admitting: Internal Medicine

## 2022-09-14 DIAGNOSIS — N2889 Other specified disorders of kidney and ureter: Secondary | ICD-10-CM

## 2022-09-19 ENCOUNTER — Telehealth: Payer: Self-pay

## 2022-09-19 NOTE — Telephone Encounter (Signed)
Transition Care Management Unsuccessful Follow-up Telephone Call  Date of discharge and from where:  [facility] 8/1  Attempts:  2nd Attempt  Reason for unsuccessful TCM follow-up call:  No answer/busy   Lenard Forth [facility] Guide, [facility] [phone] [address] [place], [place], [place] [address] Phone: [phone] Email: [email or web address]

## 2022-09-19 NOTE — Telephone Encounter (Signed)
Transition Care Management Unsuccessful Follow-up Telephone Call  Date of discharge and from where:  [facility] 8/1  Attempts:  1st Attempt  Reason for unsuccessful TCM follow-up call:  No answer/busy   Lenard Forth [facility] Guide, [facility] [phone] [address] [place], [place], [place] [address] Phone: [phone] Email: [email or web address]

## 2022-09-23 DIAGNOSIS — M542 Cervicalgia: Secondary | ICD-10-CM | POA: Diagnosis not present

## 2022-09-25 NOTE — Progress Notes (Unsigned)
Subjective:    Patient ID: Renee Santos, female    DOB: 16-Feb-1945, 77 y.o.   MRN: [id number]     HPI Renee Santos is here for follow up of her chronic medical problems.  ED 09/12/2022 for rash  She had an itchy rash that started on her feet and ankles and spread to her groin.  It started within the past day or so prior to her arrival.  No obvious cause.  No new products or medication.  She tried Benadryl and anti-itch creams at home without improvement.  No other symptoms.  In the ED she had multiple papules on her feet, ankles and a few up in her groin.  No linear excoriations.  She was prescribed prednisone and hydroxyzine.  She did find out the cause of the rash-it was actually bedbugs.  She was staying someplace new and did see one of the bugs.  The problem has been resolved.  The rash is gone, she still has a psychosomatic itch from just thinking about it.  Questions about CT scan but I had ordered-mass/cyst seen in kidney.   Medications and allergies reviewed with patient and updated if appropriate.  Current Outpatient Medications on File Prior to Visit  Medication Sig Dispense Refill   BIOTIN FORTE PO Take 1 tablet by mouth.     Calcium Carbonate-Vit D-Min (CALTRATE PLUS PO) Take by mouth daily.       clobetasol cream (TEMOVATE) 0.05 % Apply 1 application topically 2 (two) times daily. (Patient taking differently: Apply 1 application  topically 2 (two) times daily as needed.) 45 g 1   hydrochlorothiazide (HYDRODIURIL) 25 MG tablet Take 1/2 tablet by mouth daily 45 tablet 3   hydrOXYzine (ATARAX) 25 MG tablet Take 1 tablet (25 mg total) by mouth every 6 (six) hours as needed for itching. 12 tablet 0   levothyroxine (SYNTHROID) 75 MCG tablet Take 1 tablet by mouth 6 days a week 77 tablet 3   LORazepam (ATIVAN) 0.5 MG tablet Take 1 tablet (0.5 mg total) by mouth 2 (two) times daily as needed. for anxiety 15 tablet 0   metoprolol succinate (TOPROL-XL) 50 MG 24 hr tablet Take 1/2  tablet by mouth every day 45 tablet 3   multivitamin-lutein (OCUVITE-LUTEIN) CAPS capsule Take 1 capsule by mouth daily.     predniSONE (DELTASONE) 20 MG tablet Take 2 tablets (40 mg total) by mouth daily. 10 tablet 0   valACYclovir (VALTREX) 1000 MG tablet Take 1,000 mg by mouth daily.     No current facility-administered medications on file prior to visit.     Review of Systems     Objective:   Vitals:   09/26/22 0912  Pulse: 69  Temp: 98.1 F (36.7 C)  SpO2: 100%   BP Readings from Last 3 Encounters:  09/12/22 134/65  08/07/22 122/72  07/11/21 118/76   Wt Readings from Last 3 Encounters:  09/26/22 101 lb (45.8 kg)  09/12/22 102 lb (46.3 kg)  08/07/22 100 lb (45.4 kg)   Body mass index is 19.73 kg/m.    Physical Exam Constitutional:      General: She is not in acute distress.    Appearance: Normal appearance. She is not ill-appearing.  HENT:     Head: Normocephalic and atraumatic.  Skin:    General: Skin is warm and dry.     Comments: A few fading papules seen on ankles  Neurological:     Mental Status: She is alert.  Lab Results  Component Value Date   WBC 5.6 08/07/2022   HGB 13.5 08/07/2022   HCT 40.9 08/07/2022   PLT 217.0 08/07/2022   GLUCOSE 91 08/07/2022   CHOL 206 (H) 08/07/2022   TRIG 58.0 08/07/2022   HDL 100.40 08/07/2022   LDLDIRECT 96.0 11/06/2010   LDLCALC 94 08/07/2022   ALT 18 08/07/2022   AST 26 08/07/2022   NA 136 08/07/2022   K 4.0 08/07/2022   CL 98 08/07/2022   CREATININE 1.13 08/07/2022   BUN 22 08/07/2022   CO2 31 08/07/2022   TSH 1.49 08/07/2022   HGBA1C 5.3 02/02/2019     Assessment & Plan:    See Problem List for Assessment and Plan of chronic medical problems.    I spent 20 minutes dedicated to the care of this patient on the date of this encounter including review of recent labs, imaging and procedures, obtaining history, communicating with the patient, ordering referrals and documenting clinical  information in the EHR

## 2022-09-25 NOTE — Patient Instructions (Addendum)
     Have blood work today   Medications changes include :   Paxlovid 3 tabs twice daily for 5 day.  Hold crestor while on paxlovid.   Decrease the amlodipine to 5 mg while taking paxlovid.    Return if symptoms worsen or fail to improve.    

## 2022-09-26 ENCOUNTER — Other Ambulatory Visit: Payer: Self-pay | Admitting: Internal Medicine

## 2022-09-26 ENCOUNTER — Ambulatory Visit (INDEPENDENT_AMBULATORY_CARE_PROVIDER_SITE_OTHER): Payer: PPO | Admitting: Internal Medicine

## 2022-09-26 VITALS — BP 146/90 | HR 69 | Temp 98.1°F | Ht 60.0 in | Wt 101.0 lb

## 2022-09-26 DIAGNOSIS — R21 Rash and other nonspecific skin eruption: Secondary | ICD-10-CM | POA: Diagnosis not present

## 2022-09-26 DIAGNOSIS — N1831 Chronic kidney disease, stage 3a: Secondary | ICD-10-CM | POA: Diagnosis not present

## 2022-09-26 DIAGNOSIS — N39498 Other specified urinary incontinence: Secondary | ICD-10-CM | POA: Diagnosis not present

## 2022-09-26 DIAGNOSIS — R32 Unspecified urinary incontinence: Secondary | ICD-10-CM | POA: Insufficient documentation

## 2022-09-26 DIAGNOSIS — N2889 Other specified disorders of kidney and ureter: Secondary | ICD-10-CM

## 2022-09-26 NOTE — Assessment & Plan Note (Signed)
Chronic Has had issues with urinary incontinence for a while Possible pelvic floor dysfunction She is interested in further evaluation Referral to urogynecology

## 2022-09-26 NOTE — Assessment & Plan Note (Signed)
Resolved Ended up being bedbugs Few residual papules, but otherwise no symptoms

## 2022-09-26 NOTE — Assessment & Plan Note (Signed)
Chronic Mild Has been fairly stable for years Unsure of cause Low risk for progression She only takes Tylenol and will continue to avoid NSAIDs She does have good water intake Blood pressure well-controlled-could consider discontinuing hydrochlorothiazide, but she is on a low-dose Recent renal ultrasound with possible angiomyolipoma-CT scan ordered She is concerned about decreased kidney function-referral to nephrology

## 2022-10-07 ENCOUNTER — Ambulatory Visit: Payer: PPO

## 2022-10-07 VITALS — Ht 60.0 in | Wt 102.0 lb

## 2022-10-07 DIAGNOSIS — Z0000 Encounter for general adult medical examination without abnormal findings: Secondary | ICD-10-CM | POA: Diagnosis not present

## 2022-10-07 NOTE — Patient Instructions (Addendum)
Ms. Waxler , Thank you for taking time to come for your Medicare Wellness Visit. I appreciate your ongoing commitment to your health goals. Please review the following plan we discussed and let me know if I can assist you in the future.   Referrals/Orders/Follow-Ups/Clinician Recommendations: No  This is a list of the screening recommended for you and due dates:  Health Maintenance  Topic Date Due   Zoster (Shingles) Vaccine (1 of 2) 05/02/1997   COVID-19 Vaccine (7 - 2023-24 season) 12/27/2021   Flu Shot  05/12/2023*   DEXA scan (bone density measurement)  02/23/2023   Mammogram  03/14/2023   Medicare Annual Wellness Visit  10/07/2023   Colon Cancer Screening  12/31/2023   DTaP/Tdap/Td vaccine (2 - Td or Tdap) 04/22/2027   Pneumonia Vaccine  Completed   Hepatitis C Screening  Completed   HPV Vaccine  Aged Out  *Topic was postponed. The date shown is not the original due date.    Advanced directives: (Copy Requested) Please bring a copy of your health care power of attorney and living will to the office to be added to your chart at your convenience.  Next Medicare Annual Wellness Visit scheduled for next year: Yes

## 2022-10-07 NOTE — Progress Notes (Addendum)
Subjective:   Renee Santos is a 77 y.o. female who presents for Medicare Annual (Subsequent) preventive examination.  Visit Complete: Virtual  I connected with  Renee Santos on 10/07/22 by a audio enabled telemedicine application and verified that I am speaking with the correct person using two identifiers.  Patient Location: Home  Provider Location: Office/Clinic  I discussed the limitations of evaluation and management by telemedicine. The patient expressed understanding and agreed to proceed.  Patient Medicare AWV questionnaire was completed by the patient on 10/06/2022; I have confirmed that all information answered by patient is correct and no changes since this date.   Vital Signs: Because this visit was a virtual/telehealth visit, some criteria may be missing or patient reported. Any vitals not documented were not able to be obtained and vitals that have been documented are patient reported.   Review of Systems     Cardiac Risk Factors include: advanced age (>41men, >72 women);dyslipidemia;family history of premature cardiovascular disease;hypertension     Objective:    Today's Vitals   10/07/22 0846 10/07/22 0848  Weight: 102 lb (46.3 kg)   Height: 5' (1.524 m)   PainSc: 0-No pain 0-No pain   Body mass index is 19.92 kg/m.     10/07/2022    8:50 AM 09/12/2022    6:35 AM 10/05/2021    9:26 AM 09/13/2020    2:52 PM 09/13/2020    1:35 PM 06/17/2019   10:07 AM 12/30/2017    8:32 AM  Advanced Directives  Does Patient Have a Medical Advance Directive? Yes No Yes Yes Yes Yes Yes  Type of Estate agent of [place];Living will  Healthcare Power of [place];Living will Living will;Healthcare Power of Attorney Living will;Healthcare Power of [employer] Power of [place];Living will Healthcare Power of [place];Living will  Does patient want to make changes to medical advance directive?     No - Patient declined No - Patient declined   Copy of  Healthcare Power of Attorney in Chart? No - copy requested  No - copy requested No - copy requested No - copy requested No - copy requested No - copy requested  Would patient like information on creating a medical advance directive?  No - Patient declined         Current Medications (verified) Outpatient Encounter Medications as of 10/07/2022  Medication Sig   BIOTIN FORTE PO Take 1 tablet by mouth.   Calcium Carbonate-Vit D-Min (CALTRATE PLUS PO) Take by mouth daily.     clobetasol cream (TEMOVATE) 0.05 % Apply 1 application topically 2 (two) times daily. (Patient taking differently: Apply 1 application  topically 2 (two) times daily as needed.)   hydrochlorothiazide (HYDRODIURIL) 25 MG tablet Take 1/2 tablet by mouth daily   levothyroxine (SYNTHROID) 75 MCG tablet Take 1 tablet by mouth 6 days a week   LORazepam (ATIVAN) 0.5 MG tablet Take 1 tablet (0.5 mg total) by mouth 2 (two) times daily as needed. for anxiety   metoprolol succinate (TOPROL-XL) 50 MG 24 hr tablet Take 1/2 tablet by mouth every day   multivitamin-lutein (OCUVITE-LUTEIN) CAPS capsule Take 1 capsule by mouth daily.   valACYclovir (VALTREX) 1000 MG tablet Take 1,000 mg by mouth daily.   [DISCONTINUED] hydrOXYzine (ATARAX) 25 MG tablet Take 1 tablet (25 mg total) by mouth every 6 (six) hours as needed for itching.   [DISCONTINUED] predniSONE (DELTASONE) 20 MG tablet Take 2 tablets (40 mg total) by mouth daily.   No facility-administered encounter  medications on file as of 10/07/2022.    Allergies (verified) Patient has no known allergies.   History: Past Medical History:  Diagnosis Date   Cancer (HCC) 2011   Basal Cell of nose; Dr Swaziland   Herpes zoster 2008   posterior thorax   Hypertension    Neurofibroma of back 2013    Dr Amy Swaziland    Osteopenia 2016   T score -1.6 FRAX not calculated because of HRT   Psoriasis    Dr Amy Swaziland   Thyroid nodule    Past Surgical History:  Procedure Laterality Date    ABDOMINAL HYSTERECTOMY      LSO  & appendectomy for endometrioma   @ age38 for ovarian cyst   APPENDECTOMY     cataract Bilateral Dec 2016;   not sure of date; wears readers only    COLONOSCOPY  multiple   OOPHORECTOMY     LSO for cyst   TONSILLECTOMY     Family History  Problem Relation Age of Onset   Hypertension Father    Lung cancer Father        remote smoker, deceased 1   Hyperlipidemia Mother        CABG in late 59s   Hypothyroidism Mother    Macular degeneration Mother    Hypertension Mother    Coronary artery disease Mother        bypass surgery   Stroke Mother 89       Deceased   Coronary artery disease Maternal Grandmother    Asthma Maternal Uncle        2   Asthma Maternal Grandfather    Diabetes Neg Hx    Heart attack Neg Hx    Colon cancer Neg Hx    Social History   Socioeconomic History   Marital status: Married    Spouse name: Not on file   Number of children: 2   Years of education: Not on file   Highest education level: Bachelor's degree (e.g., BA, AB, BS)  Occupational History   Not on file  Tobacco Use   Smoking status: Former   Smokeless tobacco: Never   Tobacco comments:    college only socially  Vaping Use   Vaping status: Never Used  Substance and Sexual Activity   Alcohol use: Yes    Alcohol/week: 6.0 standard drinks of alcohol    Types: 6 Glasses of wine per week   Drug use: No   Sexual activity: Yes    Birth control/protection: Surgical    Comment: HYST-1st intercourse 77 yo-Fewer than 5 partners  Other Topics Concern   Not on file  Social History Narrative   Lives with husband x 45 years in a two story home.  Has 2 children.     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     Education: college.   Social Determinants of Health   Financial Resource Strain: Low Risk  (10/07/2022)   Overall Financial Resource Strain (CARDIA)    Difficulty of Paying Living Expenses: Not hard at all  Food Insecurity: No Food Insecurity (10/07/2022)    Hunger Vital Sign    Worried About Running Out of Food in the Last Year: Never true    Ran Out of Food in the Last Year: Never true  Transportation Needs: No Transportation Needs (10/07/2022)   PRAPARE - Administrator, Civil Service (Medical): No    Lack of Transportation (Non-Medical): No  Physical Activity: Sufficiently Active (10/07/2022)  Exercise Vital Sign    Days of Exercise per Week: 6 days    Minutes of Exercise per Session: 120 min  Stress: No Stress Concern Present (10/07/2022)   [employer] of Occupational Health - Occupational Stress Questionnaire    Feeling of Stress : Only a little  Recent Concern: Stress - Stress Concern Present (09/26/2022)   [employer] of Occupational Health - Occupational Stress Questionnaire    Feeling of Stress : To some extent  Social Connections: Socially Integrated (10/07/2022)   Social Connection and Isolation Panel [NHANES]    Frequency of Communication with Friends and Family: More than three times a week    Frequency of Social Gatherings with Friends and Family: Three times a week    Attends Religious Services: More than 4 times per year    Active Member of Clubs or Organizations: Yes    Attends Engineer, structural: More than 4 times per year    Marital Status: Married    Tobacco Counseling Counseling given: Not Answered Tobacco comments: college only socially   Clinical Intake:  Pre-visit preparation completed: Yes  Pain : No/denies pain Pain Score: 0-No pain     BMI - recorded: 19.92 Nutritional Status: BMI of 19-24  Normal Nutritional Risks: None Diabetes: No  How often do you need to have someone help you when you read instructions, pamphlets, or other written materials from your doctor or pharmacy?: 1 - Never What is the last grade level you completed in school?: Retired Health and safety inspector Needed?: No  Information entered by :: [employer]. Elainah Rhyne, LPN.   Activities of Daily  Living    10/07/2022    8:59 AM 10/06/2022    9:03 AM  In your present state of health, do you have any difficulty performing the following activities:  Hearing? 0 0  Vision? 0 0  Difficulty concentrating or making decisions? 0 0  Walking or climbing stairs? 0 0  Dressing or bathing? 0 0  Doing errands, shopping? 0 0  Preparing Food and eating ? N N  Using the Toilet? N N  In the past six months, have you accidently leaked urine? Y Y  Do you have problems with loss of bowel control? Y Y  Managing your Medications? N N  Managing your Finances? N N  Housekeeping or managing your Housekeeping? N N    Patient Care Team: Pincus Sanes, MD as PCP - General (Internal Medicine) Swaziland, Amy, MD as Consulting Physician (Dermatology) Fontaine, Nadyne Coombes, MD (Inactive) as Consulting Physician (Gynecology) Szabat, Vinnie Level, [facility] (Inactive) as Pharmacist (Pharmacist) Nelson Chimes, MD as Consulting Physician (Ophthalmology) Mickeal Needy, LPN as Licensed Practical Nurse (Internal Medicine)  Indicate any recent Medical Services you may have received from other than [facility] providers in the past year (date may be approximate).     Assessment:   This is a routine wellness examination for [facility].  Hearing/Vision screen Hearing Screening - Comments:: Denies hearing difficulties   Vision Screening - Comments:: Wears rx glasses - up to date with routine eye exams with [facility]   Dietary issues and exercise activities discussed:     Goals Addressed             This Visit's Progress    Patient Stated: Join health center at [facility] which would include yoga and stretching.        Depression Screen    10/07/2022    8:50 AM 09/26/2022  9:13 AM 08/07/2022   10:43 AM 10/05/2021    9:23 AM 10/10/2020   11:33 AM 09/13/2020    2:41 PM 06/17/2019   10:08 AM  PHQ 2/9 Scores  PHQ - 2 Score 0 0 0 0 0 0 0  PHQ- 9 Score 2 2   0      Fall Risk    10/07/2022    8:50 AM  10/06/2022    9:03 AM 09/26/2022    9:13 AM 08/07/2022   10:42 AM 10/05/2021    9:19 AM  Fall Risk   Falls in the past year? 0 0 0 0 0  Number falls in past yr: 0 0 0 0 0  Injury with Fall? 0 0 0 0 0  Risk for fall due to : No Fall Risks  No Fall Risks No Fall Risks   Follow up Falls prevention discussed  Falls evaluation completed Falls evaluation completed Falls evaluation completed;Education provided    MEDICARE RISK AT HOME: Medicare Risk at Home Any stairs in or around the home?: Yes If so, are there any without handrails?: No Home free of loose throw rugs in walkways, pet beds, electrical cords, etc?: Yes Adequate lighting in your home to reduce risk of falls?: Yes Life alert?: No Use of a cane, walker or w/c?: No Grab bars in the bathroom?: Yes Shower chair or bench in shower?: Yes Elevated toilet seat or a handicapped toilet?: Yes  TIMED UP AND GO:  Was the test performed?  No    Cognitive Function:    12/24/2016    8:18 AM  MMSE - Mini Mental State Exam  Orientation to time 5  Orientation to Place 5  Registration 3  Attention/ Calculation 3  Recall 2  Language- name 2 objects 2  Language- repeat 1  Language- follow 3 step command 3  Language- read & follow direction 1  Write a sentence 1  Copy design 1  Total score 27        10/07/2022    8:59 AM 10/05/2021    9:27 AM  6CIT Screen  What Year? 0 points 0 points  What month? 0 points 0 points  What time? 0 points 0 points  Count back from 20 0 points 0 points  Months in reverse 0 points 0 points  Repeat phrase 0 points 0 points  Total Score 0 points 0 points    Immunizations Immunization History  Administered Date(s) Administered   Fluad Quad(high Dose 65+) 10/07/2018, 11/20/2020, 11/01/2021   H1N1 03/01/2008   Hepatitis A 07/05/1997, 04/19/2004   Hepatitis B 05/30/2008, 07/05/2008, 03/27/2011   Influenza Split 12/04/2010, 11/05/2011   Influenza, High Dose Seasonal PF 12/27/2015, 10/08/2016,  11/03/2019   Influenza,inj,Quad PF,6+ Mos 12/03/2012, 12/15/2013, 12/21/2014, 12/01/2017   Influenza-Unspecified 10/30/2017, 11/11/2017   Meningococcal Conjugate 07/05/2008   PFIZER(Purple Top)SARS-COV-2 Vaccination 03/08/2019, 03/29/2019, 11/19/2019, 05/19/2020   Pfizer Covid-19 Vaccine Bivalent Booster 54yrs & up 01/05/2021, 11/01/2021   Pneumococcal Conjugate-13 06/15/2014   Pneumococcal Polysaccharide-23 04/13/2013   Tdap 04/21/2017   Tetanus 02/21/2014   Typhoid Inactivated 04/19/2004, 04/21/2017   Yellow Fever 05/30/2008   Zoster, Live 02/18/2014    TDAP status: Up to date  Flu Vaccine status: Up to date  Pneumococcal vaccine status: Up to date  Covid-19 vaccine status: Completed vaccines  Qualifies for Shingles Vaccine? Yes   Zostavax completed Yes   Shingrix Completed?: No.    Education has been provided regarding the importance of this vaccine. Patient has been advised  to call insurance company to determine out of pocket expense if they have not yet received this vaccine. Advised may also receive vaccine at local pharmacy or Health Dept. Verbalized acceptance and understanding.  Screening Tests Health Maintenance  Topic Date Due   Zoster Vaccines- Shingrix (1 of 2) 05/02/1997   COVID-19 Vaccine (7 - 2023-24 season) 12/27/2021   INFLUENZA VACCINE  05/12/2023 (Originally 09/12/2022)   DEXA SCAN  02/23/2023   MAMMOGRAM  03/14/2023   Medicare Annual Wellness (AWV)  10/07/2023   Colonoscopy  12/31/2023   DTaP/Tdap/Td (2 - Td or Tdap) 04/22/2027   Pneumonia Vaccine 60+ Years old  Completed   Hepatitis C Screening  Completed   HPV VACCINES  Aged Out    Health Maintenance  Health Maintenance Due  Topic Date Due   Zoster Vaccines- Shingrix (1 of 2) 05/02/1997   COVID-19 Vaccine (7 - 2023-24 season) 12/27/2021    Colorectal cancer screening: Type of screening: Colonoscopy. Completed 12/30/2013. Repeat every 10 years  Mammogram status: Completed 03/13/2022. Repeat  every year  Bone Density status: Completed 02/23/2020. Results reflect: Bone density results: OSTEOPENIA. Repeat every 2-3 years.  Lung Cancer Screening: (Low Dose CT Chest recommended if Age 66-80 years, 20 pack-year currently smoking OR have quit w/in 15years.) does not qualify.   Lung Cancer Screening Referral: no  Additional Screening:  Hepatitis C Screening: does qualify; Completed 12/12/2015  Vision Screening: Recommended annual ophthalmology exams for early detection of glaucoma and other disorders of the eye. Is the patient up to date with their annual eye exam?  Yes  Who is the provider or what is the name of the office in which the patient attends annual eye exams? [facility] If pt is not established with a provider, would they like to be referred to a provider to establish care? No .   Dental Screening: Recommended annual dental exams for proper oral hygiene  Diabetic Foot Exam: N/A  Community Resource Referral / Chronic Care Management: CRR required this visit?  No   CCM required this visit?  No     Plan:     I have personally reviewed and noted the following in the patient's chart:   Medical and social history Use of alcohol, tobacco or illicit drugs  Current medications and supplements including opioid prescriptions. Patient is not currently taking opioid prescriptions. Functional ability and status Nutritional status Physical activity Advanced directives List of other physicians Hospitalizations, surgeries, and ER visits in previous 12 months Vitals Screenings to include cognitive, depression, and falls Referrals and appointments  In addition, I have reviewed and discussed with patient certain preventive protocols, quality metrics, and best practice recommendations. A written personalized care plan for preventive services as well as general preventive health recommendations were provided to patient.     Mickeal Needy, LPN   07/09/4130   After  Visit Summary: (Mail) Due to this being a telephonic visit, the after visit summary with patients personalized plan was offered to patient via mail   Nurse Notes: Normal cognitive status assessed by direct observation via telephone conversation by this Nurse Health Advisor. No abnormalities found.

## 2022-10-21 DIAGNOSIS — M542 Cervicalgia: Secondary | ICD-10-CM | POA: Diagnosis not present

## 2022-10-22 ENCOUNTER — Other Ambulatory Visit: Payer: Self-pay | Admitting: Internal Medicine

## 2022-10-23 ENCOUNTER — Ambulatory Visit (HOSPITAL_BASED_OUTPATIENT_CLINIC_OR_DEPARTMENT_OTHER)
Admission: RE | Admit: 2022-10-23 | Discharge: 2022-10-23 | Disposition: A | Discharge status: Home / Self Care | Payer: PPO | Source: Ambulatory Visit | Attending: Internal Medicine | Admitting: Internal Medicine

## 2022-10-23 DIAGNOSIS — N2889 Other specified disorders of kidney and ureter: Secondary | ICD-10-CM | POA: Diagnosis not present

## 2022-10-23 DIAGNOSIS — N289 Disorder of kidney and ureter, unspecified: Secondary | ICD-10-CM | POA: Diagnosis not present

## 2022-10-23 DIAGNOSIS — D1803 Hemangioma of intra-abdominal structures: Secondary | ICD-10-CM | POA: Diagnosis not present

## 2022-10-23 LAB — POCT I-STAT CREATININE: Creatinine, Ser: 1 mg/dL (ref 0.44–1.00)

## 2022-10-23 MED ORDER — IOHEXOL 300 MG/ML  SOLN
100.0000 mL | Freq: Once | INTRAMUSCULAR | Status: AC | PRN
  Administered 2022-10-23: 85 mL via INTRAVENOUS

## 2022-10-24 MED ORDER — LORAZEPAM 0.5 MG PO TABS: 0.5000 mg | ORAL_TABLET | Freq: Two times a day (BID) | ORAL | 0 refills | Status: DC | PRN

## 2022-10-31 DIAGNOSIS — M25572 Pain in left ankle and joints of left foot: Secondary | ICD-10-CM | POA: Diagnosis not present

## 2022-11-05 ENCOUNTER — Encounter: Payer: Self-pay | Admitting: Obstetrics and Gynecology

## 2022-11-05 ENCOUNTER — Ambulatory Visit: Payer: PPO | Admitting: Obstetrics and Gynecology

## 2022-11-05 VITALS — BP 119/76 | HR 73 | Ht 61.5 in | Wt 101.0 lb

## 2022-11-05 DIAGNOSIS — R159 Full incontinence of feces: Secondary | ICD-10-CM | POA: Diagnosis not present

## 2022-11-05 DIAGNOSIS — R152 Fecal urgency: Secondary | ICD-10-CM

## 2022-11-05 DIAGNOSIS — N393 Stress incontinence (female) (male): Secondary | ICD-10-CM

## 2022-11-05 DIAGNOSIS — R32 Unspecified urinary incontinence: Secondary | ICD-10-CM | POA: Diagnosis not present

## 2022-11-05 DIAGNOSIS — N3281 Overactive bladder: Secondary | ICD-10-CM | POA: Diagnosis not present

## 2022-11-05 LAB — POCT URINALYSIS DIPSTICK
Bilirubin, UA: NEGATIVE
Glucose, UA: NEGATIVE
Ketones, UA: NEGATIVE
Leukocytes, UA: NEGATIVE
Nitrite, UA: NEGATIVE
Protein, UA: POSITIVE — AB
Spec Grav, UA: 1.01 (ref 1.010–1.025)
Urobilinogen, UA: 0.2 E.U./dL
pH, UA: 6.5 (ref 5.0–8.0)

## 2022-11-05 NOTE — Patient Instructions (Signed)
Let's start with pelvic floor Physical therapy and then do bladder testing

## 2022-11-05 NOTE — Progress Notes (Unsigned)
[facility] New Patient Evaluation and Consultation  Referring Provider: Pincus Sanes, MD PCP: Pincus Sanes, MD Date of Service: 11/05/2022  SUBJECTIVE Chief Complaint: No chief complaint on file.  History of Present Illness: Renee MUNNS is a 77 y.o. White or Caucasian female seen in consultation at the request of Dr. Lawerance Bach for evaluation of Incontinence.    ***Review of records significant for: ***  Urinary Symptoms: Leaks urine with without sensation Leaks Mainly when hiking Pad use: 2 pads per day.   Patient is bothered by UI symptoms.  Day time voids ***.  Nocturia: *** times per night to void. Voiding dysfunction:  empties bladder well.  Patient does not use a catheter to empty bladder.  When urinating, patient feels dribbling after finishing and the need to urinate multiple times in a row Drinks: *** per day  UTIs: 2 UTI's in the last year.   Denies history of blood in urine, kidney or bladder stones, pyelonephritis, bladder cancer, and kidney cancer No results found for the last 90 days.   Pelvic Organ Prolapse Symptoms:                  Patient Denies a feeling of a bulge the vaginal area. It has been present for {NUMBER 1-10:22536} {days/wks/mos/yrs:310907}.  Patient {denies/ admits to:24761} seeing a bulge.  This bulge {ACTION; IS/IS UJW:11914782} bothersome.  Bowel Symptom: Bowel movements: 4 time(s) per week Stool consistency: hard Straining: yes.  Splinting: no.  Incomplete evacuation: yes.  Patient Admits to accidental bowel leakage / fecal incontinence  Occurs: *** time(s) per {Time; day/week/month:13537}  Consistency with leakage: {stool consistency:24758} Bowel regimen: diet HM Colonoscopy          Colonoscopy (Every 10 Years) Next due on 12/31/2023    12/30/2013  Done - [facility]   Only the first 1 history entries have been loaded, but more history exists.            Sexual Function Sexually active: no.  Sexual orientation:  Straight Pain with sex: No  Pelvic Pain Denies pelvic pain    Past Medical History:  Past Medical History:  Diagnosis Date  . Cancer [facility]) 2011   Basal Cell of nose; Dr Swaziland  . Herpes zoster 2008   posterior thorax  . Hypertension   . Neurofibroma of back 2013    Dr Amy Swaziland   . Osteopenia 2016   T score -1.6 FRAX not calculated because of HRT  . Psoriasis    Dr Amy Swaziland  . Thyroid nodule      Past Surgical History:   Past Surgical History:  Procedure Laterality Date  . ABDOMINAL HYSTERECTOMY      LSO  & appendectomy for endometrioma   @ age38 for ovarian cyst  . APPENDECTOMY    . cataract Bilateral Dec 2016;   not sure of date; wears readers only   . COLONOSCOPY  multiple  . OOPHORECTOMY     LSO for cyst  . TONSILLECTOMY       Past OB/GYN History: [id number] Vaginal deliveries: ***,  Forceps/ Vacuum deliveries: ***, Cesarean section: *** Menopausal: Yes Contraception: ***. Last pap smear was 2023.  Any history of abnormal pap smears: no. HM PAP     This patient has no relevant Health Maintenance data.       Medications: Patient has a current medication list which includes the following prescription(s): biotin, calcium carbonate-vit d-min, clobetasol cream, hydrochlorothiazide, levothyroxine, lorazepam, metoprolol succinate, multivitamin-lutein, and valacyclovir.  Allergies: Patient has No Known Allergies.   Social History:  Social History   Tobacco Use  . Smoking status: Former  . Smokeless tobacco: Never  . Tobacco comments:    college only socially  Vaping Use  . Vaping status: Never Used  Substance Use Topics  . Alcohol use: Yes    Alcohol/week: 6.0 standard drinks of alcohol    Types: 6 Glasses of wine per week  . Drug use: No    Relationship status: married Patient lives with husband.   Patient is not employed. Regular exercise: Yes: *** History of abuse: No  Family History:   Family History  Problem Relation Age of Onset   . Hypertension Father   . Lung cancer Father        remote smoker, deceased 66  . Hyperlipidemia Mother        CABG in late 38s  . Hypothyroidism Mother   . Macular degeneration Mother   . Hypertension Mother   . Coronary artery disease Mother        bypass surgery  . Stroke Mother 13       Deceased  . Coronary artery disease Maternal Grandmother   . Asthma Maternal Uncle        2  . Asthma Maternal Grandfather   . Diabetes Neg Hx   . Heart attack Neg Hx   . Colon cancer Neg Hx      Review of Systems: ROS   OBJECTIVE Physical Exam: Vitals:   11/05/22 1113  BP: 119/76  Pulse: 73  Weight: 101 lb (45.8 kg)  Height: 5' 1.5" (1.562 m)    Physical Exam   GU / Detailed Urogynecologic Evaluation:  Pelvic Exam: Normal external female genitalia; Bartholin's and Skene's glands normal in appearance; urethral meatus normal in appearance, no urethral masses or discharge.   CST: {gen negative/positive:315881}  Reflexes: bulbocavernosis {DESC; PRESENT/NOT PRESENT:21021351}, anocutaneous {DESC; PRESENT/NOT PRESENT:21021351} ***bilaterally.  Speculum exam reveals normal vaginal mucosa {With/Without:20273} atrophy. Cervix {exam; gyn cervix:30847}. Uterus {exam; pelvic uterus:30849}. Adnexa {exam; adnexa:12223}.    s/p hysterectomy: Speculum exam reveals normal vaginal mucosa {With/Without:20273}  atrophy and normal vaginal cuff.  Adnexa {exam; adnexa:12223}.    With apex supported, anterior compartment defect was {reduced:24765}  Pelvic floor strength {Roman # I-V:19040}/V, puborectalis {Roman # I-V:19040}/V external anal sphincter {Roman # I-V:19040}/V  Pelvic floor musculature: Right levator {Tender/Non-tender:20250}, Right obturator {Tender/Non-tender:20250}, Left levator {Tender/Non-tender:20250}, Left obturator {Tender/Non-tender:20250}  POP-Q:   POP-Q                                               Aa                                               Ba                                                  C  Gh                                               Pb                                               tvl                                                Ap                                               Bp                                                 D      Rectal Exam:  Normal sphincter tone, {rectocele:24766} distal rectocele, enterocoele {DESC; PRESENT/NOT PRESENT:21021351}, no rectal masses, {sign of:24767} dyssynergia when asking the patient to bear down.  Post-Void Residual (PVR) by Bladder Scan: In order to evaluate bladder emptying, we discussed obtaining a postvoid residual and patient agreed to this procedure.  Procedure: The ultrasound unit was placed on the patient's abdomen in the suprapubic region after the patient had voided.      Laboratory Results: Lab Results  Component Value Date   COLORU Yellow 11/05/2022   CLARITYU Clear 11/05/2022   GLUCOSEUR Negative 11/05/2022   BILIRUBINUR Negative 11/05/2022   KETONESU Negative 11/05/2022   SPECGRAV 1.010 11/05/2022   RBCUR Trace 11/05/2022   PHUR 6.5 11/05/2022   PROTEINUR Positive (A) 11/05/2022   UROBILINOGEN 0.2 11/05/2022   LEUKOCYTESUR Negative 11/05/2022    Lab Results  Component Value Date   CREATININE 1.00 10/23/2022   CREATININE 1.13 08/07/2022   CREATININE 1.20 (H) 09/13/2020    Lab Results  Component Value Date   HGBA1C 5.3 02/02/2019    Lab Results  Component Value Date   HGB 13.5 08/07/2022     ASSESSMENT AND PLAN Ms. Bowlds is a 77 y.o. with:  1. Stress incontinence of urine       Selmer Dominion, NP

## 2022-11-07 ENCOUNTER — Other Ambulatory Visit (HOSPITAL_BASED_OUTPATIENT_CLINIC_OR_DEPARTMENT_OTHER): Payer: Self-pay | Admitting: Internal Medicine

## 2022-11-07 ENCOUNTER — Telehealth: Payer: Self-pay | Admitting: Internal Medicine

## 2022-11-07 ENCOUNTER — Encounter: Payer: Self-pay | Admitting: Internal Medicine

## 2022-11-07 DIAGNOSIS — N289 Disorder of kidney and ureter, unspecified: Secondary | ICD-10-CM | POA: Insufficient documentation

## 2022-11-07 DIAGNOSIS — N2889 Other specified disorders of kidney and ureter: Secondary | ICD-10-CM

## 2022-11-07 NOTE — Telephone Encounter (Signed)
Order changed - I actually wanted an mr abdomen and ordered it with  and w/o

## 2022-11-07 NOTE — Addendum Note (Signed)
Addended by: Pincus Sanes on: 11/07/2022 04:28 PM   Modules accepted: Orders

## 2022-11-07 NOTE — Telephone Encounter (Signed)
Erica from [facility] called about patient's MRI that was ordered. It is a liver MRI ordered with contrast. Per protocol, they said it needs to be with and without contrast. Alcario Drought would like a call back at [phone], extension [phone].

## 2022-11-13 DIAGNOSIS — M542 Cervicalgia: Secondary | ICD-10-CM | POA: Diagnosis not present

## 2022-11-14 DIAGNOSIS — M25572 Pain in left ankle and joints of left foot: Secondary | ICD-10-CM | POA: Insufficient documentation

## 2022-11-15 DIAGNOSIS — M25572 Pain in left ankle and joints of left foot: Secondary | ICD-10-CM | POA: Diagnosis not present

## 2022-11-20 ENCOUNTER — Ambulatory Visit
Admission: RE | Admit: 2022-11-20 | Discharge: 2022-11-20 | Disposition: A | Discharge status: Home / Self Care | Payer: PPO | Source: Ambulatory Visit | Attending: Internal Medicine | Admitting: Internal Medicine

## 2022-11-20 DIAGNOSIS — N2889 Other specified disorders of kidney and ureter: Secondary | ICD-10-CM

## 2022-11-20 MED ORDER — GADOPICLENOL 0.5 MMOL/ML IV SOLN
5.0000 mL | Freq: Once | INTRAVENOUS | Status: AC | PRN
  Administered 2022-11-20: 5 mL via INTRAVENOUS

## 2022-11-28 DIAGNOSIS — M25572 Pain in left ankle and joints of left foot: Secondary | ICD-10-CM | POA: Diagnosis not present

## 2022-12-05 DIAGNOSIS — M542 Cervicalgia: Secondary | ICD-10-CM | POA: Diagnosis not present

## 2022-12-10 ENCOUNTER — Other Ambulatory Visit: Payer: Self-pay | Admitting: Internal Medicine

## 2022-12-11 MED ORDER — LORAZEPAM 0.5 MG PO TABS: 0.5000 mg | ORAL_TABLET | Freq: Two times a day (BID) | ORAL | 2 refills | Status: DC | PRN

## 2022-12-16 NOTE — Therapy (Signed)
OUTPATIENT PHYSICAL THERAPY FEMALE PELVIC EVALUATION   Patient Name: Renee Santos MRN: [id number] DOB:May 14, 1945, 77 y.o., female Today's Date: 12/17/2022  END OF SESSION:  PT End of Session - 12/17/22 1934     Visit Number 1    PT Start Time 1615    PT Stop Time 1715    PT Time Calculation (min) 60 min    Activity Tolerance Patient tolerated treatment well    Behavior During Therapy [facility] for tasks assessed/performed             Past Medical History:  Diagnosis Date   Cancer (HCC) 2011   Basal Cell of nose; Dr [place]   Herpes zoster 2008   posterior thorax   Hypertension    Neurofibroma of back 2013    Dr Amy [place]    Osteopenia 2016   T score -1.6 FRAX not calculated because of HRT   Psoriasis    Dr Amy [place]   Thyroid nodule    Past Surgical History:  Procedure Laterality Date   ABDOMINAL HYSTERECTOMY      LSO  & appendectomy for endometrioma   @ age38 for ovarian cyst   APPENDECTOMY     cataract Bilateral Dec 2016;   not sure of date; wears readers only    COLONOSCOPY  multiple   OOPHORECTOMY     LSO for cyst   TONSILLECTOMY     Patient Active Problem List   Diagnosis Date Noted   Kidney lesion, native, right 11/07/2022   Urinary incontinence 09/26/2022   COVID 09/02/2022   Pelvic floor dysfunction 08/07/2022   Stage 3a chronic kidney disease (HCC) 08/07/2022   Screening for heart disease 08/07/2022   Posterior auricular pain of left ear 07/11/2021   Cellulitis of head except face 07/11/2021   Frequent UTI 02/27/2021   Atypical chest pain 10/10/2020   Constipation 10/10/2020   Rash 09/18/2020   Fecal incontinence 08/03/2019   Hyperglycemia 07/16/2016   Dizziness 07/16/2016   Lightheadedness 07/16/2016   Calcific tendonitis of right hand 05/01/2015   Herpes simplex 05/01/2015   Situational anxiety 05/01/2015   Essential hypertension 03/01/2008   Osteopenia 03/01/2008   THYROID NODULE 01/13/2007   Hypothyroidism 01/13/2007   HYPERLIPIDEMIA  01/13/2007   PSORIASIS 01/13/2007    PCP: Pincus Sanes, MD  REFERRING PROVIDER: Selmer Dominion, NP  REFERRING DIAG:  N39.3 (ICD-10-CM) - Stress incontinence of urine  N32.81 (ICD-10-CM) - Overactive bladder  R15.9,R15.2 (ICD-10-CM) - Incontinence of feces with fecal urgency    THERAPY DIAG:  Other lack of coordination  Muscle weakness (generalized)  Rationale for Evaluation and Treatment: Rehabilitation  ONSET DATE: 2021  SUBJECTIVE:  SUBJECTIVE STATEMENT: Pt reports that her symptoms started when she was doing longer hikes, now symptoms are worse, even just when she is walking. She is trying to eat healthier, more fiber, goes to the bathroom every hour or so to prevent leaking.She reports that she does not feel when she leaks. She gets constipated as well. Recently  is more constipated. When constipated - she  has sometimes had FI. 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 At times made her stool too lose. Walks about 5-6 miles/ day with a group of friends in the mornings at 6:30. She does not eat breakfast, not hungry in the morning. Does not leak or get up at night, when she stands up in the morning, she leaks  Fluid intake: water- mainly water. One coke zero/ day   PAIN:  Are you having pain? No  PRECAUTIONS: None  RED FLAGS: None   WEIGHT BEARING RESTRICTIONS: No  FALLS:  Has patient fallen in last 6 months? No  LIVING ENVIRONMENT: Lives with: lives with their spouse Lives in: House/apartment Stairs: No Has following equipment at home: None  OCCUPATION: retired  PLOF: Independent  PATIENT GOALS: not to pee in her pants, to have a plan, wants to hike cam[place]  PERTINENT HISTORY:  Hysterectomy 37 Sexual abuse: no  BOWEL MOVEMENT: Pain with bowel movement: No Type of bowel  movement:Type (Bristol Stool Scale) 4 with fiber,  Fully empty rectum: No sometimes Leakage: Yes: when 4-8 Pads: Yes:   Fiber supplement: Yes: sometimes  URINATION: Pain with urination: No Fully empty bladder: No- feels like she did, stands up and urinates again Stream: Strong Urgency: Yes:   Frequency: yes Leakage: Urge to void, Walking to the bathroom, Coughing, Sneezing, Laughing, Exercise, Lifting, Bending forward, and Intercourse - SUI and UI Pads: Yes: 3, none at night  INTERCOURSE: Pain with intercourse:  not active  PREGNANCY: Vaginal deliveries 2 Tearing No does not remember C-section deliveries no Currently pregnant No  PROLAPSE: None   OBJECTIVE:  Note: Objective measures were completed at Evaluation unless otherwise noted.   COGNITION: Overall cognitive status: Within functional limits for tasks assessed     SENSATION: Light touch: Appears intact Proprioception: Appears intact  MUSCLE LENGTH: Hamstrings: Right 80 deg; Left 80 deg  LUMBAR SPECIAL TESTS:  Slump test: Negative   POSTURE: rounded shoulders and forward head  PELVIC ALIGNMENT:  LUMBARAROM/PROM: grossly WFL   LOWER EXTREMITY ROM: grossly within functional limitations PROM throughout  LOWER EXTREMITY MMT: grossly within functional limitations throughout  PALPATION:   General  within functional limitations                 External Perineal Exam within functional limitations                              Internal Pelvic Floor tight vaginally, able to contract, overuses abdomen- grips with contraction  Patient confirms identification and approves PT to assess internal pelvic floor and treatment Yes  PELVIC MMT:   MMT eval  Vaginal 4/5  Internal Anal Sphincter   External Anal Sphincter 2/5  Puborectalis   Diastasis Recti no  (Blank rows = not tested)        TONE: High vaginally, low rectally  PROLAPSE: Some anterior vaginal wall laxity noticed- checked in  supine  TODAY'S TREATMENT:  DATE: 12/17/22   EVAL see below   PATIENT EDUCATION:  Education details: constipation management,  Person educated: Patient Education method: Explanation and Handouts Education comprehension: verbalized understanding and needs further education  HOME EXERCISE PROGRAM: [id number]  ASSESSMENT:  CLINICAL IMPRESSION: Patient is a 77 y.o. F who was seen today for physical therapy evaluation and treatment for urinary incontinence without sensory awareness and constipation. She presents with good pelvic floor contraction vaginally, some anterior vaginal wall laxity, poor external and internal anal sphincter strength and decreased coordination. Unable to bear down vaginally and rectally- tends to contract her PF. She seems to not be hydrating enough, does not eat breakfast or enough fiber and is constipated with fecal accidents- leaks when she takes too much fiber. (Bristol stool scale 6-7). Pt will benefit from pelvic floor therapy to improve pelvic floor coordination and strength and reduce urinary and fecal accidents.    OBJECTIVE IMPAIRMENTS: decreased coordination, decreased ROM, and decreased strength.   ACTIVITY LIMITATIONS: continence and toileting  PARTICIPATION LIMITATIONS: community activity  PERSONAL FACTORS: Age and Time since onset of injury/illness/exacerbation are also affecting patient's functional outcome.   REHAB POTENTIAL: Good  CLINICAL DECISION MAKING: Stable/uncomplicated  EVALUATION COMPLEXITY: Low   GOALS: Goals reviewed with patient? Yes  SHORT TERM GOALS: Target date: 01/14/2023     Pt will have Bristol stool scale 3-4 in order to have more complete BMs and less risk of fecal accidents Baseline: Goal status: INITIAL  2.  Pt will be I and consistent with HEP and dem all exercises correctly Baseline:  Goal  status: INITIAL  3.  Pt will report improving frequency of BMs to at least 5/ week Baseline:  Goal status: INITIAL  4.  Pt will increase her water and fiber intake to improve regularity of BM s to reduce FI Baseline:  Goal status: INITIAL   LONG TERM GOALS: Target date: 02/11/2023    Pt will soak 0 pads/ day Baseline:  Goal status: INITIAL  2.  Pt will report 0 fecal accidents Baseline:  Goal status: INITIAL  3.  Pt will urinate every 2-3 hours Baseline:  Goal status: INITIAL  4.  Pt will be I use of squatty potty and perform abdominal massage as needed Baseline:  Goal status: INITIAL    PLAN:  PT FREQUENCY: 1-2x/week  PT DURATION: 8 weeks  PLANNED INTERVENTIONS: 97110-Therapeutic exercises, 97530- Therapeutic activity, 97112- Neuromuscular re-education, 97535- Self Care, 27253- Manual therapy, [phone]- Electrical stimulation (manual), Dry Needling, Joint mobilization, Spinal manipulation, Spinal mobilization, Scar mobilization, and Moist heat  PLAN FOR NEXT SESSION: exercises   Aniesa Boback, PT 12/17/2022, 7:37 PM

## 2022-12-17 ENCOUNTER — Encounter: Payer: Self-pay | Admitting: Physical Therapy

## 2022-12-17 ENCOUNTER — Ambulatory Visit: Payer: PPO | Attending: Obstetrics and Gynecology | Admitting: Physical Therapy

## 2022-12-17 ENCOUNTER — Other Ambulatory Visit: Payer: Self-pay

## 2022-12-17 DIAGNOSIS — R159 Full incontinence of feces: Secondary | ICD-10-CM | POA: Insufficient documentation

## 2022-12-17 DIAGNOSIS — R152 Fecal urgency: Secondary | ICD-10-CM | POA: Insufficient documentation

## 2022-12-17 DIAGNOSIS — N393 Stress incontinence (female) (male): Secondary | ICD-10-CM | POA: Insufficient documentation

## 2022-12-17 DIAGNOSIS — N3281 Overactive bladder: Secondary | ICD-10-CM | POA: Insufficient documentation

## 2022-12-17 DIAGNOSIS — M6281 Muscle weakness (generalized): Secondary | ICD-10-CM

## 2022-12-17 DIAGNOSIS — R278 Other lack of coordination: Secondary | ICD-10-CM

## 2022-12-17 NOTE — Patient Instructions (Signed)
About Constipation  Constipation Overview Constipation is the most common gastrointestinal complaint -- about 4 million Americans experience constipation and make 2.5 million physician visits a year to get help for the problem.  Constipation can occur when the colon absorbs too much water, the colon's muscle contraction is slow or sluggish, and/or there is delayed transit time through the colon.  The result is stool that is hard and dry.  Indicators of constipation include straining during bowel movements greater than 25% of the time, having fewer than three bowel movements per week, and/or the feeling of incomplete evacuation.  There are established guidelines ([place] ) for defining constipation. A person needs to have two or more of the following symptoms for at least 12 weeks (not necessarily consecutive) in the preceding 12 months: Straining in  greater than 25% of bowel movements Lumpy or hard stools in greater than 25% of bowel movements Sensation of incomplete emptying in greater than 25% of bowel movements Sensation of anorectal obstruction/blockade in greater than 25% of bowel movements Manual maneuvers to help empty greater than 25% of bowel movements (e.g., digital evacuation, support of the pelvic floor)  Less than  3 bowel movements/week Loose stools are not present, and criteria for irritable bowel syndrome are insufficient Common Causes of Constipation lack of fiber in your diet lack of physical activity medications, including iron and calcium supplements  dairy intake dehydration abuse of laxatives travel Irritable Bowel Syndrome pregnancy luteal phase of menstruation (after ovulation and before menses) colorectal problems intestinal dysfunction  Treating Constipation  There are several ways of treating constipation, including changes to diet and exercise, use of laxatives, adjustments to the pelvic floor, and scheduled toileting.  These treatments include: increasing  fiber and fluids in the diet  increasing physical activity learning muscle coordination  learning proper toileting techniques and toileting modifications  designing and sticking  to a toileting schedule

## 2022-12-24 ENCOUNTER — Ambulatory Visit: Payer: PPO | Admitting: Physical Therapy

## 2022-12-24 DIAGNOSIS — N393 Stress incontinence (female) (male): Secondary | ICD-10-CM | POA: Diagnosis not present

## 2022-12-24 DIAGNOSIS — R278 Other lack of coordination: Secondary | ICD-10-CM

## 2022-12-24 DIAGNOSIS — M6281 Muscle weakness (generalized): Secondary | ICD-10-CM

## 2022-12-24 NOTE — Therapy (Signed)
OUTPATIENT PHYSICAL THERAPY FEMALE PELVIC EVALUATION   Patient Name: Renee Santos MRN: [id number] DOB:06/06/1945, 77 y.o., female Today's Date: 12/24/2022  END OF SESSION:  PT End of Session - 12/24/22 1628     Visit Number 2    PT Start Time 1530    PT Stop Time 1630    PT Time Calculation (min) 60 min    Activity Tolerance Patient tolerated treatment well    Behavior During Therapy [facility] for tasks assessed/performed              Past Medical History:  Diagnosis Date   Cancer (HCC) 2011   Basal Cell of nose; Dr [place]   Herpes zoster 2008   posterior thorax   Hypertension    Neurofibroma of back 2013    Dr Amy [place]    Osteopenia 2016   T score -1.6 FRAX not calculated because of HRT   Psoriasis    Dr Amy [place]   Thyroid nodule    Past Surgical History:  Procedure Laterality Date   ABDOMINAL HYSTERECTOMY      LSO  & appendectomy for endometrioma   @ age38 for ovarian cyst   APPENDECTOMY     cataract Bilateral Dec 2016;   not sure of date; wears readers only    COLONOSCOPY  multiple   OOPHORECTOMY     LSO for cyst   TONSILLECTOMY     Patient Active Problem List   Diagnosis Date Noted   Kidney lesion, native, right 11/07/2022   Urinary incontinence 09/26/2022   COVID 09/02/2022   Pelvic floor dysfunction 08/07/2022   Stage 3a chronic kidney disease (HCC) 08/07/2022   Screening for heart disease 08/07/2022   Posterior auricular pain of left ear 07/11/2021   Cellulitis of head except face 07/11/2021   Frequent UTI 02/27/2021   Atypical chest pain 10/10/2020   Constipation 10/10/2020   Rash 09/18/2020   Fecal incontinence 08/03/2019   Hyperglycemia 07/16/2016   Dizziness 07/16/2016   Lightheadedness 07/16/2016   Calcific tendonitis of right hand 05/01/2015   Herpes simplex 05/01/2015   Situational anxiety 05/01/2015   Essential hypertension 03/01/2008   Osteopenia 03/01/2008   THYROID NODULE 01/13/2007   Hypothyroidism 01/13/2007    HYPERLIPIDEMIA 01/13/2007   PSORIASIS 01/13/2007    PCP: Pincus Sanes, MD  REFERRING PROVIDER: Selmer Dominion, NP  REFERRING DIAG:  N39.3 (ICD-10-CM) - Stress incontinence of urine  N32.81 (ICD-10-CM) - Overactive bladder  R15.9,R15.2 (ICD-10-CM) - Incontinence of feces with fecal urgency    THERAPY DIAG:  Other lack of coordination  Muscle weakness (generalized)  Rationale for Evaluation and Treatment: Rehabilitation  ONSET DATE: 2021  SUBJECTIVE:  SUBJECTIVE STATEMENT: Pt reports that she drank a lot of water and she was going, control is an issue. Both fecal and urinary, Drinking more water- she was dripping.    Pt reports that her symptoms started when she was doing longer hikes, now symptoms are worse, even just when she is walking. She is trying to eat healthier, more fiber, goes to the bathroom every hour or so to prevent leaking.She reports that she does not feel when she leaks. She gets constipated as well. Recently  is more constipated. When constipated - she  has sometimes had FI. 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 At times made her stool too lose. Walks about 5-6 miles/ day with a group of friends in the mornings at 6:30. She does not eat breakfast, not hungry in the morning. Does not leak or get up at night, when she stands up in the morning, she leaks  Fluid intake: water- mainly water. One coke zero/ day   PAIN:  Are you having pain? No  PRECAUTIONS: None  RED FLAGS: None   WEIGHT BEARING RESTRICTIONS: No  FALLS:  Has patient fallen in last 6 months? No  LIVING ENVIRONMENT: Lives with: lives with their spouse Lives in: House/apartment Stairs: No Has following equipment at home: None  OCCUPATION: retired  PLOF: Independent  PATIENT GOALS: not to pee in her pants, to  have a plan, wants to hike [place]  PERTINENT HISTORY:  Hysterectomy 37 Sexual abuse: no  BOWEL MOVEMENT: Pain with bowel movement: No Type of bowel movement:Type (Bristol Stool Scale) 4 with fiber,  Fully empty rectum: No sometimes Leakage: Yes: when 4-8 Pads: Yes:   Fiber supplement: Yes: sometimes  URINATION: Pain with urination: No Fully empty bladder: No- feels like she did, stands up and urinates again Stream: Strong Urgency: Yes:   Frequency: yes Leakage: Urge to void, Walking to the bathroom, Coughing, Sneezing, Laughing, Exercise, Lifting, Bending forward, and Intercourse - SUI and UI Pads: Yes: 3, none at night  INTERCOURSE: Pain with intercourse:  not active  PREGNANCY: Vaginal deliveries 2 Tearing No does not remember C-section deliveries no Currently pregnant No  PROLAPSE: None   OBJECTIVE:  Note: Objective measures were completed at Evaluation unless otherwise noted.   COGNITION: Overall cognitive status: Within functional limits for tasks assessed     SENSATION: Light touch: Appears intact Proprioception: Appears intact  MUSCLE LENGTH: Hamstrings: Right 80 deg; Left 80 deg  LUMBAR SPECIAL TESTS:  Slump test: Negative   POSTURE: rounded shoulders and forward head  PELVIC ALIGNMENT:  LUMBARAROM/PROM: grossly WFL   LOWER EXTREMITY ROM: grossly within functional limitations PROM throughout  LOWER EXTREMITY MMT: grossly within functional limitations throughout  PALPATION:   General  within functional limitations                 External Perineal Exam within functional limitations                              Internal Pelvic Floor tight vaginally, able to contract, overuses abdomen- grips with contraction  Patient confirms identification and approves PT to assess internal pelvic floor and treatment Yes  PELVIC MMT:   MMT eval  Vaginal 4/5  Internal Anal Sphincter   External Anal Sphincter 2/5  Puborectalis   Diastasis Recti no   (Blank rows = not tested)        TONE: High vaginally, low rectally  PROLAPSE: Some anterior vaginal wall laxity noticed-  checked in supine  TODAY'S TREATMENT:                                                                                                                              DATE: 12/24/22   EVAL see below   PATIENT EDUCATION:  Education details: constipation management,  Person educated: Patient Education method: Explanation and Handouts Education comprehension: verbalized understanding and needs further education  HOME EXERCISE PROGRAM: [id number]  ASSESSMENT:   CLINICAL IMPRESSION: Pt had difficulty with her exercises today. Difficulty coordinating exhale on exertion to engage her pelvic floor. Pt will benefit from cont PT.    Patient is a 78 y.o. F who was seen today for physical therapy evaluation and treatment for urinary incontinence without sensory awareness and constipation. She presents with good pelvic floor contraction vaginally, some anterior vaginal wall laxity, poor external and internal anal sphincter strength and decreased coordination. Unable to bear down vaginally and rectally- tends to contract her PF. She seems to not be hydrating enough, does not eat breakfast or enough fiber and is constipated with fecal accidents- leaks when she takes too much fiber. (Bristol stool scale 6-7). Pt will benefit from pelvic floor therapy to improve pelvic floor coordination and strength and reduce urinary and fecal accidents.    OBJECTIVE IMPAIRMENTS: decreased coordination, decreased ROM, and decreased strength.   ACTIVITY LIMITATIONS: continence and toileting  PARTICIPATION LIMITATIONS: community activity  PERSONAL FACTORS: Age and Time since onset of injury/illness/exacerbation are also affecting patient's functional outcome.   REHAB POTENTIAL: Good  CLINICAL DECISION MAKING: Stable/uncomplicated  EVALUATION COMPLEXITY: Low   GOALS: Goals reviewed with  patient? Yes  SHORT TERM GOALS: Target date: 01/14/2023     Pt will have Bristol stool scale 3-4 in order to have more complete BMs and less risk of fecal accidents Baseline: Goal status: INITIAL  2.  Pt will be I and consistent with HEP and dem all exercises correctly Baseline:  Goal status: INITIAL  3.  Pt will report improving frequency of BMs to at least 5/ week Baseline:  Goal status: INITIAL  4.  Pt will increase her water and fiber intake to improve regularity of BM s to reduce FI Baseline:  Goal status: INITIAL   LONG TERM GOALS: Target date: 02/11/2023    Pt will soak 0 pads/ day Baseline:  Goal status: INITIAL  2.  Pt will report 0 fecal accidents Baseline:  Goal status: INITIAL  3.  Pt will urinate every 2-3 hours Baseline:  Goal status: INITIAL  4.  Pt will be I use of squatty potty and perform abdominal massage as needed Baseline:  Goal status: INITIAL    PLAN:  PT FREQUENCY: 1-2x/week  PT DURATION: 8 weeks  PLANNED INTERVENTIONS: 97110-Therapeutic exercises, 97530- Therapeutic activity, [id number]- Neuromuscular re-education, 97535- Self Care, 16109- Manual therapy, [id number]- Electrical stimulation (manual), Dry Needling, Joint mobilization, Spinal manipulation, Spinal mobilization, Scar mobilization, and Moist heat  PLAN FOR NEXT SESSION: exercises  Rebel Willcutt, PT 12/24/2022, 4:30 PM

## 2022-12-31 ENCOUNTER — Ambulatory Visit: Payer: PPO | Admitting: Physical Therapy

## 2022-12-31 DIAGNOSIS — R278 Other lack of coordination: Secondary | ICD-10-CM

## 2022-12-31 DIAGNOSIS — M6281 Muscle weakness (generalized): Secondary | ICD-10-CM

## 2022-12-31 DIAGNOSIS — N393 Stress incontinence (female) (male): Secondary | ICD-10-CM | POA: Diagnosis not present

## 2022-12-31 NOTE — Therapy (Addendum)
OUTPATIENT PHYSICAL THERAPY FEMALE PELVIC EVALUATION   Patient Name: Renee Santos MRN: [id number] DOB:[birth year], 77 y.o., female Today's Date: 12/31/2022  END OF SESSION:  PT End of Session - 12/31/22 1521     Visit Number 3    PT Start Time 1445    PT Stop Time 1530    PT Time Calculation (min) 45 min    Activity Tolerance Patient tolerated treatment well    Behavior During Therapy [facility] for tasks assessed/performed               Past Medical History:  Diagnosis Date   Cancer (HCC) 2011   Basal Cell of nose; Dr [place]   Herpes zoster 2008   posterior thorax   Hypertension    Neurofibroma of back 2013    Dr Amy [place]    Osteopenia 2016   T score -1.6 FRAX not calculated because of HRT   Psoriasis    Dr Amy [place]   Thyroid nodule    Past Surgical History:  Procedure Laterality Date   ABDOMINAL HYSTERECTOMY      LSO  & appendectomy for endometrioma   @ age38 for ovarian cyst   APPENDECTOMY     cataract Bilateral Dec 2016;   not sure of date; wears readers only    COLONOSCOPY  multiple   OOPHORECTOMY     LSO for cyst   TONSILLECTOMY     Patient Active Problem List   Diagnosis Date Noted   Kidney lesion, native, right 11/07/2022   Urinary incontinence 09/26/2022   COVID 09/02/2022   Pelvic floor dysfunction 08/07/2022   Stage 3a chronic kidney disease (HCC) 08/07/2022   Screening for heart disease 08/07/2022   Posterior auricular pain of left ear 07/11/2021   Cellulitis of head except face 07/11/2021   Frequent UTI 02/27/2021   Atypical chest pain 10/10/2020   Constipation 10/10/2020   Rash 09/18/2020   Fecal incontinence 08/03/2019   Hyperglycemia 07/16/2016   Dizziness 07/16/2016   Lightheadedness 07/16/2016   Calcific tendonitis of right hand 05/01/2015   Herpes simplex 05/01/2015   Situational anxiety 05/01/2015   Essential hypertension 03/01/2008   Osteopenia 03/01/2008   THYROID NODULE 01/13/2007   Hypothyroidism 01/13/2007    HYPERLIPIDEMIA 01/13/2007   PSORIASIS 01/13/2007    PCP: Pincus Sanes, MD  REFERRING PROVIDER: Selmer Dominion, NP  REFERRING DIAG:  N39.3 (ICD-10-CM) - Stress incontinence of urine  N32.81 (ICD-10-CM) - Overactive bladder  R15.9,R15.2 (ICD-10-CM) - Incontinence of feces with fecal urgency    THERAPY DIAG:  Other lack of coordination  Muscle weakness (generalized)  Rationale for Evaluation and Treatment: Rehabilitation  ONSET DATE: 2021  SUBJECTIVE:  SUBJECTIVE STATEMENT: Pt reports that she has done her exercises mostly when she is driving, she does not sit down very much, she is busy. She has tried to do her exercises 2x in the last 2 days more.       Pt reports that she drank a lot of water and she was going, control is an issue. Both fecal and urinary, Drinking more water- she was dripping.    Pt reports that her symptoms started when she was doing longer hikes, now symptoms are worse, even just when she is walking. She is trying to eat healthier, more fiber, goes to the bathroom every hour or so to prevent leaking.She reports that she does not feel when she leaks. She gets constipated as well. Recently  is more constipated. When constipated - she  has sometimes had FI. 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 At times made her stool too lose. Walks about 5-6 miles/ day with a group of friends in the mornings at 6:30. She does not eat breakfast, not hungry in the morning. Does not leak or get up at night, when she stands up in the morning, she leaks  Fluid intake: water- mainly water. One coke zero/ day   PAIN:  Are you having pain? No  PRECAUTIONS: None  RED FLAGS: None   WEIGHT BEARING RESTRICTIONS: No  FALLS:  Has patient fallen in last 6 months? No  LIVING ENVIRONMENT: Lives with:  lives with their spouse Lives in: House/apartment Stairs: No Has following equipment at home: None  OCCUPATION: retired  PLOF: Independent  PATIENT GOALS: not to pee in her pants, to have a plan, wants to hike camino  PERTINENT HISTORY:  Hysterectomy 37 Sexual abuse: no  BOWEL MOVEMENT: Pain with bowel movement: No Type of bowel movement:Type (Bristol Stool Scale) 4 with fiber,  Fully empty rectum: No sometimes Leakage: Yes: when 4-8 Pads: Yes:   Fiber supplement: Yes: sometimes  URINATION: Pain with urination: No Fully empty bladder: No- feels like she did, stands up and urinates again Stream: Strong Urgency: Yes:   Frequency: yes Leakage: Urge to void, Walking to the bathroom, Coughing, Sneezing, Laughing, Exercise, Lifting, Bending forward, and Intercourse - SUI and UI Pads: Yes: 3, none at night  INTERCOURSE: Pain with intercourse:  not active  PREGNANCY: Vaginal deliveries 2 Tearing No does not remember C-section deliveries no Currently pregnant No  PROLAPSE: None   OBJECTIVE:  Note: Objective measures were completed at Evaluation unless otherwise noted.   COGNITION: Overall cognitive status: Within functional limits for tasks assessed     SENSATION: Light touch: Appears intact Proprioception: Appears intact  MUSCLE LENGTH: Hamstrings: Right 80 deg; Left 80 deg  LUMBAR SPECIAL TESTS:  Slump test: Negative   POSTURE: rounded shoulders and forward head  PELVIC ALIGNMENT:  LUMBARAROM/PROM: grossly WFL   LOWER EXTREMITY ROM: grossly within functional limitations PROM throughout  LOWER EXTREMITY MMT: grossly within functional limitations throughout  PALPATION:   General  within functional limitations                 External Perineal Exam within functional limitations                              Internal Pelvic Floor tight vaginally, able to contract, overuses abdomen- grips with contraction  Patient confirms identification and  approves PT to assess internal pelvic floor and treatment Yes  PELVIC MMT:   MMT eval  Vaginal 4/5  Internal Anal Sphincter   External Anal Sphincter 2/5  Puborectalis   Diastasis Recti no  (Blank rows = not tested)        TONE: High vaginally, low rectally  PROLAPSE: Some anterior vaginal wall laxity noticed- checked in supine  TODAY'S TREATMENT:                                                                                                                              DATE: 12/31/22   EVAL see below    Neuro reed- hip adduction with ball Seated on yoga ball PF contraction  Supine ball press with transverse abdominis breath and pelvic floor contraction Diaphragmatic breathing with thera band around lower ribs Cocontraction in quadruped      PATIENT EDUCATION:  Education details: constipation management,  Person educated: Patient Education method: Explanation and Handouts Education comprehension: verbalized understanding and needs further education  HOME EXERCISE PROGRAM: [id number]  ASSESSMENT:   CLINICAL IMPRESSION: Pt doing so much better today with her exercises. Progressing well Discussed importance of consistency with her HEP.    Pt had difficulty with her exercises today. Difficulty coordinating exhale on exertion to engage her pelvic floor. Pt will benefit from cont PT.    Patient is a 77 y.o. F who was seen today for physical therapy evaluation and treatment for urinary incontinence without sensory awareness and constipation. She presents with good pelvic floor contraction vaginally, some anterior vaginal wall laxity, poor external and internal anal sphincter strength and decreased coordination. Unable to bear down vaginally and rectally- tends to contract her PF. She seems to not be hydrating enough, does not eat breakfast or enough fiber and is constipated with fecal accidents- leaks when she takes too much fiber. (Bristol stool scale 6-7). Pt will  benefit from pelvic floor therapy to improve pelvic floor coordination and strength and reduce urinary and fecal accidents.    OBJECTIVE IMPAIRMENTS: decreased coordination, decreased ROM, and decreased strength.   ACTIVITY LIMITATIONS: continence and toileting  PARTICIPATION LIMITATIONS: community activity  PERSONAL FACTORS: Age and Time since onset of injury/illness/exacerbation are also affecting patient's functional outcome.   REHAB POTENTIAL: Good  CLINICAL DECISION MAKING: Stable/uncomplicated  EVALUATION COMPLEXITY: Low   GOALS: Goals reviewed with patient? Yes  SHORT TERM GOALS: Target date: 01/14/2023     Pt will have Bristol stool scale 3-4 in order to have more complete BMs and less risk of fecal accidents Baseline: Goal status: INITIAL  2.  Pt will be I and consistent with HEP and dem all exercises correctly Baseline:  Goal status: INITIAL  3.  Pt will report improving frequency of BMs to at least 5/ week Baseline:  Goal status: INITIAL  4.  Pt will increase her water and fiber intake to improve regularity of BM s to reduce FI Baseline:  Goal status: INITIAL   LONG TERM GOALS: Target date: 02/11/2023    Pt will soak 0 pads/ day Baseline:  Goal status:  INITIAL  2.  Pt will report 0 fecal accidents Baseline:  Goal status: INITIAL  3.  Pt will urinate every 2-3 hours Baseline:  Goal status: INITIAL  4.  Pt will be I use of squatty potty and perform abdominal massage as needed Baseline:  Goal status: INITIAL    PLAN:  PT FREQUENCY: 1-2x/week  PT DURATION: 8 weeks  PLANNED INTERVENTIONS: 97110-Therapeutic exercises, 97530- Therapeutic activity, 97112- Neuromuscular re-education, 97535- Self Care, 29562- Manual therapy, [phone]- Electrical stimulation (manual), Dry Needling, Joint mobilization, Spinal manipulation, Spinal mobilization, Scar mobilization, and Moist heat  PLAN FOR NEXT SESSION: exercises   Lew Prout, PT 12/31/22 3:22 PM

## 2023-01-06 DIAGNOSIS — M542 Cervicalgia: Secondary | ICD-10-CM | POA: Diagnosis not present

## 2023-01-14 ENCOUNTER — Encounter: Payer: PPO | Admitting: Physical Therapy

## 2023-01-15 DIAGNOSIS — R7989 Other specified abnormal findings of blood chemistry: Secondary | ICD-10-CM | POA: Diagnosis not present

## 2023-01-15 DIAGNOSIS — N1831 Chronic kidney disease, stage 3a: Secondary | ICD-10-CM | POA: Diagnosis not present

## 2023-01-16 ENCOUNTER — Ambulatory Visit: Payer: PPO | Attending: Obstetrics and Gynecology | Admitting: Physical Therapy

## 2023-01-16 ENCOUNTER — Encounter: Payer: Self-pay | Admitting: Physical Therapy

## 2023-01-16 DIAGNOSIS — M6281 Muscle weakness (generalized): Secondary | ICD-10-CM | POA: Diagnosis not present

## 2023-01-16 DIAGNOSIS — M25572 Pain in left ankle and joints of left foot: Secondary | ICD-10-CM | POA: Diagnosis not present

## 2023-01-16 DIAGNOSIS — R278 Other lack of coordination: Secondary | ICD-10-CM

## 2023-01-16 NOTE — Therapy (Signed)
OUTPATIENT PHYSICAL THERAPY FEMALE PELVIC TREATMENT   Patient Name: Renee Santos MRN: [id number] DOB:August 10, 1945, 77 y.o., female Today's Date: 01/16/2023  END OF SESSION:  PT End of Session - 01/16/23 1459     Visit Number 4    PT Start Time 1500    PT Stop Time 1530    PT Time Calculation (min) 30 min    Activity Tolerance Patient tolerated treatment well    Behavior During Therapy [facility] for tasks assessed/performed               Past Medical History:  Diagnosis Date   Cancer (HCC) 2011   Basal Cell of nose; Dr [place]   Herpes zoster 2008   posterior thorax   Hypertension    Neurofibroma of back 2013    Dr Amy [place]    Osteopenia 2016   T score -1.6 FRAX not calculated because of HRT   Psoriasis    Dr Amy [place]   Thyroid nodule    Past Surgical History:  Procedure Laterality Date   ABDOMINAL HYSTERECTOMY      LSO  & appendectomy for endometrioma   @ age38 for ovarian cyst   APPENDECTOMY     cataract Bilateral Dec 2016;   not sure of date; wears readers only    COLONOSCOPY  multiple   OOPHORECTOMY     LSO for cyst   TONSILLECTOMY     Patient Active Problem List   Diagnosis Date Noted   Kidney lesion, native, right 11/07/2022   Urinary incontinence 09/26/2022   COVID 09/02/2022   Pelvic floor dysfunction 08/07/2022   Stage 3a chronic kidney disease (HCC) 08/07/2022   Screening for heart disease 08/07/2022   Posterior auricular pain of left ear 07/11/2021   Cellulitis of head except face 07/11/2021   Frequent UTI 02/27/2021   Atypical chest pain 10/10/2020   Constipation 10/10/2020   Rash 09/18/2020   Fecal incontinence 08/03/2019   Hyperglycemia 07/16/2016   Dizziness 07/16/2016   Lightheadedness 07/16/2016   Calcific tendonitis of right hand 05/01/2015   Herpes simplex 05/01/2015   Situational anxiety 05/01/2015   Essential hypertension 03/01/2008   Osteopenia 03/01/2008   THYROID NODULE 01/13/2007   Hypothyroidism 01/13/2007    HYPERLIPIDEMIA 01/13/2007   PSORIASIS 01/13/2007    PCP: Pincus Sanes, MD  REFERRING PROVIDER: Selmer Dominion, NP  REFERRING DIAG:  N39.3 (ICD-10-CM) - Stress incontinence of urine  N32.81 (ICD-10-CM) - Overactive bladder  R15.9,R15.2 (ICD-10-CM) - Incontinence of feces with fecal urgency    THERAPY DIAG:  Other lack of coordination  Muscle weakness (generalized)  Rationale for Evaluation and Treatment: Rehabilitation  ONSET DATE: 2021  SUBJECTIVE:  SUBJECTIVE STATEMENT: Pt reports that she has been inconsistent with her exercises. She has fecal incontinence  when she walks- type 1 sometimes, type 6, sometimes 4. She walks 5-6 miles and there are not bathrooms on the route Can usually make it to the bathroom when at home Drinks more water Pt reports that she gets up in the morning and walks 5-6 miles, does not eat breakfast. Eats when she returns from her walk Knows that making this a priority would be good. However she has been too busy with move, ankle exercises and neck exercises and has not done her exercises.   PAIN:  Are you having pain? No  PRECAUTIONS: None  RED FLAGS: None   WEIGHT BEARING RESTRICTIONS: No  FALLS:  Has patient fallen in last 6 months? No  LIVING ENVIRONMENT: Lives with: lives with their spouse Lives in: House/apartment Stairs: No Has following equipment at home: None  OCCUPATION: retired  PLOF: Independent  PATIENT GOALS: not to pee in her pants, to have a plan, wants to hike camino  PERTINENT HISTORY:  Hysterectomy 37 Sexual abuse: no  BOWEL MOVEMENT: Pain with bowel movement: No Type of bowel movement:Type (Bristol Stool Scale) 4 with fiber,  Fully empty rectum: No sometimes Leakage: Yes: when 4-8 Pads: Yes:   Fiber supplement: Yes:  sometimes  URINATION: Pain with urination: No Fully empty bladder: No- feels like she did, stands up and urinates again Stream: Strong Urgency: Yes:   Frequency: yes Leakage: Urge to void, Walking to the bathroom, Coughing, Sneezing, Laughing, Exercise, Lifting, Bending forward, and Intercourse - SUI and UI Pads: Yes: 3, none at night  INTERCOURSE: Pain with intercourse:  not active  PREGNANCY: Vaginal deliveries 2 Tearing No does not remember C-section deliveries no Currently pregnant No  PROLAPSE: None   OBJECTIVE:  Note: Objective measures were completed at Evaluation unless otherwise noted.   COGNITION: Overall cognitive status: Within functional limits for tasks assessed     SENSATION: Light touch: Appears intact Proprioception: Appears intact  MUSCLE LENGTH: Hamstrings: Right 80 deg; Left 80 deg  LUMBAR SPECIAL TESTS:  Slump test: Negative   POSTURE: rounded shoulders and forward head  PELVIC ALIGNMENT:  LUMBARAROM/PROM: grossly WFL   LOWER EXTREMITY ROM: grossly within functional limitations PROM throughout  LOWER EXTREMITY MMT: grossly within functional limitations throughout  PALPATION:   General  within functional limitations                 External Perineal Exam within functional limitations                              Internal Pelvic Floor tight vaginally, able to contract, overuses abdomen- grips with contraction  Patient confirms identification and approves PT to assess internal pelvic floor and treatment Yes  PELVIC MMT:   MMT eval  Vaginal 4/5  Internal Anal Sphincter   External Anal Sphincter 2/5  Puborectalis   Diastasis Recti no  (Blank rows = not tested)        TONE: High vaginally, low rectally  PROLAPSE: Some anterior vaginal wall laxity noticed- checked in hooklying  TODAY'S TREATMENT:  DATE: 01/16/2023   Neuroreed- sit to stand with transverse abdominis breath        Horizontal abduction with theraband with transverse        abdominis breath  There acts-  HEP review, review of constipation management strategies      PATIENT EDUCATION:  Education details: constipation management,  Person educated: Patient Education method: Explanation and Handouts Education comprehension: verbalized understanding and needs further education  HOME EXERCISE PROGRAM: [id number]  ASSESSMENT:  CLINICAL IMPRESSION:  Pt has been inconsistent with her constipation strategies, with her HEP d/t  not having time. 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 Discussed coming to PT in February when she will have more time.  We agreed on discharge today.   OBJECTIVE IMPAIRMENTS: decreased coordination, decreased ROM, and decreased strength.   ACTIVITY LIMITATIONS: continence and toileting  PARTICIPATION LIMITATIONS: community activity  PERSONAL FACTORS: Age and Time since onset of injury/illness/exacerbation are also affecting patient's functional outcome.   REHAB POTENTIAL: Good  CLINICAL DECISION MAKING: Stable/uncomplicated  EVALUATION COMPLEXITY: Low   GOALS: Goals reviewed with patient? Yes  SHORT TERM GOALS: Target date: 01/14/2023     Pt will have Bristol stool scale 3-4 in order to have more complete BMs and less risk of fecal accidents Baseline:1-7 Goal status: dicharged  2.  Pt will be I and consistent with HEP and dem all exercises correctly Baseline: no Goal status: discharged  3.  Pt will report improving frequency of BMs to at least 5/ week Baseline: 2/week Goal status: discharged  4.  Pt will increase her water and fiber intake to improve regularity of BM s to reduce FI Baseline: no Goal status: discharged  LONG TERM GOALS: Target date: 02/11/2023    Pt will soak 0 pads/ day Baseline:  Goal  status: INITIAL  2.  Pt will report 0 fecal accidents Baseline:  Goal status: INITIAL  3.  Pt will urinate every 2-3 hours Baseline:  Goal status: INITIAL  4.  Pt will be I use of squatty potty and perform abdominal massage as needed Baseline:  Goal status: INITIAL    PLAN:  PT FREQUENCY: 1-2x/week  PT DURATION: 8 weeks  PLANNED INTERVENTIONS: 97110-Therapeutic exercises, 97530- Therapeutic activity, 97112- Neuromuscular re-education, 97535- Self Care, 29562- Manual therapy, [phone]- Electrical stimulation (manual), Dry Needling, Joint mobilization, Spinal manipulation, Spinal mobilization, Scar mobilization, and Moist heat  PLAN FOR NEXT SESSION: exercises   Payden Bonus, PT 01/16/23 3:28 PM   PHYSICAL THERAPY DISCHARGE SUMMARY  Visits from Start of Care: 4  Current functional level related to goals / functional outcomes: Goals not met   Remaining deficits: Constipation, stress urinary incontinence and fecal incontinence present   Education / Equipment: HEP, relevant anatomy, constipation management strategies   Patient agrees to discharge. Patient goals were not met. Patient is being discharged due to the patient's request.

## 2023-01-17 ENCOUNTER — Encounter: Payer: Self-pay | Admitting: Internal Medicine

## 2023-01-22 DIAGNOSIS — Z961 Presence of intraocular lens: Secondary | ICD-10-CM | POA: Diagnosis not present

## 2023-01-22 DIAGNOSIS — H26491 Other secondary cataract, right eye: Secondary | ICD-10-CM | POA: Diagnosis not present

## 2023-01-22 DIAGNOSIS — H04123 Dry eye syndrome of bilateral lacrimal glands: Secondary | ICD-10-CM | POA: Diagnosis not present

## 2023-01-22 DIAGNOSIS — H43813 Vitreous degeneration, bilateral: Secondary | ICD-10-CM | POA: Diagnosis not present

## 2023-01-27 DIAGNOSIS — M542 Cervicalgia: Secondary | ICD-10-CM | POA: Diagnosis not present

## 2023-02-19 DIAGNOSIS — M542 Cervicalgia: Secondary | ICD-10-CM | POA: Diagnosis not present

## 2023-03-12 DIAGNOSIS — M542 Cervicalgia: Secondary | ICD-10-CM | POA: Diagnosis not present

## 2023-03-19 DIAGNOSIS — Z1231 Encounter for screening mammogram for malignant neoplasm of breast: Secondary | ICD-10-CM | POA: Diagnosis not present

## 2023-03-19 LAB — HM MAMMOGRAPHY

## 2023-03-20 ENCOUNTER — Encounter: Payer: Self-pay | Admitting: Internal Medicine

## 2023-04-15 DIAGNOSIS — D2272 Melanocytic nevi of left lower limb, including hip: Secondary | ICD-10-CM | POA: Diagnosis not present

## 2023-04-15 DIAGNOSIS — D225 Melanocytic nevi of trunk: Secondary | ICD-10-CM | POA: Diagnosis not present

## 2023-04-15 DIAGNOSIS — L400 Psoriasis vulgaris: Secondary | ICD-10-CM | POA: Diagnosis not present

## 2023-04-15 DIAGNOSIS — D1801 Hemangioma of skin and subcutaneous tissue: Secondary | ICD-10-CM | POA: Diagnosis not present

## 2023-04-15 DIAGNOSIS — D2271 Melanocytic nevi of right lower limb, including hip: Secondary | ICD-10-CM | POA: Diagnosis not present

## 2023-04-16 DIAGNOSIS — M542 Cervicalgia: Secondary | ICD-10-CM | POA: Diagnosis not present

## 2023-05-07 DIAGNOSIS — M542 Cervicalgia: Secondary | ICD-10-CM | POA: Diagnosis not present

## 2023-05-13 IMAGING — DX DG CHEST 2V
2 series · 2 of 2 positions shown · non-contrast
Comparison: 03/10/2012.

CLINICAL DATA: Chest pain.

EXAM:
CHEST - 2 VIEW

[chest pa]
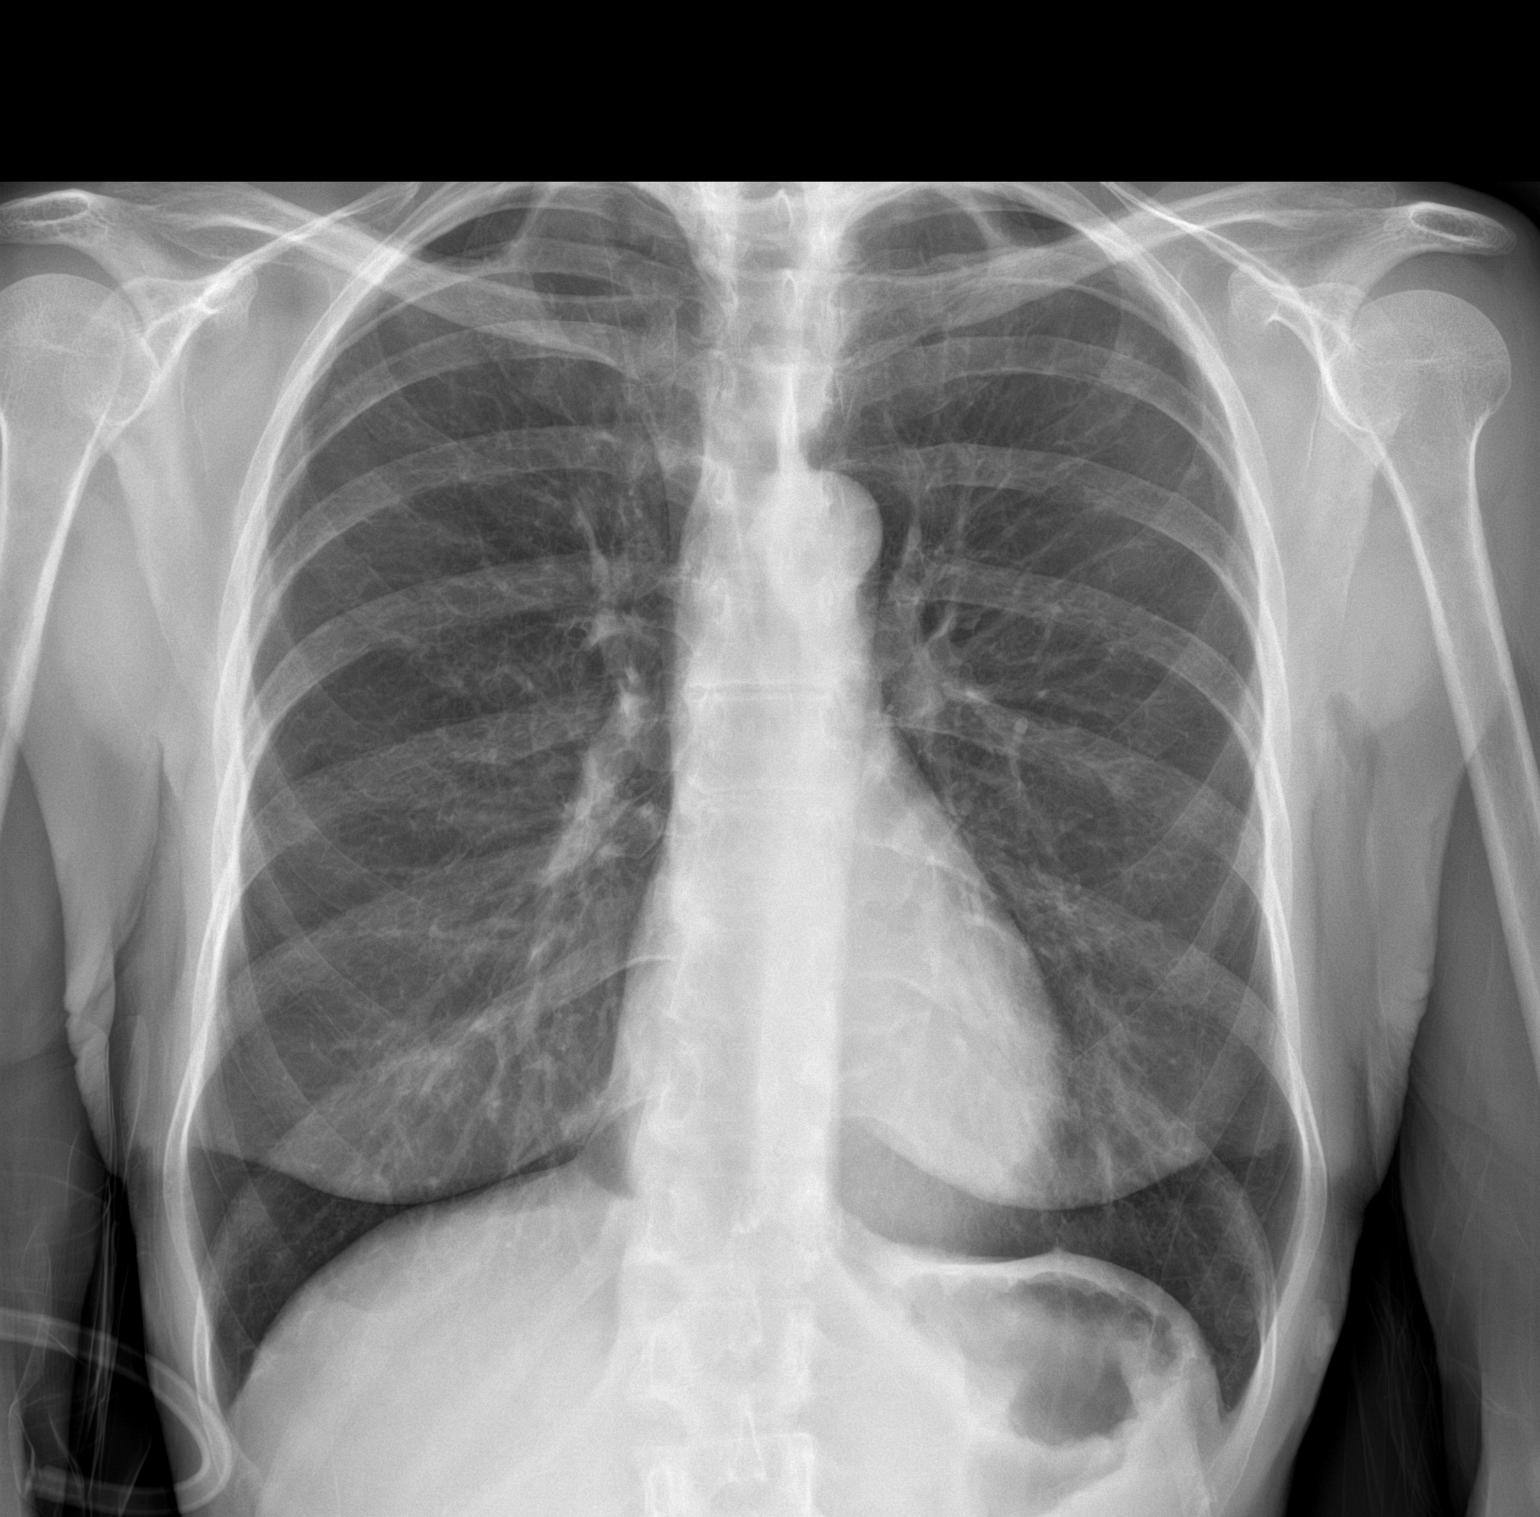

[chest lat]
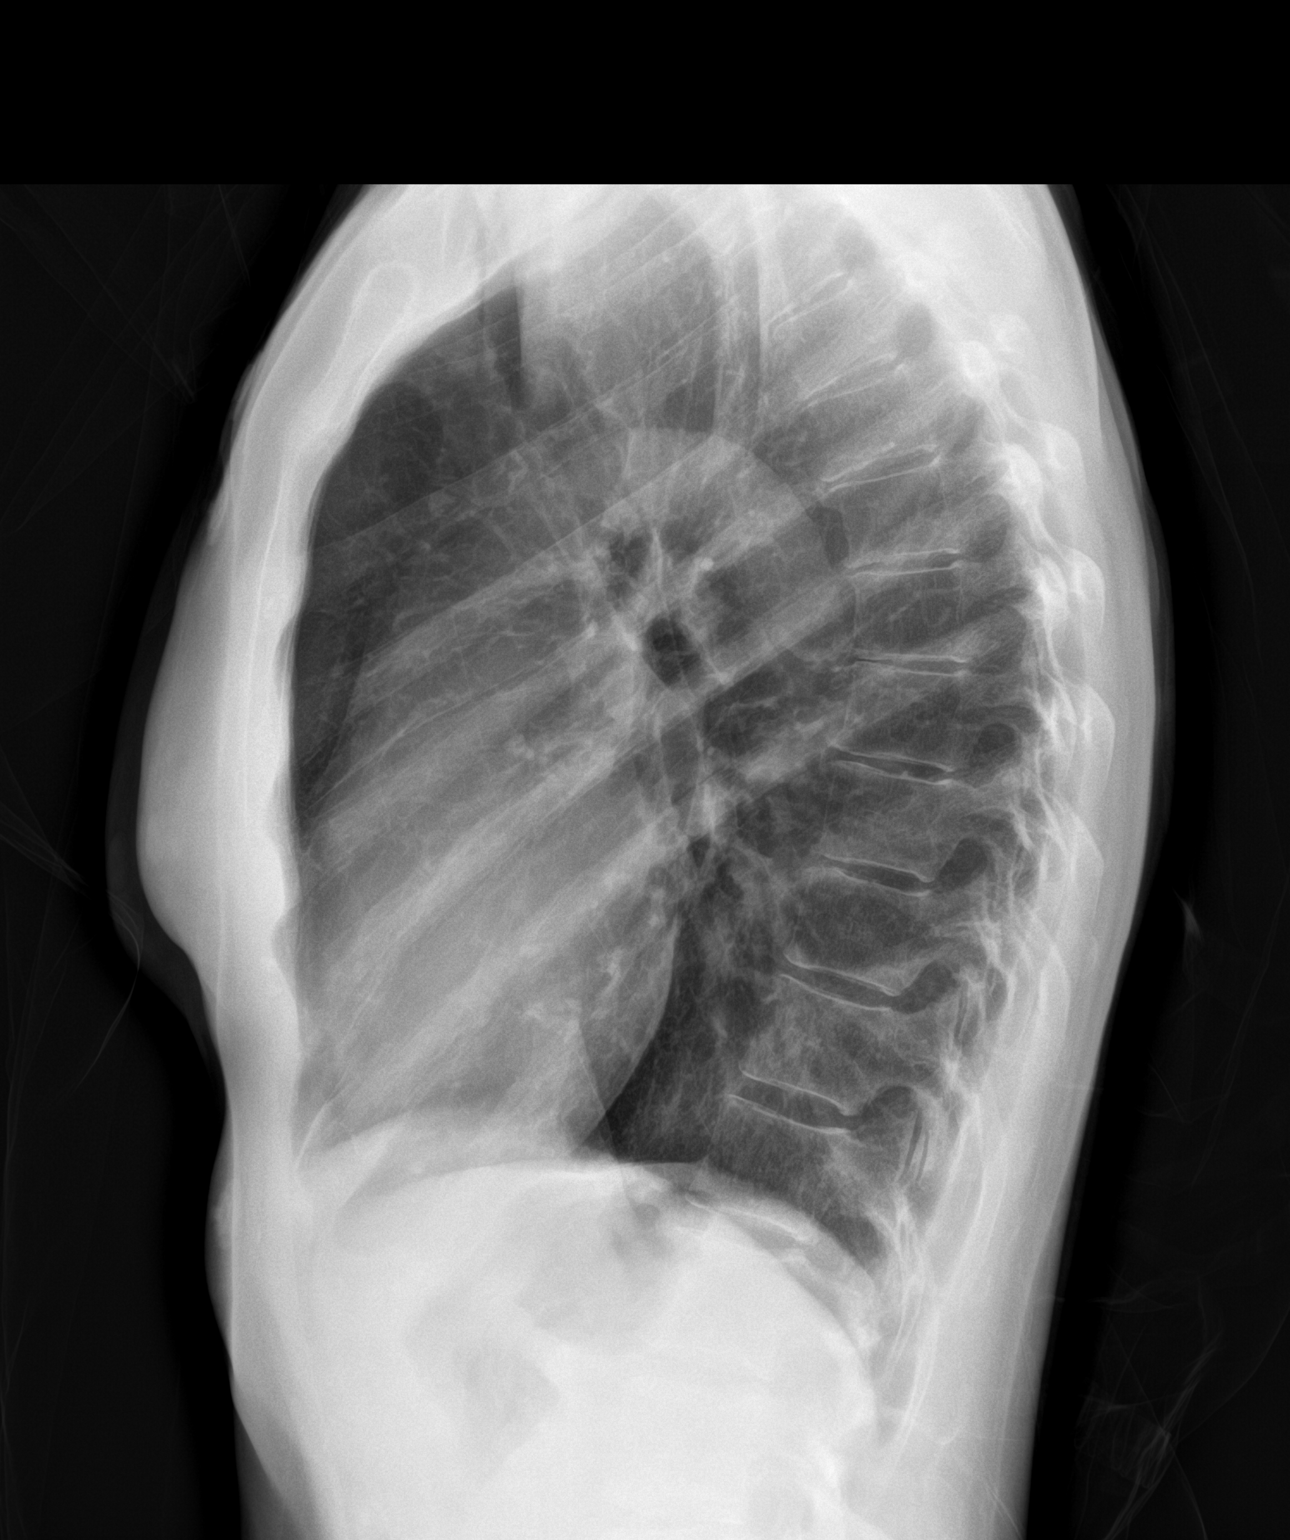

[2 of 2 positions shown; findings below may reference images not displayed]

FINDINGS: Mediastinum and hilar structures normal. COPD. Stable calcified
nodule left upper lung consistent with calcified granuloma. No focal
infiltrate. No pleural effusion or pneumothorax.
IMPRESSION: No acute cardiopulmonary disease.

## 2023-05-26 DIAGNOSIS — M542 Cervicalgia: Secondary | ICD-10-CM | POA: Diagnosis not present

## 2023-06-02 DIAGNOSIS — H6121 Impacted cerumen, right ear: Secondary | ICD-10-CM | POA: Diagnosis not present

## 2023-06-03 DIAGNOSIS — H6121 Impacted cerumen, right ear: Secondary | ICD-10-CM | POA: Diagnosis not present

## 2023-06-11 DIAGNOSIS — M542 Cervicalgia: Secondary | ICD-10-CM | POA: Diagnosis not present

## 2023-06-22 ENCOUNTER — Encounter: Payer: Self-pay | Admitting: Internal Medicine

## 2023-06-22 NOTE — Progress Notes (Unsigned)
    Subjective:    Patient ID: Renee Santos, female    DOB: 1945-07-22, 78 y.o.   MRN: [id number]      HPI Renee Santos is here for No chief complaint on file.    Memory concerns -     Medications and allergies reviewed with patient and updated if appropriate.  Current Outpatient Medications on File Prior to Visit  Medication Sig Dispense Refill   BIOTIN FORTE PO Take 1 tablet by mouth.     Calcium Carbonate-Vit D-Min (CALTRATE PLUS PO) Take by mouth daily.       clobetasol  cream (TEMOVATE ) 0.05 % Apply 1 application topically 2 (two) times daily. (Patient taking differently: Apply 1 application  topically 2 (two) times daily as needed.) 45 g 1   hydrochlorothiazide  (HYDRODIURIL ) 25 MG tablet Take 1/2 tablet by mouth daily 45 tablet 3   levothyroxine  (SYNTHROID ) 75 MCG tablet Take 1 tablet by mouth 6 days a week 77 tablet 3   LORazepam  (ATIVAN ) 0.5 MG tablet Take 1 tablet (0.5 mg total) by mouth 2 (two) times daily as needed. for anxiety 30 tablet 2   metoprolol  succinate (TOPROL -XL) 50 MG 24 hr tablet Take 1/2 tablet by mouth every day 45 tablet 3   multivitamin-lutein (OCUVITE-LUTEIN) CAPS capsule Take 1 capsule by mouth daily.     valACYclovir (VALTREX) 1000 MG tablet Take 1,000 mg by mouth daily.     No current facility-administered medications on file prior to visit.    Review of Systems     Objective:  There were no vitals filed for this visit. BP Readings from Last 3 Encounters:  11/05/22 119/76  09/26/22 (!) 146/90  09/12/22 134/65   Wt Readings from Last 3 Encounters:  11/05/22 101 lb (45.8 kg)  10/07/22 102 lb (46.3 kg)  09/26/22 101 lb (45.8 kg)   There is no height or weight on file to calculate BMI.    Physical Exam         Assessment & Plan:    See Problem List for Assessment and Plan of chronic medical problems.

## 2023-06-22 NOTE — Patient Instructions (Incomplete)
      Blood work was ordered.       Medications changes include :   trazodone 25-50 mg at night for sleep as needed    A referral was ordered for neurology and neuropsychology and someone will call you to schedule an appointment.    Call and follow up  - [facility] at [facility]  [phone]

## 2023-06-23 ENCOUNTER — Ambulatory Visit (INDEPENDENT_AMBULATORY_CARE_PROVIDER_SITE_OTHER): Admitting: Internal Medicine

## 2023-06-23 ENCOUNTER — Encounter: Payer: Self-pay | Admitting: Internal Medicine

## 2023-06-23 VITALS — BP 128/80 | HR 60 | Temp 97.7°F | Ht 61.5 in | Wt 101.0 lb

## 2023-06-23 DIAGNOSIS — R413 Other amnesia: Secondary | ICD-10-CM | POA: Diagnosis not present

## 2023-06-23 DIAGNOSIS — G479 Sleep disorder, unspecified: Secondary | ICD-10-CM | POA: Insufficient documentation

## 2023-06-23 DIAGNOSIS — E538 Deficiency of other specified B group vitamins: Secondary | ICD-10-CM | POA: Diagnosis not present

## 2023-06-23 DIAGNOSIS — G3184 Mild cognitive impairment, so stated: Secondary | ICD-10-CM | POA: Insufficient documentation

## 2023-06-23 LAB — TSH: TSH: 1.85 u[IU]/mL (ref 0.35–5.50)

## 2023-06-23 LAB — VITAMIN B12: Vitamin B-12: 257 pg/mL (ref 211–911)

## 2023-06-23 MED ORDER — TRAZODONE HCL 50 MG PO TABS: 50.0000 mg | ORAL_TABLET | Freq: Every day | ORAL | 5 refills | Status: DC

## 2023-06-23 NOTE — Assessment & Plan Note (Addendum)
 New For the past few months her husband noticed some memory difficulties She cannot remember names which she knows is normal She did go to see a play and really cannot remember anything about it She tends to have more difficulty organizing things in her mind She is high functioning-very busy, exercising regularly, volunteering and fully independent in her ADLs ?  Memory issue versus other cause of perceived memory issues/normal memory loss Check B12 level, TSH-both have been normal in the past Referral to neurology  B12 level on the low side-advised supplementation TSH normal

## 2023-06-23 NOTE — Assessment & Plan Note (Signed)
 Chronic Has been able to fall asleep, but wakes up and overall is not getting great sleep This could in fact be affecting her memory as well Has been taking Benadryl as needed oral lorazepam -ideally want to avoid these Trial of trazodone 25-50 mg nightly as needed-discussed she does not need to take this on a nightly basis if she does not need them-can adjust if needed

## 2023-06-23 NOTE — Assessment & Plan Note (Signed)
 New Blood work from today shows that B12 level is on the low side Advised starting B12 1000 mcg daily

## 2023-06-24 ENCOUNTER — Encounter: Payer: Self-pay | Admitting: Physician Assistant

## 2023-06-25 DIAGNOSIS — M542 Cervicalgia: Secondary | ICD-10-CM | POA: Diagnosis not present

## 2023-06-27 ENCOUNTER — Encounter: Payer: Self-pay | Admitting: Nurse Practitioner

## 2023-06-27 ENCOUNTER — Ambulatory Visit: Admitting: Nurse Practitioner

## 2023-06-27 VITALS — BP 112/76 | HR 75 | Ht 61.25 in | Wt 104.0 lb

## 2023-06-27 DIAGNOSIS — Z01419 Encounter for gynecological examination (general) (routine) without abnormal findings: Secondary | ICD-10-CM

## 2023-06-27 DIAGNOSIS — Z780 Asymptomatic menopausal state: Secondary | ICD-10-CM | POA: Diagnosis not present

## 2023-06-27 DIAGNOSIS — M85851 Other specified disorders of bone density and structure, right thigh: Secondary | ICD-10-CM | POA: Diagnosis not present

## 2023-06-27 DIAGNOSIS — N3281 Overactive bladder: Secondary | ICD-10-CM | POA: Diagnosis not present

## 2023-06-27 DIAGNOSIS — B009 Herpesviral infection, unspecified: Secondary | ICD-10-CM

## 2023-06-27 DIAGNOSIS — N3945 Continuous leakage: Secondary | ICD-10-CM

## 2023-06-27 DIAGNOSIS — Z9189 Other specified personal risk factors, not elsewhere classified: Secondary | ICD-10-CM | POA: Diagnosis not present

## 2023-06-27 DIAGNOSIS — M85852 Other specified disorders of bone density and structure, left thigh: Secondary | ICD-10-CM | POA: Diagnosis not present

## 2023-06-27 LAB — URINALYSIS, COMPLETE W/RFL CULTURE
Bacteria, UA: NONE SEEN /HPF
Bilirubin Urine: NEGATIVE
Glucose, UA: NEGATIVE
Hgb urine dipstick: NEGATIVE
Hyaline Cast: NONE SEEN /LPF
Ketones, ur: NEGATIVE
Leukocyte Esterase: NEGATIVE
Nitrites, Initial: NEGATIVE
Protein, ur: NEGATIVE
RBC / HPF: NONE SEEN /HPF (ref 0–2)
Specific Gravity, Urine: 1.015 (ref 1.001–1.035)
WBC, UA: NONE SEEN /HPF (ref 0–5)
pH: 5.5 (ref 5.0–8.0)

## 2023-06-27 LAB — NO CULTURE INDICATED

## 2023-06-27 MED ORDER — GEMTESA 75 MG PO TABS
1.0000 | ORAL_TABLET | Freq: Every day | ORAL | 2 refills | Status: DC
Start: 2023-06-27 — End: 2023-07-17

## 2023-06-27 NOTE — Progress Notes (Signed)
 Elveria Brakefield Degroote [birth year] [id number]   History:  78 y.o. G2P2 presents for breast and pelvic exam. Complains of worsening urinary incontinence. Has had urge incontinence for years but now she leaks constantly. Fills a large pad up. Denies other urinary symptoms. Tried pelvic floor PT last year with no improvement. Very active (walks 5+ miles per day) and it is very bothersome. Postmenopausal - stopped ERT a few years ago. S/P TAH BSO at age 89 for endometrioma with LSO for cyst. Normal pap and mammogram history. Hypothyroidism managed by PCP.   Gynecologic History No LMP recorded. Patient has had a hysterectomy.   Contraception: status post hysterectomy Sexually active: No  Health Maintenance Last Pap: 04/15/2017. Results were: Normal Last mammogram: 03/19/2023. Results were: Normal Last colonoscopy: 12/30/2013. Results were: Normal, 10-year recall Last Dexa: 02/23/2020. Results were: T-score -1.4  Past medical history, past surgical history, family history and social history were all reviewed and documented in the EPIC chart. Married. Moved to [place] recently. Loves to travel. Husband still works in Education officer, environmental. 2 children, 4 grandchildren - 68 yo twin boys, 28 and 34 yo girls, all siblings.   ROS:  A ROS was performed and pertinent positives and negatives are included.  Exam:  Vitals:   06/27/23 1144  BP: 112/76  Pulse: 75  SpO2: 95%  Weight: 104 lb (47.2 kg)  Height: 5' 1.25" (1.556 m)     Body mass index is 19.49 kg/m.  General appearance:  Normal Thyroid :  Symmetrical, normal in size, without palpable masses or nodularity. Respiratory  Auscultation:  Clear without wheezing or rhonchi Cardiovascular  Auscultation:  Regular rate, without rubs, murmurs or gallops  Edema/varicosities:  Not grossly evident Abdominal  Soft,nontender, without masses, guarding or rebound.  Liver/spleen:  No organomegaly noted  Hernia:  None appreciated  Skin  Inspection:  Grossly  normal Breasts: Examined lying and sitting.   Right: Without masses, retractions, nipple discharge or axillary adenopathy.   Left: Without masses, retractions, nipple discharge or axillary adenopathy. Pelvic: External genitalia:  no lesions              Urethra:  normal appearing urethra with no masses, tenderness or lesions              Bartholins and Skenes: normal                 Vagina: normal appearing vagina with normal color and discharge, no lesions. Atrophic changes              Cervix: absent Bimanual Exam:  Uterus:  absent              Adnexa: no mass, fullness, tenderness              Rectovaginal: Deferred              Anus:  normal, no lesions  Patient informed chaperone available to be present for breast and pelvic exam. Patient has requested no chaperone to be present. Patient has been advised what will be completed during breast and pelvic exam.   UA negative  Assessment/Plan:  78 y.o. G2P2 for breast and pelvic exam.   Encounter for breast and pelvic examination - Education provided on SBEs, importance of preventative screenings, current guidelines, high calcium diet, regular exercise, and multivitamin daily. Labs with PCP.   Postmenopausal - stopped ERT a few years ago. S/P TAH BSO at age 40 for endometrioma with LSO for cyst.  Osteopenia of necks of both  femurs - Plan: DG Bone Density. January 2022 T-score 1.4. She walks 5-6 miles per day at the park with friends and with walking her dog.   OAB (overactive bladder) - Plan: Vibegron (GEMTESA) 75 MG TABS daily. Written and verbal information provided. Has tried pelvic floor PT. Aware sent to specialty pharmacy.   Continuous leakage of urine - Plan: Urinalysis,Complete w/RFL Culture. Neg UA.   Screening for cervical cancer - Normal Pap history.  No longer screening per guidelines.   Screening for breast cancer - Normal mammogram history.  Continue annual screenings.  Normal breast exam today.  Screening for colon  cancer - 2015 colonoscopy. Will repeat at GI's recommended interval.   Return in about 1 year (around 06/26/2024) for Med follow up.    Andee Bamberger DNP, 12:31 PM 06/27/2023

## 2023-06-30 ENCOUNTER — Telehealth: Payer: Self-pay

## 2023-06-30 NOTE — Telephone Encounter (Signed)
 She received a text from them last week while still in the office. So I know they have contacted her. Maybe they are waiting on something from her [employer], etc).

## 2023-06-30 NOTE — Telephone Encounter (Signed)
 Patient called & left message on triage line. She stated a Gemtesa  rx was [facility] & she has not heard anything from them.  I called her back & gave her [facility] phone number. She stated she will call them. She will also talk to her pcp regarding the medication.

## 2023-06-30 NOTE — Telephone Encounter (Signed)
 I guess she forgot. She said she would call them & see.

## 2023-07-10 ENCOUNTER — Ambulatory Visit (HOSPITAL_BASED_OUTPATIENT_CLINIC_OR_DEPARTMENT_OTHER)
Admission: RE | Admit: 2023-07-10 | Discharge: 2023-07-10 | Disposition: A | Discharge status: Home / Self Care | Source: Ambulatory Visit | Attending: Nurse Practitioner | Admitting: Nurse Practitioner

## 2023-07-10 DIAGNOSIS — M85852 Other specified disorders of bone density and structure, left thigh: Secondary | ICD-10-CM | POA: Diagnosis not present

## 2023-07-10 DIAGNOSIS — M85851 Other specified disorders of bone density and structure, right thigh: Secondary | ICD-10-CM | POA: Diagnosis not present

## 2023-07-10 DIAGNOSIS — M8589 Other specified disorders of bone density and structure, multiple sites: Secondary | ICD-10-CM | POA: Diagnosis not present

## 2023-07-10 DIAGNOSIS — Z780 Asymptomatic menopausal state: Secondary | ICD-10-CM | POA: Diagnosis not present

## 2023-07-14 DIAGNOSIS — M542 Cervicalgia: Secondary | ICD-10-CM | POA: Diagnosis not present

## 2023-07-15 ENCOUNTER — Encounter: Payer: Self-pay | Admitting: Internal Medicine

## 2023-07-15 ENCOUNTER — Other Ambulatory Visit: Payer: Self-pay | Admitting: Internal Medicine

## 2023-07-16 ENCOUNTER — Other Ambulatory Visit: Payer: Self-pay

## 2023-07-16 DIAGNOSIS — N3281 Overactive bladder: Secondary | ICD-10-CM

## 2023-07-16 NOTE — Telephone Encounter (Signed)
 Patient LM on RX line: Needed refill on medication sent to [facility] pharmacy instead of mail order pharmacy, as she will be going out of the [place] on [place].   LM for patient TCB (ok per DPR) regarding refill request.  Need name of the medication and pharmacy information.

## 2023-07-17 ENCOUNTER — Telehealth: Payer: Self-pay | Admitting: Internal Medicine

## 2023-07-17 ENCOUNTER — Other Ambulatory Visit: Payer: Self-pay

## 2023-07-17 MED ORDER — GEMTESA 75 MG PO TABS
1.0000 | ORAL_TABLET | Freq: Every day | ORAL | 2 refills | Status: DC
Start: 2023-07-17 — End: 2023-09-26

## 2023-07-17 MED ORDER — LEVOTHYROXINE SODIUM 75 MCG PO TABS: ORAL_TABLET | ORAL | 3 refills | Status: DC

## 2023-07-17 NOTE — Telephone Encounter (Signed)
 Spoke with patient today via my-chart.

## 2023-07-17 NOTE — Telephone Encounter (Signed)
 Copied from CRM [id number]. Topic: General - Other >> Jul 17, 2023  9:40 AM Bambi Bonine D wrote: Reason for CRM: Pt is requesting to speak to [facility] regarding a medication concern and the pharmacy the medication needs to be sent to.

## 2023-07-17 NOTE — Telephone Encounter (Signed)
 Spoke to patient, patient states she received a 30 day supply of Gemtesa  from [facility] mail order pharmacy.  Insurance copay was $100.  Patient states she contacted her insurance company and they told her she needed to use [facility] mail order pharmacy.  Patient states she is going out of the country 08/01/23 and needs a new prescription sent to [facility] mail order pharmacy so she will have enough medication while she is out of the country.  Sent to provider for review and RX authorization.

## 2023-07-21 ENCOUNTER — Ambulatory Visit: Payer: Self-pay | Admitting: Nurse Practitioner

## 2023-07-23 ENCOUNTER — Other Ambulatory Visit: Payer: Self-pay

## 2023-07-23 MED ORDER — LEVOTHYROXINE SODIUM 75 MCG PO TABS: ORAL_TABLET | ORAL | 3 refills | Status: DC

## 2023-08-07 ENCOUNTER — Encounter: Payer: PPO | Admitting: Internal Medicine

## 2023-08-25 ENCOUNTER — Ambulatory Visit: Admitting: Nurse Practitioner

## 2023-08-25 ENCOUNTER — Encounter: Payer: Self-pay | Admitting: Nurse Practitioner

## 2023-08-25 VITALS — BP 110/70 | HR 67 | Ht 60.5 in | Wt 99.6 lb

## 2023-08-25 DIAGNOSIS — M8580 Other specified disorders of bone density and structure, unspecified site: Secondary | ICD-10-CM | POA: Diagnosis not present

## 2023-08-25 MED ORDER — ALENDRONATE SODIUM 70 MG PO TABS
70.0000 mg | ORAL_TABLET | ORAL | 3 refills | Status: AC
Start: 2023-08-25 — End: ?

## 2023-08-25 NOTE — Progress Notes (Signed)
   Acute Office Visit  Subjective:    Patient ID: Renee Santos, female    DOB: Jun 07, 1945, 78 y.o.   MRN: [id number]   HPI 78 y.o. presents today to discuss DXA. 07/10/2023 DXA showed T-score -2.2 with increased hip fracture risk of 11.8%. Risk for major fracture 20.0%. Has never been on bone medication. 2022 T-score -1.4, 14% / 4.5%. Very active with walking. Walks 5+ miles per day. Taking Vit D + Calcium. Mother with history of osteoporosis. Did 80 mile hikerecently and experienced weird back symptoms. She wouldn't really describe it as painful but friends found she was walking leaning over. Got back brace and seeing PT this week.   No LMP recorded. Patient has had a hysterectomy.    Review of Systems  Constitutional: Negative.        Objective:    Physical Exam Constitutional:      Appearance: Normal appearance.     BP 110/70 (BP Location: Right Arm, Patient Position: Sitting, Cuff Size: Normal)   Pulse 67   Ht 5' 0.5 (1.537 m)   Wt 99 lb 9.6 oz (45.2 kg)   SpO2 98%   BMI 19.13 kg/m  Wt Readings from Last 3 Encounters:  08/25/23 99 lb 9.6 oz (45.2 kg)  06/27/23 104 lb (47.2 kg)  06/23/23 101 lb (45.8 kg)          Assessment & Plan:   Problem List Items Addressed This Visit   None Visit Diagnoses       Osteopenia with high risk of fracture    -  Primary   Relevant Medications   alendronate  (FOSAMAX ) 70 MG tablet      Plan: DXA report reviewed showing osteopenia with increased hip fracture risk. Recommend incorporating resistance training, continue Vit D + Calcium. Discussed medication management with bisphosphonates - PO, SQ and IV. Will start Fosamax . Educated on proper use - take once a week on empty stomach with full glass of water. Do not lay down for 30-60 minutes after.  Potential for serious but rare side effects of jaw necrosis and atypical femur fracture reviewed.     Annabella DELENA Shutter DNP, 2:07 PM 08/25/2023

## 2023-08-26 ENCOUNTER — Other Ambulatory Visit: Payer: Self-pay | Admitting: Internal Medicine

## 2023-08-26 DIAGNOSIS — I10 Essential (primary) hypertension: Secondary | ICD-10-CM

## 2023-08-26 MED ORDER — HYDROCHLOROTHIAZIDE 25 MG PO TABS
ORAL_TABLET | ORAL | 3 refills | Status: DC
Start: 2023-08-26 — End: 2023-10-27

## 2023-08-26 MED ORDER — METOPROLOL SUCCINATE ER 50 MG PO TB24: ORAL_TABLET | ORAL | 3 refills | Status: AC

## 2023-08-26 NOTE — Telephone Encounter (Signed)
 Copied from CRM [id number]. Topic: Clinical - Medication Refill >> Aug 26, 2023  3:51 PM Viola F wrote: Medication: metoprolol  succinate (TOPROL -XL) 50 MG 24 hr tablet [[id number]] hydrochlorothiazide  (HYDRODIURIL ) 25 MG tablet [[id number]]  Has the patient contacted their pharmacy? Yes (Agent: If no, request that the patient contact the pharmacy for the refill. If patient does not wish to contact the pharmacy document the reason why and proceed with request.) (Agent: If yes, when and what did the pharmacy advise?)  This is the patient's preferred pharmacy:   [facility], [place] - [id number] [place] [address] [place] [id number] Phone: [phone] Fax: [phone]   Is this the correct pharmacy for this prescription? Yes If no, delete pharmacy and type the correct one.   Has the prescription been filled recently? Yes  Is the patient out of the medication? Yes  Has the patient been seen for an appointment in the last year OR does the patient have an upcoming appointment? Yes  Can we respond through MyChart? Yes  Agent: Please be advised that Rx refills may take up to 3 business days. We ask that you follow-up with your pharmacy.

## 2023-08-30 DIAGNOSIS — M542 Cervicalgia: Secondary | ICD-10-CM | POA: Diagnosis not present

## 2023-08-30 DIAGNOSIS — M5126 Other intervertebral disc displacement, lumbar region: Secondary | ICD-10-CM | POA: Diagnosis not present

## 2023-09-15 DIAGNOSIS — M542 Cervicalgia: Secondary | ICD-10-CM | POA: Diagnosis not present

## 2023-09-15 DIAGNOSIS — M5126 Other intervertebral disc displacement, lumbar region: Secondary | ICD-10-CM | POA: Diagnosis not present

## 2023-09-26 ENCOUNTER — Other Ambulatory Visit: Payer: Self-pay

## 2023-09-26 DIAGNOSIS — N3281 Overactive bladder: Secondary | ICD-10-CM

## 2023-09-26 NOTE — Telephone Encounter (Signed)
 Med refill request: Gemtesa  Last AEX: 06/27/23 Next AEX: 06/30/23 Last MMG (if hormonal med) 03/19/23 BIRADS Cat 1 neg  Refill authorized: Last rx #30 with 2 refills. Please approve or deny

## 2023-09-29 MED ORDER — GEMTESA 75 MG PO TABS
1.0000 | ORAL_TABLET | Freq: Every day | ORAL | 2 refills | Status: DC
Start: 2023-09-29 — End: 2023-12-02

## 2023-10-03 ENCOUNTER — Ambulatory Visit: Admitting: Physician Assistant

## 2023-10-03 ENCOUNTER — Ambulatory Visit

## 2023-10-06 DIAGNOSIS — M5126 Other intervertebral disc displacement, lumbar region: Secondary | ICD-10-CM | POA: Diagnosis not present

## 2023-10-06 DIAGNOSIS — M542 Cervicalgia: Secondary | ICD-10-CM | POA: Diagnosis not present

## 2023-10-07 ENCOUNTER — Other Ambulatory Visit: Payer: Self-pay | Admitting: Internal Medicine

## 2023-10-08 ENCOUNTER — Telehealth: Payer: Self-pay

## 2023-10-08 ENCOUNTER — Ambulatory Visit (INDEPENDENT_AMBULATORY_CARE_PROVIDER_SITE_OTHER): Payer: PPO

## 2023-10-08 VITALS — Ht 60.0 in | Wt 99.0 lb

## 2023-10-08 DIAGNOSIS — Z0000 Encounter for general adult medical examination without abnormal findings: Secondary | ICD-10-CM

## 2023-10-08 DIAGNOSIS — Z1211 Encounter for screening for malignant neoplasm of colon: Secondary | ICD-10-CM

## 2023-10-08 MED ORDER — LEVOTHYROXINE SODIUM 75 MCG PO TABS: ORAL_TABLET | ORAL | 3 refills | Status: AC

## 2023-10-08 NOTE — Progress Notes (Signed)
 Subjective:   Renee Santos is a 78 y.o. who presents for a Medicare Wellness preventive visit.  As a reminder, Annual Wellness Visits don't include a physical exam, and some assessments may be limited, especially if this visit is performed virtually. We may recommend an in-person follow-up visit with your provider if needed.  Visit Complete: Virtual I connected with  Renee Santos on 10/08/23 by a audio enabled telemedicine application and verified that I am speaking with the correct person using two identifiers.  Patient Location: Home  Provider Location: Home Office  I discussed the limitations of evaluation and management by telemedicine. The patient expressed understanding and agreed to proceed.  Vital Signs: Because this visit was a virtual/telehealth visit, some criteria may be missing or patient reported. Any vitals not documented were not able to be obtained and vitals that have been documented are patient reported.  VideoDeclined- This patient declined Librarian, academic. Therefore the visit was completed with audio only.  Persons Participating in Visit: Patient.  AWV Questionnaire: No: Patient Medicare AWV questionnaire was not completed prior to this visit.  Cardiac Risk Factors include: hypertension;advanced age (>73men, >32 women);Other (see comment);dyslipidemia, Risk factor comments: Stage 3a CKD     Objective:    Today's Vitals   10/08/23 0806  Weight: 99 lb (44.9 kg)  Height: 5' (1.524 m)   Body mass index is 19.33 kg/m.     10/08/2023    8:15 AM 12/17/2022    7:32 PM 10/07/2022    8:50 AM 09/12/2022    6:35 AM 10/05/2021    9:26 AM 09/13/2020    2:52 PM 09/13/2020    1:35 PM  Advanced Directives  Does Patient Have a Medical Advance Directive? Yes No Yes No Yes Yes Yes  Type of Estate agent of [place];Living will  Healthcare Power of [place];Living will  Healthcare Power of [place];Living will Living  will;Healthcare Power of Attorney Living will;Healthcare Power of Attorney  Does patient want to make changes to medical advance directive?       No - Patient declined  Copy of Healthcare Power of Attorney in Chart? No - copy requested  No - copy requested  No - copy requested No - copy requested No - copy requested  Would patient like information on creating a medical advance directive?  No - Patient declined  No - Patient declined       Current Medications (verified) Outpatient Encounter Medications as of 10/08/2023  Medication Sig   alendronate  (FOSAMAX ) 70 MG tablet Take 1 tablet (70 mg total) by mouth every 7 (seven) days. Take with a full glass of water on an empty stomach.   BIOTIN FORTE PO Take 1 tablet by mouth.   Calcium Carbonate-Vit D-Min (CALTRATE PLUS PO) Take by mouth daily.     clobetasol  cream (TEMOVATE ) 0.05 % Apply 1 application topically 2 (two) times daily.   Cyanocobalamin  (VITAMIN B-12 PO) Take by mouth.   hydrochlorothiazide  (HYDRODIURIL ) 25 MG tablet Take 1/2 tablet by mouth daily   levothyroxine  (SYNTHROID ) 75 MCG tablet Take 1 tablet by mouth 6 days a week   LORazepam  (ATIVAN ) 0.5 MG tablet Take 1 tablet by mouth twice daily as needed for anxiety   metoprolol  succinate (TOPROL -XL) 50 MG 24 hr tablet Take 1/2 tablet by mouth every day   multivitamin-lutein (OCUVITE-LUTEIN) CAPS capsule Take 1 capsule by mouth daily.   traZODone  (DESYREL ) 50 MG tablet Take 1 tablet (50 mg total) by mouth  at bedtime.   valACYclovir (VALTREX) 1000 MG tablet Take 1,000 mg by mouth daily.   Vibegron  (GEMTESA ) 75 MG TABS Take 1 tablet (75 mg total) by mouth daily.   No facility-administered encounter medications on file as of 10/08/2023.    Allergies (verified) Patient has no known allergies.   History: Past Medical History:  Diagnosis Date   Cancer (HCC) 2011   Basal Cell of nose; Dr Swaziland   Herpes zoster 2008   posterior thorax   Hypertension    Neurofibroma of back 2013     Dr Amy Swaziland    Osteopenia 2016   T score -1.6 FRAX not calculated because of HRT   Psoriasis    Dr Amy [place]and   Thyroid  nodule    Past Surgical History:  Procedure Laterality Date   ABDOMINAL HYSTERECTOMY      LSO  & appendectomy for endometrioma   @ age38 for ovarian cyst   APPENDECTOMY     cataract Bilateral Dec 2016;   not sure of date; wears readers only    COLONOSCOPY  multiple   OOPHORECTOMY     LSO for cyst   TONSILLECTOMY     Family History  Problem Relation Age of Onset   Hypertension Father    Lung cancer Father        remote smoker, deceased 75   Hyperlipidemia Mother        CABG in late 106s   Hypothyroidism Mother    Macular degeneration Mother    Hypertension Mother    Coronary artery disease Mother        bypass surgery   Stroke Mother 4       Deceased   Coronary artery disease Maternal Grandmother    Asthma Maternal Uncle        2   Asthma Maternal Grandfather    Diabetes Neg Hx    Heart attack Neg Hx    Colon cancer Neg Hx    Social History   Socioeconomic History   Marital status: Married    Spouse name: Lamar   Number of children: 2   Years of education: Not on file   Highest education level: Bachelor's degree (e.g., BA, AB, BS)  Occupational History   Occupation: RETIRED  Tobacco Use   Smoking status: Former   Smokeless tobacco: Never   Tobacco comments:    college only socially  Vaping Use   Vaping status: Never Used  Substance and Sexual Activity   Alcohol use: Yes    Alcohol/week: 6.0 standard drinks of alcohol    Types: 6 Glasses of wine per week   Drug use: No   Sexual activity: Not Currently    Birth control/protection: Surgical    Comment: HYST-1st intercourse 78 yo-Fewer than 5 partners  Other Topics Concern   Not on file  Social History Narrative   Lives with husband and has 1 dog/2025  x 45 years in a two story home.  Has 2 children.     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     Education: college.   Social Drivers of  Corporate investment banker Strain: Low Risk  (10/08/2023)   Overall Financial Resource Strain (CARDIA)    Difficulty of Paying Living Expenses: Not hard at all  Food Insecurity: No Food Insecurity (10/08/2023)   Hunger Vital Sign    Worried About Running Out of Food in the Last Year: Never true    Ran Out of Food in the Last  Year: Never true  Transportation Needs: No Transportation Needs (10/08/2023)   PRAPARE - Administrator, Civil Service (Medical): No    Lack of Transportation (Non-Medical): No  Physical Activity: Unknown (10/08/2023)   Exercise Vital Sign    Days of Exercise per Week: 5 days    Minutes of Exercise per Session: Not on file  Stress: No Stress Concern Present (10/08/2023)   [employer] of Occupational Health - Occupational Stress Questionnaire    Feeling of Stress: Not at all  Social Connections: Socially Integrated (10/08/2023)   Social Connection and Isolation Panel    Frequency of Communication with Friends and Family: More than three times a week    Frequency of Social Gatherings with Friends and Family: More than three times a week    Attends Religious Services: More than 4 times per year    Active Member of [employer] or Organizations: Yes    Attends Banker Meetings: Never    Marital Status: Married    Tobacco Counseling Counseling given: Not Answered Tobacco comments: college only socially    Clinical Intake:  Pre-visit preparation completed: Yes  Pain : No/denies pain     BMI - recorded: 19.33 Nutritional Status: BMI <19  Underweight Nutritional Risks: None Diabetes: No  Lab Results  Component Value Date   HGBA1C 5.3 02/02/2019   HGBA1C 5.4 01/27/2018   HGBA1C 5.4 01/09/2017     How often do you need to have someone help you when you read instructions, pamphlets, or other written materials from your doctor or pharmacy?: 1 - Never  Interpreter Needed?: No  Information entered by :: Amany Rando,  RMA   Activities of Daily Living     10/08/2023    8:07 AM  In your present state of health, do you have any difficulty performing the following activities:  Hearing? 0  Vision? 0  Difficulty concentrating or making decisions? 0  Walking or climbing stairs? 0  Dressing or bathing? 0  Doing errands, shopping? 0  Preparing Food and eating ? N  Using the Toilet? N  In the past six months, have you accidently leaked urine? N  Do you have problems with loss of bowel control? N  Managing your Medications? N  Managing your Finances? N  Housekeeping or managing your Housekeeping? N    Patient Care Team: Geofm Glade PARAS, MD as PCP - General (Internal Medicine) Swaziland, Amy, MD as Consulting Physician (Dermatology) Rockney, Evalene SQUIBB, MD (Inactive) as Consulting Physician (Gynecology) Szabat, Toribio BROCKS, [facility] (Inactive) as Pharmacist (Pharmacist) Camillo Golas, MD as Consulting Physician (Ophthalmology) Tomie Roz SAILOR, LPN as Licensed Practical Nurse (Internal Medicine)  I have updated your Care Teams any recent Medical Services you may have received from other providers in the past year.     Assessment:   This is a routine wellness examination for [facility].  Hearing/Vision screen Hearing Screening - Comments:: Denies hearing difficulties   Vision Screening - Comments:: Wears eyeglasses/ [facility]    Goals Addressed             This Visit's Progress    Patient Stated: Join health center at [facility] which would include yoga and stretching.   On track      Depression Screen     10/08/2023    8:18 AM 10/07/2022    8:50 AM 09/26/2022    9:13 AM 08/07/2022   10:43 AM 10/05/2021    9:23 AM 10/10/2020   11:33 AM 09/13/2020  2:41 PM  PHQ 2/9 Scores  PHQ - 2 Score 0 0 0 0 0 0 0  PHQ- 9 Score 2 2 2    0     Fall Risk     10/08/2023    8:16 AM 06/27/2023   11:47 AM 10/07/2022    8:50 AM 10/06/2022    9:03 AM 09/26/2022    9:13 AM  Fall Risk   Falls in the past year? 0 0 0  0 0  Number falls in past yr: 0 0 0 0 0  Injury with Fall? 0 0 0 0 0  Risk for fall due to :  No Fall Risks No Fall Risks  No Fall Risks  Follow up Falls evaluation completed;Falls prevention discussed Falls evaluation completed Falls prevention discussed  Falls evaluation completed    MEDICARE RISK AT HOME:  Medicare Risk at Home Any stairs in or around the home?: No If so, are there any without handrails?: No Home free of loose throw rugs in walkways, pet beds, electrical cords, etc?: Yes Adequate lighting in your home to reduce risk of falls?: Yes Life alert?: No Use of a cane, walker or w/c?: No Grab bars in the bathroom?: Yes Shower chair or bench in shower?: Yes Elevated toilet seat or a handicapped toilet?: Yes  TIMED UP AND GO:  Was the test performed?  No  Cognitive Function: Declined/Normal: No cognitive concerns noted by patient or family. Patient alert, oriented, able to answer questions appropriately and recall recent events. No signs of memory loss or confusion.    12/24/2016    8:18 AM  MMSE - Mini Mental State Exam  Orientation to time 5   Orientation to Place 5   Registration 3   Attention/ Calculation 3   Recall 2   Language- name 2 objects 2   Language- repeat 1  Language- follow 3 step command 3   Language- read & follow direction 1   Write a sentence 1   Copy design 1   Total score 27      Data saved with a previous flowsheet row definition        10/07/2022    8:59 AM 10/05/2021    9:27 AM  6CIT Screen  What Year? 0 points 0 points  What month? 0 points 0 points  What time? 0 points 0 points  Count back from 20 0 points 0 points  Months in reverse 0 points 0 points  Repeat phrase 0 points 0 points  Total Score 0 points 0 points    Immunizations Immunization History  Administered Date(s) Administered   Fluad Quad(high Dose 65+) 10/07/2018, 11/20/2020, 11/01/2021   H1N1 03/01/2008   Hepatitis A 07/05/1997, 04/19/2004   Hepatitis B  05/30/2008, 07/05/2008, 03/27/2011   INFLUENZA, HIGH DOSE SEASONAL PF 12/27/2015, 10/08/2016, 11/03/2019   Influenza Split 12/04/2010, 11/05/2011   Influenza,inj,Quad PF,6+ Mos 12/03/2012, 12/15/2013, 12/21/2014, 12/01/2017   Influenza-Unspecified 10/30/2017, 11/11/2017   Meningococcal Conjugate 07/05/2008   PFIZER(Purple Top)SARS-COV-2 Vaccination 03/08/2019, 03/29/2019, 11/19/2019, 05/19/2020   Pfizer Covid-19 Vaccine Bivalent Booster 76yrs & up 01/05/2021, 11/01/2021   Pneumococcal Conjugate-13 06/15/2014   Pneumococcal Polysaccharide-23 04/13/2013   Tdap 04/21/2017   Tetanus 02/21/2014   Typhoid Inactivated 04/19/2004, 04/21/2017   Yellow Fever 05/30/2008   Zoster, Live 02/18/2014    Screening Tests Health Maintenance  Topic Date Due   Zoster Vaccines- Shingrix (1 of 2) 05/02/1997   COVID-19 Vaccine (7 - 2024-25 season) 10/13/2022   Medicare Annual Wellness (AWV)  10/07/2023   INFLUENZA VACCINE  09/12/2023   MAMMOGRAM  03/18/2024   DEXA SCAN  07/10/2026   DTaP/Tdap/Td (2 - Td or Tdap) 04/22/2027   Pneumococcal Vaccine: 50+ Years  Completed   Hepatitis C Screening  Completed   HPV VACCINES  Aged Out   Meningococcal B Vaccine  Aged Out   Hepatitis B Vaccines 19-59 Average Risk  Discontinued   Colonoscopy  Discontinued    Health Maintenance  Health Maintenance Due  Topic Date Due   Zoster Vaccines- Shingrix (1 of 2) 05/02/1997   COVID-19 Vaccine (7 - 2024-25 season) 10/13/2022   Medicare Annual Wellness (AWV)  10/07/2023   INFLUENZA VACCINE  09/12/2023   Health Maintenance Items Addressed: Referral sent to GI for colonoscopy, See Nurse Notes at the end of this note  Additional Screening:  Vision Screening: Recommended annual ophthalmology exams for early detection of glaucoma and other disorders of the eye. Would you like a referral to an eye doctor? No    Dental Screening: Recommended annual dental exams for proper oral hygiene  Community Resource Referral /  Chronic Care Management: CRR required this visit?  No   CCM required this visit?  No   Plan:    I have personally reviewed and noted the following in the patient's chart:   Medical and social history Use of alcohol, tobacco or illicit drugs  Current medications and supplements including opioid prescriptions. Patient is not currently taking opioid prescriptions. Functional ability and status Nutritional status Physical activity Advanced directives List of other physicians Hospitalizations, surgeries, and ER visits in previous 12 months Vitals Screenings to include cognitive, depression, and falls Referrals and appointments  In addition, I have reviewed and discussed with patient certain preventive protocols, quality metrics, and best practice recommendations. A written personalized care plan for preventive services as well as general preventive health recommendations were provided to patient.   Juleah Paradise L Juell Radney, CMA   10/08/2023   After Visit Summary: (MyChart) Due to this being a telephonic visit, the after visit summary with patients personalized plan was offered to patient via MyChart   Notes: Patient is due for a colonoscopy and order has been placed today.  She would like to discuss a Shingrix vaccine during her next office visit.  She would like to get a Flu vaccine during her next office visit also.  Patient is requesting a refill for levothyroxine  (SYNTHROID ) 75 MCG tablet, daily, today.  She would like this refill to be sent into [facility] in her chart.

## 2023-10-08 NOTE — Telephone Encounter (Signed)
 Patient is requesting a refill for levothyroxine  (SYNTHROID ) 75 MCG tablet, daily, today.  She would like this refill to be sent into [facility] pharmacy in her chart.

## 2023-10-08 NOTE — Patient Instructions (Signed)
 Ms. Fowers , Thank you for taking time out of your busy schedule to complete your Annual Wellness Visit with me. I enjoyed our conversation and look forward to speaking with you again next year. I, as well as your care team,  appreciate your ongoing commitment to your health goals. Please review the following plan we discussed and let me know if I can assist you in the future. Your Game plan/ To Do List    Referrals: If you haven't heard from the office you've been referred to for a colonoscopy, please reach out to them at the phone provided.   [facility] [address] [place] [address]th [address]  [phone]  Follow up Visits: We will see or speak with you next year for your Next Medicare AWV with our clinical staff Have you seen your provider in the last 6 months (3 months if uncontrolled diabetes)? Yes.  Last office visit on 06/23/2023.  Next office visit on 10/27/2023.  Clinician Recommendations:  Aim for 30 minutes of exercise or brisk walking, 6-8 glasses of water, and 5 servings of fruits and vegetables each day. You are also due a Shingles vaccine, which can be done at your local pharmacy and please discuss with provider during your next office visit.  You can get a Flu vaccine during your next office visit with Dr. Geofm.        This is a list of the screenings recommended for you:  Health Maintenance  Topic Date Due   Zoster (Shingles) Vaccine (1 of 2) 05/02/1997   COVID-19 Vaccine (7 - 2024-25 season) 10/13/2022   Medicare Annual Wellness Visit  10/07/2023   Flu Shot  09/12/2023   Mammogram  03/18/2024   DEXA scan (bone density measurement)  07/10/2026   DTaP/Tdap/Td vaccine (2 - Td or Tdap) 04/22/2027   Pneumococcal Vaccine for age over 36  Completed   Hepatitis C Screening  Completed   HPV Vaccine  Aged Out   Meningitis B Vaccine  Aged Out   Hepatitis B Vaccine  Discontinued   Colon Cancer Screening  Discontinued    Advanced directives: (Copy Requested)  Please bring a copy of your health care power of attorney and living will to the office to be added to your chart at your convenience. You can mail to [facility] [address] [place], [place] [address] or email to [email or web address] Advance Care Planning is important because it:  [x]  Makes sure you receive the medical care that is consistent with your values, goals, and preferences  [x]  It provides guidance to your family and loved ones and reduces their decisional burden about whether or not they are making the right decisions based on your wishes.  Follow the link provided in your after visit summary or read over the paperwork we have mailed to you to help you started getting your Advance Directives in place. If you need assistance in completing these, please reach out to us  so that we can help you!  See attachments for Preventive Care and Fall Prevention Tips.

## 2023-10-08 NOTE — Addendum Note (Signed)
 Addended by: GEOFM GLADE PARAS on: 10/08/2023 01:06 PM   Modules accepted: Orders

## 2023-10-26 NOTE — Progress Notes (Signed)
 Subjective:    Patient ID: Renee Santos, female    DOB: [birth year], 78 y.o.   MRN: [id number]      HPI Renee Santos is here for a Physical exam and her chronic medical problems.   Doing well.  No big concerns.    Medications and allergies reviewed with patient and updated if appropriate.  Current Outpatient Medications on File Prior to Visit  Medication Sig Dispense Refill   alendronate  (FOSAMAX ) 70 MG tablet Take 1 tablet (70 mg total) by mouth every 7 (seven) days. Take with a full glass of water on an empty stomach. 12 tablet 3   BIOTIN FORTE PO Take 1 tablet by mouth.     Calcium Carbonate-Vit D-Min (CALTRATE PLUS PO) Take by mouth daily.       clobetasol  cream (TEMOVATE ) 0.05 % Apply 1 application topically 2 (two) times daily. 45 g 1   Cyanocobalamin  (VITAMIN B-12 PO) Take by mouth.     hydrochlorothiazide  (HYDRODIURIL ) 25 MG tablet Take 1/2 tablet by mouth daily 45 tablet 3   levothyroxine  (SYNTHROID ) 75 MCG tablet Take 1 tablet by mouth 6 days a week 77 tablet 3   LORazepam  (ATIVAN ) 0.5 MG tablet Take 1 tablet by mouth twice daily as needed for anxiety 30 tablet 2   metoprolol  succinate (TOPROL -XL) 50 MG 24 hr tablet Take 1/2 tablet by mouth every day 45 tablet 3   multivitamin-lutein (OCUVITE-LUTEIN) CAPS capsule Take 1 capsule by mouth daily.     traZODone  (DESYREL ) 50 MG tablet Take 1 tablet (50 mg total) by mouth at bedtime. 30 tablet 5   valACYclovir (VALTREX) 1000 MG tablet Take 1,000 mg by mouth daily.     Vibegron  (GEMTESA ) 75 MG TABS Take 1 tablet (75 mg total) by mouth daily. 30 tablet 2   No current facility-administered medications on file prior to visit.    Review of Systems  Constitutional:  Negative for fever.  Eyes:  Negative for visual disturbance.  Respiratory:  Negative for cough, shortness of breath and wheezing.   Cardiovascular:  Negative for chest pain, palpitations and leg swelling.  Gastrointestinal:  Positive for constipation. Negative for  abdominal pain, blood in stool and diarrhea.       No gerd  Genitourinary:  Negative for dysuria.  Musculoskeletal:  Negative for arthralgias and back pain.  Skin:  Negative for rash.  Neurological:  Negative for light-headedness and headaches.  Psychiatric/Behavioral:  Negative for dysphoric mood. The patient is not nervous/anxious.        Objective:   Vitals:   10/27/23 1445  BP: 106/68  Pulse: 67  Temp: 98.2 F (36.8 C)  SpO2: 97%   Filed Weights   10/27/23 1445  Weight: 98 lb 9.6 oz (44.7 kg)   Body mass index is 19.26 kg/m.  BP Readings from Last 3 Encounters:  10/27/23 106/68  08/25/23 110/70  06/27/23 112/76    Wt Readings from Last 3 Encounters:  10/27/23 98 lb 9.6 oz (44.7 kg)  10/08/23 99 lb (44.9 kg)  08/25/23 99 lb 9.6 oz (45.2 kg)       Physical Exam Constitutional: She appears well-developed and well-nourished. No distress.  HENT:  Head: Normocephalic and atraumatic.  Right Ear: External ear normal. Normal ear canal and TM Left Ear: External ear normal.  Normal ear canal and TM Mouth/Throat: Oropharynx is clear and moist.  Eyes: Conjunctivae normal.  Neck: Neck supple. No tracheal deviation present. No thyromegaly present.  No carotid bruit  Cardiovascular: Normal rate, regular rhythm and normal heart sounds.   No murmur heard.  No edema. Pulmonary/Chest: Effort normal and breath sounds normal. No respiratory distress. She has no wheezes. She has no rales.  Breast: deferred   Abdominal: Soft. She exhibits no distension. There is no tenderness.  Lymphadenopathy: She has no cervical adenopathy.  Skin: Skin is warm and dry. She is not diaphoretic.  Psychiatric: She has a normal mood and affect. Her behavior is normal.     Lab Results  Component Value Date   WBC 5.6 08/07/2022   HGB 13.5 08/07/2022   HCT 40.9 08/07/2022   PLT 217.0 08/07/2022   GLUCOSE 91 08/07/2022   CHOL 206 (H) 08/07/2022   TRIG 58.0 08/07/2022   HDL 100.40  08/07/2022   LDLDIRECT 96.0 11/06/2010   LDLCALC 94 08/07/2022   ALT 18 08/07/2022   AST 26 08/07/2022   NA 136 08/07/2022   K 4.0 08/07/2022   CL 98 08/07/2022   CREATININE 1.00 10/23/2022   BUN 22 08/07/2022   CO2 31 08/07/2022   TSH 1.85 06/23/2023   HGBA1C 5.3 02/02/2019         Assessment & Plan:   Physical exam: Screening blood work  ordered Exercise  walking - about 6 miles a day Weight  normal Substance abuse  none   Reviewed recommended immunizations.   Health Maintenance  Topic Date Due   Zoster Vaccines- Shingrix (1 of 2) 05/02/1997   Influenza Vaccine  09/12/2023   COVID-19 Vaccine (7 - 2025-26 season) 10/13/2023   Mammogram  03/18/2024   Medicare Annual Wellness (AWV)  10/07/2024   DEXA SCAN  07/10/2026   DTaP/Tdap/Td (2 - Td or Tdap) 04/22/2027   Pneumococcal Vaccine: 50+ Years  Completed   Hepatitis C Screening  Completed   HPV VACCINES  Aged Out   Meningococcal B Vaccine  Aged Out   Hepatitis B Vaccines 19-59 Average Risk  Discontinued   Colonoscopy  Discontinued          See Problem List for Assessment and Plan of chronic medical problems.

## 2023-10-26 NOTE — Patient Instructions (Addendum)
 Blood work was ordered.       Medications changes include :   stop hydrochlorothiazide  - monitor BP - ideally should be less than 130/80 -- if your BP goes above that restart the medication    A Echo was ordered and someone will call you to schedule an appointment.     Return in about 1 year (around 10/26/2024) for Physical Exam.    Health Maintenance, Female Adopting a healthy lifestyle and getting preventive care are important in promoting health and wellness. Ask your health care provider about: The right schedule for you to have regular tests and exams. Things you can do on your own to prevent diseases and keep yourself healthy. What should I know about diet, weight, and exercise? Eat a healthy diet  Eat a diet that includes plenty of vegetables, fruits, low-fat dairy products, and lean protein. Do not eat a lot of foods that are high in solid fats, added sugars, or sodium. Maintain a healthy weight Body mass index (BMI) is used to identify weight problems. It estimates body fat based on height and weight. Your health care provider can help determine your BMI and help you achieve or maintain a healthy weight. Get regular exercise Get regular exercise. This is one of the most important things you can do for your health. Most adults should: Exercise for at least 150 minutes each week. The exercise should increase your heart rate and make you sweat (moderate-intensity exercise). Do strengthening exercises at least twice a week. This is in addition to the moderate-intensity exercise. Spend less time sitting. Even light physical activity can be beneficial. Watch cholesterol and blood lipids Have your blood tested for lipids and cholesterol at 76 years of age, then have this test every 5 years. Have your cholesterol levels checked more often if: Your lipid or cholesterol levels are high. You are older than 76 years of age. You are at high risk for heart disease. What  should I know about cancer screening? Depending on your health history and family history, you may need to have cancer screening at various ages. This may include screening for: Breast cancer. Cervical cancer. Colorectal cancer. Skin cancer. Lung cancer. What should I know about heart disease, diabetes, and high blood pressure? Blood pressure and heart disease High blood pressure causes heart disease and increases the risk of stroke. This is more likely to develop in people who have high blood pressure readings or are overweight. Have your blood pressure checked: Every 3-5 years if you are 67-8 years of age. Every year if you are 79 years old or older. Diabetes Have regular diabetes screenings. This checks your fasting blood sugar level. Have the screening done: Once every three years after age 76 if you are at a normal weight and have a low risk for diabetes. More often and at a younger age if you are overweight or have a high risk for diabetes. What should I know about preventing infection? Hepatitis B If you have a higher risk for hepatitis B, you should be screened for this virus. Talk with your health care provider to find out if you are at risk for hepatitis B infection. Hepatitis C Testing is recommended for: Everyone born from [birth year] through 1963. Anyone with known risk factors for hepatitis C. Sexually transmitted infections (STIs) Get screened for STIs, including gonorrhea and chlamydia, if: You are sexually active and are younger than 76 years of age. You are older than 76 years of  age and your health care provider tells you that you are at risk for this type of infection. Your sexual activity has changed since you were last screened, and you are at increased risk for chlamydia or gonorrhea. Ask your health care provider if you are at risk. Ask your health care provider about whether you are at high risk for HIV. Your health care provider may recommend a prescription medicine  to help prevent HIV infection. If you choose to take medicine to prevent HIV, you should first get tested for HIV. You should then be tested every 3 months for as long as you are taking the medicine. Pregnancy If you are about to stop having your period (premenopausal) and you may become pregnant, seek counseling before you get pregnant. Take 400 to 800 micrograms (mcg) of folic acid every day if you become pregnant. Ask for birth control (contraception) if you want to prevent pregnancy. Osteoporosis and menopause Osteoporosis is a disease in which the bones lose minerals and strength with aging. This can result in bone fractures. If you are 33 years old or older, or if you are at risk for osteoporosis and fractures, ask your health care provider if you should: Be screened for bone loss. Take a calcium or vitamin D  supplement to lower your risk of fractures. Be given hormone replacement therapy (HRT) to treat symptoms of menopause. Follow these instructions at home: Alcohol use Do not drink alcohol if: Your health care provider tells you not to drink. You are pregnant, may be pregnant, or are planning to become pregnant. If you drink alcohol: Limit how much you have to: 0-1 drink a day. Know how much alcohol is in your drink. In the [place] one drink equals one 12 oz bottle of beer (355 mL), one 5 oz glass of wine (148 mL), or one 1 oz glass of hard liquor (44 mL). Lifestyle Do not use any products that contain nicotine or tobacco. These products include cigarettes, chewing tobacco, and vaping devices, such as e-cigarettes. If you need help quitting, ask your health care provider. Do not use street drugs. Do not share needles. Ask your health care provider for help if you need support or information about quitting drugs. General instructions Schedule regular health, dental, and eye exams. Stay current with your vaccines. Tell your health care provider if: You often feel depressed. You  have ever been abused or do not feel safe at home. Summary Adopting a healthy lifestyle and getting preventive care are important in promoting health and wellness. Follow your health care provider's instructions about healthy diet, exercising, and getting tested or screened for diseases. Follow your health care provider's instructions on monitoring your cholesterol and blood pressure. This information is not intended to replace advice given to you by your health care provider. Make sure you discuss any questions you have with your health care provider. Document Revised: 06/19/2020 Document Reviewed: 06/19/2020 Elsevier Patient Education  2024 [employer].

## 2023-10-27 ENCOUNTER — Ambulatory Visit: Payer: Self-pay | Admitting: Internal Medicine

## 2023-10-27 ENCOUNTER — Encounter: Payer: Self-pay | Admitting: Internal Medicine

## 2023-10-27 ENCOUNTER — Ambulatory Visit: Admitting: Internal Medicine

## 2023-10-27 VITALS — BP 106/68 | HR 67 | Temp 98.2°F | Ht 60.0 in | Wt 98.6 lb

## 2023-10-27 DIAGNOSIS — I10 Essential (primary) hypertension: Secondary | ICD-10-CM | POA: Diagnosis not present

## 2023-10-27 DIAGNOSIS — M810 Age-related osteoporosis without current pathological fracture: Secondary | ICD-10-CM | POA: Diagnosis not present

## 2023-10-27 DIAGNOSIS — I059 Rheumatic mitral valve disease, unspecified: Secondary | ICD-10-CM | POA: Diagnosis not present

## 2023-10-27 DIAGNOSIS — E538 Deficiency of other specified B group vitamins: Secondary | ICD-10-CM | POA: Diagnosis not present

## 2023-10-27 DIAGNOSIS — I340 Nonrheumatic mitral (valve) insufficiency: Secondary | ICD-10-CM

## 2023-10-27 DIAGNOSIS — R739 Hyperglycemia, unspecified: Secondary | ICD-10-CM | POA: Diagnosis not present

## 2023-10-27 DIAGNOSIS — F418 Other specified anxiety disorders: Secondary | ICD-10-CM | POA: Diagnosis not present

## 2023-10-27 DIAGNOSIS — N3945 Continuous leakage: Secondary | ICD-10-CM

## 2023-10-27 DIAGNOSIS — G479 Sleep disorder, unspecified: Secondary | ICD-10-CM | POA: Diagnosis not present

## 2023-10-27 DIAGNOSIS — Z0000 Encounter for general adult medical examination without abnormal findings: Secondary | ICD-10-CM

## 2023-10-27 DIAGNOSIS — N1831 Chronic kidney disease, stage 3a: Secondary | ICD-10-CM | POA: Diagnosis not present

## 2023-10-27 DIAGNOSIS — E038 Other specified hypothyroidism: Secondary | ICD-10-CM

## 2023-10-27 LAB — CBC
HCT: 39.5 % (ref 36.0–46.0)
Hemoglobin: 13.5 g/dL (ref 12.0–15.0)
MCHC: 34.2 g/dL (ref 30.0–36.0)
MCV: 93.3 fl (ref 78.0–100.0)
Platelets: 217 K/uL (ref 150.0–400.0)
RBC: 4.23 Mil/uL (ref 3.87–5.11)
RDW: 11.9 % (ref 11.5–15.5)
WBC: 5.4 K/uL (ref 4.0–10.5)

## 2023-10-27 LAB — LIPID PANEL
Cholesterol: 205 mg/dL — ABNORMAL HIGH (ref 0–200)
HDL: 96.7 mg/dL (ref >39.00)
LDL Cholesterol: 91 mg/dL (ref 0–99)
NonHDL: 108.09
Total CHOL/HDL Ratio: 2
Triglycerides: 84 mg/dL (ref 0.0–149.0)
VLDL: 16.8 mg/dL (ref 0.0–40.0)

## 2023-10-27 LAB — COMPREHENSIVE METABOLIC PANEL WITH GFR
ALT: 15 U/L (ref 0–35)
AST: 20 U/L (ref 0–37)
Albumin: 4 g/dL (ref 3.5–5.2)
Alkaline Phosphatase: 58 U/L (ref 39–117)
BUN: 19 mg/dL (ref 6–23)
CO2: 28 meq/L (ref 19–32)
Calcium: 9.6 mg/dL (ref 8.4–10.5)
Chloride: 102 meq/L (ref 96–112)
Creatinine, Ser: 0.97 mg/dL (ref 0.40–1.20)
GFR: 56.77 mL/min — ABNORMAL LOW (ref >60.00)
Glucose, Bld: 116 mg/dL — ABNORMAL HIGH (ref 70–99)
Potassium: 3.7 meq/L (ref 3.5–5.1)
Sodium: 138 meq/L (ref 135–145)
Total Bilirubin: 0.6 mg/dL (ref 0.2–1.2)
Total Protein: 6.7 g/dL (ref 6.0–8.3)

## 2023-10-27 LAB — VITAMIN B12: Vitamin B-12: 371 pg/mL (ref 211–911)

## 2023-10-27 LAB — HEMOGLOBIN A1C: Hgb A1c MFr Bld: 5.8 % (ref 4.6–6.5)

## 2023-10-27 LAB — VITAMIN D 25 HYDROXY (VIT D DEFICIENCY, FRACTURES): VITD: 62.22 ng/mL (ref 30.00–100.00)

## 2023-10-27 LAB — TSH: TSH: 1.3 u[IU]/mL (ref 0.35–5.50)

## 2023-10-27 NOTE — Assessment & Plan Note (Signed)
 Chronic Dexa up to date Walking daily Taking calcium and vitamin d  daily Check vitamin d  daily

## 2023-10-27 NOTE — Assessment & Plan Note (Addendum)
 Chronic Situational-intermittent anxiety Continue lorazepam  0.5 mg twice daily as needed Advsied trying to keep use to a minimum

## 2023-10-27 NOTE — Assessment & Plan Note (Signed)
Mild hyperglycemia in the past Check A1c 

## 2023-10-27 NOTE — Assessment & Plan Note (Signed)
 Chronic Echocardiogram 2014 showed mitral valve leaflets with thickened-myxomatous appearance-mild bileaflet prolapse, mitral regurgitation is probably moderate-severe No significant murmur on exam Echocardiogram ordered Asymptomatic

## 2023-10-27 NOTE — Assessment & Plan Note (Signed)
 Chronic Mild Has been fairly stable for years - low risk for progression Unsure of cause She only takes Tylenol  and will continue to avoid NSAIDs She does have good water intake Blood pressure well-controlled-will discontinue hydrochlorothiazide  Has seen nephrology - not sure if she truly has CKD Cmp, cbc

## 2023-10-27 NOTE — Assessment & Plan Note (Signed)
 Chronic BP well controlled - on low side Given mild decreased GFR will try stopping hydrochlorothiazide  12.5 mg daily Continue metoprolol  XL 25 mg daily Monitor BP at home -  if goes over 130/80 will restart hydrochlorothiazide 

## 2023-10-27 NOTE — Assessment & Plan Note (Signed)
 Chronic Has been able to fall asleep, but wakes up and overall is not getting great sleep Currently taking trazodone  50 mg nightly, but not every night She often wakes up after 2-3 hours of sleeping and has difficulty getting back to sleep Advised taking the medication on a nightly basis-hopefully after getting consistently good sleep that will help overall and she may not require the medication on a nightly basis

## 2023-10-27 NOTE — Assessment & Plan Note (Signed)
 Chronic  Clinically euthyroid Continue levothyroxine  75 mcg 6 days a week Check tsh  Titrate med dose if needed

## 2023-10-27 NOTE — Assessment & Plan Note (Signed)
 Chronic Taking B12 1000 mcg daily Check level

## 2023-10-27 NOTE — Assessment & Plan Note (Signed)
 Chronic Doing well with Gemtesa  - it has helped

## 2023-10-28 ENCOUNTER — Telehealth: Payer: Self-pay | Admitting: Radiology

## 2023-10-28 NOTE — Telephone Encounter (Signed)
 Copied from CRM #[id number]. Topic: General - Other >> Oct 28, 2023  9:51 AM Mercedes MATSU wrote: Reason for CRM: Patient called in stating that she had an appointment with Dr. Geofm and she misplaced her apple watch. She wanted to make sure if she had dropped it in office. Patient is requesting a call back at [phone]. Patient is also asking to ask the lab tech and to give her a cb so she knows its there or not.

## 2023-10-29 ENCOUNTER — Telehealth: Payer: Self-pay | Admitting: Radiology

## 2023-10-29 DIAGNOSIS — M542 Cervicalgia: Secondary | ICD-10-CM | POA: Diagnosis not present

## 2023-10-29 DIAGNOSIS — M5126 Other intervertebral disc displacement, lumbar region: Secondary | ICD-10-CM | POA: Diagnosis not present

## 2023-10-29 NOTE — Telephone Encounter (Signed)
 Message was left for patient that watch was not found.

## 2023-10-29 NOTE — Telephone Encounter (Signed)
 Copied from CRM [id number]. Topic: General - Other >> Oct 29, 2023  1:35 PM Harlene ORN wrote: Reason for CRM: patient lost her Apple Watch. Checking if the Practice has it. Was last seen in the lab.

## 2023-10-29 NOTE — Telephone Encounter (Signed)
 Called and left message for patient today.  No apple watch found and lab confirmed they had not seen it either.

## 2023-11-09 NOTE — Progress Notes (Signed)
 Assessment/Plan:     Renee Santos is a very pleasant 78 y.o. year old RH female with a history of hypertension, hyperlipidemia, hypothyroidism, situational anxiety, prediabetes, CKD, B12 deficiency, seen today for evaluation of memory loss. MoCA today is 20/30 .  Etiology is unclear, although there is concern for neurodegenerative disease.  She has strong family history of dementia.  Workup is in progress.  Mood is anxious, tearful as she is very concerned about the possibility of Alzheimer's disease.  Patient is able to participate on ADLs.    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   Continue to replenish B12 (371) Recommend good control of cardiovascular risk factors.   Continue to control mood as per PCP, recommend to consider CBT for situational anxiety.  Patient to discuss starting antidepressant under PCP supervision. Folllow up pending on the above results.   Subjective:    The patient is accompanied by her husband who supplements  the history.    How long did patient have memory difficulties?  For about 6 months, maybe longer. I have been always a note taker and it has gotten fuzzier, especially with names .  She also has some difficulty with new information, recent conversations.  LTM is good.  repeats oneself?  Endorsed, for the last 6 months.  Disoriented when walking into a room? Denies    Leaving objects in unusual places?  Denies.   Wandering behavior? Denies.   Any personality changes, or depression, anxiety? Situational anxiety, I am more short tempered, she was taking Ativan  but cannot tell the difference  so I have not been taking it .  She worries a lot even at night, keeping me away .  Hallucinations or paranoia? Denies.   Seizures? Denies.    Any sleep changes?  Does not sleep well.  I think about my memory open during the night .  Denies frequent nightmares or dream reenactment, other REM behavior or sleepwalking   Sleep apnea? Denies.   Any hygiene concerns?  Denies.   Independent of bathing and dressing? Endorsed  Does the patient need help with medications? Patient is in charge   Who is in charge of the finances?  Husband is in charge     Any changes in appetite?   She does not eat well-husband says   Patient have trouble swallowing?  Denies.   Any headaches?  Denies.   Chronic pain? Denies.   Ambulates with difficulty? Denies. Walks 6 miles in the park with her friends. Recent falls or head injuries? Denies.     Vision changes?  Denies any new issues.   Any strokelike symptoms? Denies.   Any tremors? Denies.  Any anosmia? Denies.   Any incontinence of urine? Has OAB controlled with Gemtesa . Any bowel dysfunction? Denies.      Patient lives at [place] [facility] [place] 1 year ago with her husband.    History of heavy alcohol intake? Denies.  History of heavy tobacco use? Denies.   Family history of dementia?  Mother, maternal cousin, maternal grandmother and aunt had dementia ? Type. Does patient drive? Yes, she admits to having made the wrong turn a couple of times. Retired [employer].  College degree. Pertinent labs 10/2023: TSH  1.30, TC 205, nl CBC, A1C 5.8, vit D 62, B12 317   No Known Allergies  Current Outpatient Medications  Medication Instructions   alendronate  (FOSAMAX ) 70 mg, Oral, Every 7 days, Take with a full glass of water on an empty stomach.   BIOTIN FORTE PO 1 tablet   Calcium Carbonate-Vit D-Min (CALTRATE PLUS PO) Daily   clobetasol  cream (TEMOVATE ) 0.05 % 1 application , Topical, 2 times daily   Cyanocobalamin  (VITAMIN B-12 PO) Take by mouth.   Gemtesa  75 mg, Oral,  Daily   hydrochlorothiazide  (HYDRODIURIL ) 12.5 mg, Every morning   levothyroxine  (SYNTHROID ) 75 MCG tablet Take 1 tablet by mouth 6 days a week   LORazepam  (ATIVAN ) 0.5 mg, Oral, 2 times daily PRN, for anxiety   metoprolol  succinate (TOPROL -XL) 50 MG 24 hr tablet Take 1/2 tablet by mouth every day   multivitamin-lutein (OCUVITE-LUTEIN) CAPS capsule 1 capsule, Daily   traZODone  (DESYREL ) 50 mg, Oral, Daily at bedtime   valACYclovir (VALTREX) 1,000 mg, Daily     VITALS:   Vitals:   11/10/23 1336  BP: 127/77  Pulse: 71  SpO2: 99%  Weight: 98 lb (44.5 kg)  Height: 5' 1.5 (1.562 m)     Physical Exam  :    11/10/2023    4:00 PM  Montreal Cognitive Assessment   Visuospatial/ Executive (0/5) 2  Naming (0/3) 3  Attention: Read list of digits (0/2) 2  Attention: Read list of letters (0/1) 1  Attention: Serial 7 subtraction starting at 100 (0/3) 3  Language: Repeat phrase (0/2) 1  Language : Fluency (0/1) 1  Abstraction (0/2) 1  Delayed Recall (0/5) 0  Orientation (0/6) 6  Total 20  Adjusted Score (based on education) 20       12/24/2016    8:18 AM  MMSE - Mini Mental State Exam  Orientation to time 5   Orientation to Place 5   Registration 3   Attention/ Calculation 3   Recall 2   Language- name 2 objects 2   Language- repeat 1  Language- follow 3 step command 3   Language- read & follow direction 1   Write a sentence 1   Copy design 1   Total score 27      Data saved with a previous flowsheet row definition       HEENT:  Normocephalic, atraumatic.  The superficial temporal arteries are without ropiness or tenderness. Cardiovascular: Regular rate and rhythm. Lungs: Clear to auscultation bilaterally. Neck: There are no carotid bruits noted bilaterally. Orientation:  Alert and oriented to person, place and to time. No aphasia or dysarthria. Fund of knowledge is appropriate. Recent and remote memory impaired.  Attention and concentration are reduced.  Able to name  objects and repeat phrases 1/2.  Delayed recall 0/5 Cranial nerves: There is good facial symmetry. Extraocular muscles are intact and visual fields are full to confrontational testing. Speech is fluent and clear. No tongue deviation. Hearing is intact to conversational tone. Tone: Tone is good throughout. Sensation: Sensation is intact to light touch.  Vibration is intact at the bilateral big toe.  Coordination: The patient has no difficulty with RAM's or FNF bilaterally. Normal finger to nose  Motor: Strength is 5/5 in the bilateral upper and lower extremities. There is no pronator drift. There are no fasciculations noted. DTR's: Deep tendon reflexes are 2/4 bilaterally. Gait and Station: The patient is able to ambulate without difficulty. Gait is cautious and narrow. Stride length is normal.  Thank you for allowing us  the opportunity to participate in the care of this nice patient. Please do not hesitate to contact us  for any questions or concerns.   Total time spent on today's visit was 52 minutes dedicated to this patient today, preparing to see patient, examining the patient, ordering tests and/or medications and counseling the patient, documenting clinical information in the EHR or other health record, independently interpreting results and communicating results to the patient/family, discussing treatment and goals, answering patient's questions and coordinating care.  Cc:  Geofm Glade PARAS, MD  Camie Sevin 11/10/2023 4:57 PM

## 2023-11-10 ENCOUNTER — Ambulatory Visit (INDEPENDENT_AMBULATORY_CARE_PROVIDER_SITE_OTHER): Admitting: Physician Assistant

## 2023-11-10 ENCOUNTER — Encounter: Payer: Self-pay | Admitting: Physician Assistant

## 2023-11-10 ENCOUNTER — Ambulatory Visit

## 2023-11-10 VITALS — BP 127/77 | HR 71 | Ht 61.5 in | Wt 98.0 lb

## 2023-11-10 DIAGNOSIS — R413 Other amnesia: Secondary | ICD-10-CM

## 2023-11-10 NOTE — Patient Instructions (Addendum)
 It was a pleasure to see you today at our office.   Recommendations:  Neurocognitive evaluation at our office   MRI of the brain, the radiology office will call you to arrange you appointment  [phone] Continue B12 supplement    [email or web address]   RECOMMENDATIONS FOR ALL PATIENTS WITH MEMORY PROBLEMS: 1. Continue to exercise (Recommend 30 minutes of walking everyday, or 3 hours every week) 2. Increase social interactions - continue going to [place] and enjoy social gatherings with friends and family 3. Eat healthy, avoid fried foods and eat more fruits and vegetables 4. Maintain adequate blood pressure, blood sugar, and blood cholesterol level. Reducing the risk of stroke and cardiovascular disease also helps promoting better memory. 5. Avoid stressful situations. Live a simple life and avoid aggravations. Organize your time and prepare for the next day in anticipation. 6. Sleep well, avoid any interruptions of sleep and avoid any distractions in the bedroom that may interfere with adequate sleep quality 7. Avoid sugar, avoid sweets as there is a strong link between excessive sugar intake, diabetes, and cognitive impairment We discussed the Mediterranean diet, which has been shown to help patients reduce the risk of progressive memory disorders and reduces cardiovascular risk. This includes eating fish, eat fruits and green leafy vegetables, nuts like almonds and hazelnuts, walnuts, and also use olive oil. Avoid fast foods and fried foods as much as possible. Avoid sweets and sugar as sugar use has been linked to worsening of memory function.  There is always a concern of gradual progression of memory problems. If this is the case, then we may need to adjust level of care according to patient needs. Support, both to the patient and caregiver, should then be put into place.      You have been referred for a neuropsychological  evaluation (i.e., evaluation of memory and thinking abilities). Please bring someone with you to this appointment if possible, as it is helpful for the doctor to hear from both you and another adult who knows you well. Please bring eyeglasses and hearing aids if you wear them.    The evaluation will take approximately 3 hours and has two parts:   The first part is a clinical interview with the neuropsychologist (Dr. Richie or Dr. Gayland). During the interview, the neuropsychologist will speak with you and the individual you brought to the appointment.    The second part of the evaluation is testing with the doctor's technician Neal or Luke). During the testing, the technician will ask you to remember different types of material, solve problems, and answer some questionnaires. Your family member will not be present for this portion of the evaluation.   Please note: We must reserve several hours of the neuropsychologist's time and the psychometrician's time for your evaluation appointment. As such, there is a No-Show fee of $100. If you are unable to attend any of your appointments, please contact our office as soon as possible to reschedule.      DRIVING: Regarding driving, in patients with progressive memory problems, driving will be impaired. We advise to have someone else do the driving if trouble finding directions or if minor accidents are reported. Independent driving assessment is available to determine safety of driving.   If you are interested in the driving assessment, you can contact the following:  [facility] in [place] [phone]  [facility] [phone]  [facility] [phone]  [facility] [phone] or [phone]   FALL PRECAUTIONS: Be cautious when walking.  Scan the area for obstacles that may increase the risk of trips and falls. When getting up in the mornings, sit up at the edge of the bed for a few minutes before  getting out of bed. Consider elevating the bed at the head end to avoid drop of blood pressure when getting up. Walk always in a well-lit room (use night lights in the walls). Avoid area rugs or power cords from appliances in the middle of the walkways. Use a walker or a cane if necessary and consider physical therapy for balance exercise. Get your eyesight checked regularly.  FINANCIAL OVERSIGHT: Supervision, especially oversight when making financial decisions or transactions is also recommended.  HOME SAFETY: Consider the safety of the kitchen when operating appliances like stoves, microwave oven, and blender. Consider having supervision and share cooking responsibilities until no longer able to participate in those. Accidents with firearms and other hazards in the house should be identified and addressed as well.   ABILITY TO BE LEFT ALONE: If patient is unable to contact 911 operator, consider using LifeLine, or when the need is there, arrange for someone to stay with patients. Smoking is a fire hazard, consider supervision or cessation. Risk of wandering should be assessed by caregiver and if detected at any point, supervision and safe proof recommendations should be instituted.  MEDICATION SUPERVISION: Inability to self-administer medication needs to be constantly addressed. Implement a mechanism to ensure safe administration of the medications.      Mediterranean Diet A Mediterranean diet refers to food and lifestyle choices that are based on the traditions of countries located on the [place]. This way of eating has been shown to help prevent certain conditions and improve outcomes for people who have chronic diseases, like kidney disease and heart disease. What are tips for following this plan? Lifestyle  Cook and eat meals together with your family, when possible. Drink enough fluid to keep your urine clear or pale yellow. Be physically active every day. This  includes: Aerobic exercise like running or swimming. Leisure activities like gardening, walking, or housework. Get 7-8 hours of sleep each night. If recommended by your health care provider, drink red wine in moderation. This means 1 glass a day for nonpregnant women and 2 glasses a day for men. A glass of wine equals 5 oz (150 mL). Reading food labels  Check the serving size of packaged foods. For foods such as rice and pasta, the serving size refers to the amount of cooked product, not dry. Check the total fat in packaged foods. Avoid foods that have saturated fat or trans fats. Check the ingredients list for added sugars, such as corn syrup. Shopping  At the grocery store, buy most of your food from the areas near the walls of the store. This includes: Fresh fruits and vegetables (produce). Grains, beans, nuts, and seeds. Some of these may be available in unpackaged forms or large amounts (in bulk). Fresh seafood. Poultry and eggs. Low-fat dairy products. Buy whole ingredients instead of prepackaged foods. Buy fresh fruits and vegetables in-season from local farmers markets. Buy frozen fruits and vegetables in resealable bags. If you do not have access to quality fresh seafood, buy precooked frozen shrimp or canned fish, such as tuna, salmon, or sardines. Buy small amounts of raw or cooked vegetables, salads, or olives from the deli or salad bar at your store. Stock your pantry so you always have certain foods on hand, such as olive oil, canned tuna, canned tomatoes, rice, pasta,  and beans. Cooking  Cook foods with extra-virgin olive oil instead of using butter or other vegetable oils. Have meat as a side dish, and have vegetables or grains as your main dish. This means having meat in small portions or adding small amounts of meat to foods like pasta or stew. Use beans or vegetables instead of meat in common dishes like chili or lasagna. Experiment with different cooking methods. Try  roasting or broiling vegetables instead of steaming or sauteing them. Add frozen vegetables to soups, stews, pasta, or rice. Add nuts or seeds for added healthy fat at each meal. You can add these to yogurt, salads, or vegetable dishes. Marinate fish or vegetables using olive oil, lemon juice, garlic, and fresh herbs. Meal planning  Plan to eat 1 vegetarian meal one day each week. Try to work up to 2 vegetarian meals, if possible. Eat seafood 2 or more times a week. Have healthy snacks readily available, such as: Vegetable sticks with hummus. Greek yogurt. Fruit and nut trail mix. Eat balanced meals throughout the week. This includes: Fruit: 2-3 servings a day Vegetables: 4-5 servings a day Low-fat dairy: 2 servings a day Fish, poultry, or lean meat: 1 serving a day Beans and legumes: 2 or more servings a week Nuts and seeds: 1-2 servings a day Whole grains: 6-8 servings a day Extra-virgin olive oil: 3-4 servings a day Limit red meat and sweets to only a few servings a month What are my food choices? Mediterranean diet Recommended Grains: Whole-grain pasta. Brown rice. Bulgar wheat. Polenta. Couscous. Whole-wheat bread. [place] Made[place]. Vegetables: Artichokes. Beets. Broccoli. Cabbage. Carrots. Eggplant. Green beans. Chard. Kale. Spinach. Onions. Leeks. Peas. Squash. Tomatoes. Peppers. Radishes. Fruits: Apples. Apricots. Avocado. Berries. Bananas. Cherries. Dates. Figs. Grapes. Lemons. Melon. Oranges. Peaches. Plums. Pomegranate. Meats and other protein foods: Beans. Almonds. Sunflower seeds. Pine nuts. Peanuts. Cod. Salmon. Scallops. Shrimp. Tuna. Tilapia. Clams. Oysters. Eggs. Dairy: Low-fat milk. Cheese. Greek yogurt. Beverages: Water. Red wine. Herbal tea. Fats and oils: Extra virgin olive oil. Avocado oil. Grape seed oil. Sweets and desserts: [place] yogurt with honey. Baked apples. Poached pears. Trail mix. Seasoning and other foods: Basil. Cilantro. Coriander. Cumin. Mint.  Parsley. Sage. Rosemary. Tarragon. Garlic. Oregano. Thyme. Pepper. Balsalmic vinegar. Tahini. Hummus. Tomato sauce. Olives. Mushrooms. Limit these Grains: Prepackaged pasta or rice dishes. Prepackaged cereal with added sugar. Vegetables: Deep fried potatoes (french fries). Fruits: Fruit canned in syrup. Meats and other protein foods: Beef. Pork. Lamb. Poultry with skin. Hot dogs. [place]. Dairy: Ice cream. Sour cream. Whole milk. Beverages: Juice. Sugar-sweetened soft drinks. Beer. Liquor and spirits. Fats and oils: Butter. Canola oil. Vegetable oil. Beef fat (tallow). Lard. Sweets and desserts: Cookies. Cakes. Pies. Candy. Seasoning and other foods: Mayonnaise. Premade sauces and marinades. The items listed may not be a complete list. Talk with your dietitian about what dietary choices are right for you. Summary The Mediterranean diet includes both food and lifestyle choices. Eat a variety of fresh fruits and vegetables, beans, nuts, seeds, and whole grains. Limit the amount of red meat and sweets that you eat. Talk with your health care provider about whether it is safe for you to drink red wine in moderation. This means 1 glass a day for nonpregnant women and 2 glasses a day for men. A glass of wine equals 5 oz (150 mL). This information is not intended to replace advice given to you by your health care provider. Make sure you discuss any questions you have with your health care provider.  Document Released: 09/21/2015 Document Revised: 10/24/2015 Document Reviewed: 09/21/2015 Elsevier Interactive Patient Education  2017 [employer].

## 2023-11-11 ENCOUNTER — Telehealth: Payer: Self-pay | Admitting: Radiology

## 2023-11-11 MED ORDER — DIAZEPAM 5 MG PO TABS: ORAL_TABLET | ORAL | 0 refills | Status: DC

## 2023-11-11 NOTE — Addendum Note (Signed)
 Addended by: GEOFM GLADE PARAS on: 11/11/2023 01:15 PM   Modules accepted: Orders

## 2023-11-11 NOTE — Telephone Encounter (Signed)
 Spoke with patient today.

## 2023-11-11 NOTE — Telephone Encounter (Signed)
 Valium sent in -- she should not take lorazepam  the day she takes the valium

## 2023-11-11 NOTE — Telephone Encounter (Signed)
 Copied from CRM [id number]. Topic: Clinical - Medication Question >> Nov 11, 2023 10:13 AM Drema MATSU wrote: Reason for CRM: Patient is needing claustrophobic medication for her MRI. MRI is scheduled for 11/17/23. She wants it to go to [facility] on [address].

## 2023-11-17 ENCOUNTER — Ambulatory Visit
Admission: RE | Admit: 2023-11-17 | Discharge: 2023-11-17 | Disposition: A | Source: Ambulatory Visit | Attending: Physician Assistant | Admitting: Physician Assistant

## 2023-11-17 DIAGNOSIS — R413 Other amnesia: Secondary | ICD-10-CM | POA: Diagnosis not present

## 2023-11-19 ENCOUNTER — Ambulatory Visit: Payer: Self-pay

## 2023-11-19 DIAGNOSIS — M542 Cervicalgia: Secondary | ICD-10-CM | POA: Diagnosis not present

## 2023-11-19 DIAGNOSIS — M5126 Other intervertebral disc displacement, lumbar region: Secondary | ICD-10-CM | POA: Diagnosis not present

## 2023-11-20 NOTE — Progress Notes (Signed)
 I left message to call office to discuss MRI at 4:02pm 11/20/2023

## 2023-11-22 DIAGNOSIS — H6121 Impacted cerumen, right ear: Secondary | ICD-10-CM | POA: Diagnosis not present

## 2023-11-22 NOTE — ED Provider Notes (Signed)
 EMERGENCY DEPARTMENT ENCOUNTER     CHIEF COMPLAINT   Chief Complaint  Patient presents with  . Otalgia    Right earache     HPI   Renee Santos is a 78 y.o. female who presents to the emergency room complaining of right ear ache x 24 hours.  Denies fevers or ear drainage.  Patient states that she feels like her hearing is muffled out of her right ear.  Reports having a history of cerumen impaction.  Patient use peroxide drops prior to arrival.    ROS otherwise negative  PAST MEDICAL HISTORY   History reviewed. No pertinent past medical history.  SURGICAL HISTORY   History reviewed. No pertinent surgical history.  CURRENT MEDICATIONS   Current Medications[1]  ALLERGIES   Allergies[2]  FAMILY HISTORY   No family history on file.  SOCIAL HISTORY   Social History[3]   PHYSICAL EXAM   VITAL SIGNS: BP (!) 155/67   Pulse 67   Temp 98.5 F (36.9 C)   Resp 18   Ht 1.575 m (5' 2)   Wt 45.4 kg (100 lb)   SpO2 100%   BMI 18.29 kg/m   Constitutional:  Well developed, well nourished, no acute distress, non-toxic appearance.  HENT:  + Right ear with mild cerumen impaction, after removal, tympanic membrane without erythema and visible cone of light, left ear normal.  Normocephalic, atraumatic, oropharynx moist Neck:  Normal range of motion, supple, no stridor.  Eyes:  conjunctiva normal, no discharge.  Respiratory:  Equal chest rise, no respiratory distress. Extremities:  Intact distal pulses, no edema. No major deformities noted.  Skin:  Warm, dry, no erythema, no rash.  Neurologic:  Awake and alert, gross normal cognition.  Moving all extremities. Psychiatric:  Affect normal, judgment normal, mood normal.       MEDICAL DECISION MAKING and ED COURSE:   Nursing Notes and Vital Signs Reviewed. External chart review performed. Pertinent labs & imaging studies independently reviewed. (See chart for details and documented ED course) History obtained by  patient  Renee Santos is a 78 y.o. female who presented to the Emergency Department c/o right ear ache x 24 hours.  Denies fevers or ear drainage.  Patient states that she feels like her hearing is muffled out of her right ear.  Reports having a history of cerumen impaction.  Patient use peroxide drops prior to arriva    I questioned the following diagnoses not limited to but including cerumen impaction, otitis media, otitis externa [name] just  VITAL SIGNS: BP (!) 155/67   Pulse 67   Temp 98.5 F (36.9 C)   Resp 18   Ht 1.575 m (5' 2)   Wt 45.4 kg (100 lb)   SpO2 100%   BMI 18.29 kg/m  .Pulse ox interpretation:  The pulse oximeter revealed 99% on room air which is not hypoxic and normal for the patient as interpreted by me.  + Right ear with mild cerumen impaction, after manual removal, tympanic membrane without erythema and visible cone of light, left ear normal.  Patient states she feels significantly better and her hearing is back to normal.  FINAL IMPRESSION: Impaction cerumen left right ear  PRESCIRPTIONS GIVEN: None Unless otherwise stated in the chart or patient is admitted as elsewhere documented, any previously prescribed medications will be maintained.   FOLLOW UP PRECAUTIONS Please return to the emergency room for new or worsening symptoms otherwise, please follow-up with your primary care provider within 72 hours  for reassessment of symptoms.  I considered admission/observation for this patient however patient was stable to safely discharge home with close outpatient follow-up.  I have discussed with the patient their diagnosis/differential diagnosis, Emergency Department workup and results, and disposition; answered any questions patient has posed.  I have discussed with the patient their outpatient treatment plan, follow-up plan, and have given them Emergency Department return precautions.  Patient discharged home in stable condition. Vital signs stable at  discharge. Socioeconomic factors were also considered as part of the decision-making process.     DISPOSITION:  Discharge         [1] No current facility-administered medications for this encounter.   No current outpatient medications on file.  [2] No Known Allergies [3] Social History Socioeconomic History  . Marital status: Married   [place], [place] 11/22/23 1819

## 2023-11-25 ENCOUNTER — Ambulatory Visit (INDEPENDENT_AMBULATORY_CARE_PROVIDER_SITE_OTHER)

## 2023-11-25 DIAGNOSIS — I059 Rheumatic mitral valve disease, unspecified: Secondary | ICD-10-CM | POA: Diagnosis not present

## 2023-11-25 LAB — ECHOCARDIOGRAM COMPLETE
Area-P 1/2: 2.66 cm2
MV M vel: 6.26 m/s
MV Peak grad: 156.8 mmHg
Radius: 0.6 cm
S' Lateral: 2.43 cm

## 2023-11-28 ENCOUNTER — Encounter: Payer: Self-pay | Admitting: Internal Medicine

## 2023-11-28 DIAGNOSIS — I340 Nonrheumatic mitral (valve) insufficiency: Secondary | ICD-10-CM | POA: Insufficient documentation

## 2023-12-02 ENCOUNTER — Other Ambulatory Visit: Payer: Self-pay

## 2023-12-02 DIAGNOSIS — N3281 Overactive bladder: Secondary | ICD-10-CM

## 2023-12-02 NOTE — Telephone Encounter (Signed)
 Med refill request: Gemtesa   Last AEX: 06/27/23 Next AEX: 06/29/24 Last MMG (if hormonal med) Refill authorized: Patient left message in refill line stating she needs a refill and pharmacy told her to contact us . last rx 09/29/23 #30 with 2 refills. Please advise

## 2023-12-03 MED ORDER — GEMTESA 75 MG PO TABS: 1.0000 | ORAL_TABLET | Freq: Every day | ORAL | 5 refills | Status: AC

## 2023-12-11 DIAGNOSIS — M542 Cervicalgia: Secondary | ICD-10-CM | POA: Diagnosis not present

## 2023-12-11 DIAGNOSIS — M5126 Other intervertebral disc displacement, lumbar region: Secondary | ICD-10-CM | POA: Diagnosis not present

## 2023-12-22 ENCOUNTER — Encounter: Payer: Self-pay | Admitting: Internal Medicine

## 2024-01-02 ENCOUNTER — Ambulatory Visit: Payer: Self-pay | Admitting: Psychology

## 2024-01-02 ENCOUNTER — Ambulatory Visit: Admitting: Psychology

## 2024-01-02 DIAGNOSIS — R4189 Other symptoms and signs involving cognitive functions and awareness: Secondary | ICD-10-CM

## 2024-01-02 DIAGNOSIS — F0670 Mild neurocognitive disorder due to known physiological condition without behavioral disturbance: Secondary | ICD-10-CM

## 2024-01-02 NOTE — Progress Notes (Signed)
   Psychometrician Note   Cognitive testing was administered to [facility] by Lonell Jude, B.S. (psychometrist) under the supervision of Dr. Renda Beckwith, Psy.D., licensed psychologist on 01/02/2024. Ms. Germond did not appear overtly distressed by the testing session per behavioral observation or responses across self-report questionnaires. Rest breaks were offered.   The battery of tests administered was selected by Dr. Renda Beckwith, Psy.D. with consideration to Ms. Minervini's current level of functioning, the nature of her symptoms, emotional and behavioral responses during interview, level of literacy, observed level of motivation/effort, and the nature of the referral question. This battery was communicated to the psychometrist. Communication between Dr. Renda Beckwith, Psy.D. and the psychometrist was ongoing throughout the evaluation and Dr. Renda Beckwith, Psy.D. was immediately accessible at all times. Dr. Renda Beckwith, Psy.D. provided supervision to the psychometrist on the date of this service to the extent necessary to assure the quality of all services provided.    Hudsyn Barich Ribaudo will return within approximately 1-2 weeks for an interactive feedback session with Dr. Beckwith at which time her test performances, clinical impressions, and treatment recommendations will be reviewed in detail. Ms. Breth understands she can contact our office should she require our assistance before this time.  A total of 110 minutes of billable time were spent face-to-face with Ms. Leeson by the psychometrist. This includes both test administration and scoring time. Billing for these services is reflected in the clinical report generated by Dr. Renda Beckwith, Psy.D.  This note reflects time spent with the psychometrician and does not include test scores or any clinical interpretations made by Dr. Beckwith. The full report will follow in a separate note.

## 2024-01-02 NOTE — Progress Notes (Addendum)
 NEUROPSYCHOLOGICAL EVALUATION Renee Santos. [facility]  [place] Department of Neurology  Date of Evaluation: 01/02/2024  REASON FOR REFERRAL   Renee Santos is a 78 year old, right-handed, White female with 16 years of formal education. She was referred for neuropsychological evaluation by Renee Sevin, PA-C, to assess current neurocognitive functioning, document potential cognitive deficits, and assist with treatment planning. This is her first neuropsychological evaluation.  SUMMARY OF RESULTS   Premorbid cognitive abilities are estimated to be in the average range based on word reading and sociodemographic factors. Relative to this baseline estimate, current performance was below expectations across learning/memory tasks, as well as on a single measure of response inhibition when a set-shifting component was introduced.  Regarding learning/memory, she demonstrated markedly poor retention regardless of initial recall performance. On three of four tasks (i.e., word list, one of two short stories, shapes), retention was 0%. She benefited to some degree from recognition cues on verbal memory tasks, though she still made a few false-positive errors on the word list. In contrast, she did not benefit much from recognition cues on the shapes task, but this may have been due to very low initial learning, as her performance on this task was the lowest of all memory tasks.  Remaining measures of working memory, processing speed, executive functioning, language, and visuospatial abilities were within age-related expectations.  On self-report questionnaires, she did not report significant symptoms of depression or anxiety.  DIAGNOSTIC IMPRESSION   Results of the current evaluation indicated deficits largely isolated to learning/memory. In the setting of preserved functional independence, findings support a diagnosis of mild neurocognitive disorder (mild cognitive impairment).   The clinical  presentation raises concern for a possible underlying neurodegenerative process, such as Alzheimer's disease, especially given the observed memory impairment. Delayed recall was notable for forgetting, with retention often dropping to 0%, and recognition cues did not always improve performance. Should these findings ultimately prove to indicate a neurodegenerative process, the fact that deficits are currently limited to memory would suggest an early stage of such a process. Further evaluation with blood-based biomarkers, cerebrospinal fluid analysis, or amyloid imaging may aid diagnostic clarification. While anxiety, sleep disturbance, and mild cerebrovascular changes may exacerbate cognitive difficulties, the degree of memory impairment is not necessarily characteristic of these factors alone.  Serial cognitive assessment will be important to monitor progression and better clarify the underlying etiology.  ICD-10 Codes: F06.70 Mild neurocognitive disorder (mild cognitive impairment)   RECOMMENDATIONS   A repeat neuropsychological evaluation in 12-18 months (or sooner if functional decline is noted) is recommended.  Discuss with Renee Santos the risks and benefits of starting a medication that can help slow memory decline.  You are encouraged to reduce your alcohol consumption given the implications to your overall health, particularly in the context of cognitive impairment and vascular risk factors.  Consider involving a trusted family member in periodic oversight of instrumental activities of daily living. They can step in if difficulties arise, while still allowing you to manage independently when your functioning remains intact.  Continue treatment for anxiety, especially as emotional distress can exacerbate cognitive difficulties. Discuss current medication regimen with your prescribing provider to ensure you are receiving maximum benefit. If symptoms begin to interfere with daily functioning,  you may wish to reconsider exploring additional treatment options, such as mindfulness, relaxation techniques, or counseling.  Given ongoing sleep difficulties, she may benefit from the implementation of sleep hygiene techniques, including:  Go to bed and get up at the same time each  day to help your body establish a regular rhythm. Establish and maintain a bedtime routine. Certain activities such as stretching, meditating, listening to soft music, or reading ~15 minutes before bedtime can be a great way to regularly get your brain and body ready for sleep. Avoid taking naps during the day. Avoid alcohol and caffeine for 5 or 6 hours before going to bed. Get regular exercise, but not in the hours before bedtime. Use comfortable bedding and maintain a cool temperature in your bedroom. Block out light and distracting noise. Avoid watching television or using your phone/computer in bed. Avoid staying in bed if you have difficulty falling asleep. If you have not been able to get to sleep after about 20 minutes or more, get up and do something calming or boring until you feel sleepy, then return to bed and try again.  Prioritize physical health through diet, exercise, and sleep. Regular physical activity supports cardiovascular health, improves mood, and helps preserve mobility and independence. Aim for at least 150 minutes of moderate aerobic exercise per week (e.g., brisk walking, swimming, gardening). A brain-healthy diet such as the Mediterranean or MIND diet is rich in fruits, vegetables, whole grains, healthy fats, and lean proteins, and has been associated with reduced risk of cognitive decline. Additionally, getting adequate, quality sleep and managing chronic conditions with the help of healthcare providers are essential components of healthy aging.  Continue to stay socially and mentally engaged. Maintaining strong social connections and regularly stimulating your brain can help protect against  cognitive decline. This includes staying connected with friends and family, volunteering, or participating in community groups. Mentally engaging activities--such as reading, doing puzzles, playing strategy games, or learning a new language or musical instrument--promote brain plasticity. If you are interested in activities to support cognitive engagement, this site offers a variety of apps and games organized by difficulty level:  https://www.barrowneuro.org/get-to-know-barrow/centers-programs/neurorehabilitation-center/neuro-rehab-apps-and-games/  Consider implementing compensatory strategies to maximize independence and maintain daily functioning. Examples include:  Adhere to routine. Compensatory strategies work best when they are used consistently. Use a planner, calendar, or white board that has the schedule and important events for the day clearly listed to reference and cross off when tasks are complete.  Ask for written information, especially if it is new or unfamiliar (e.g., information provided at a doctor's appointment).  Create an organized environment. Keep items that can be easily misplaced in a sensible location and get into the habit of always returning the items to those places. Pay attention and reduce distractions. Make a point of focusing attention on information you want to remember. One-on-one interaction is more likely to facilitate attention and minimize distraction. Make eye contact and repeat the information out loud after you hear it. Reduce interruptions or distractions especially when attempting to learn new information.  Create associations. When learning something new, think about and understand the information. Explain it in your own words or try to associate it with something you already know. Take notes to help remember important details. Evaluate goals and plan accordingly. When confronted by many different tasks, begin by making a list that prioritizes each task and  estimates the time it will take to complete. Break down complicated tasks into smaller, more manageable steps. Focus on one task at a time and complete each task before starting another. Avoid multitasking.  Performance across neurocognitive testing is not a strong predictor of an individual's safety operating a motor vehicle. Should her family wish to pursue a formalized driving evaluation, they could reach out  to the following agencies:  [employer] in [place]: [phone] [facility] in [place]: [phone] [facility] in [place]: [phone] [facility] in [place]: [phone] or [phone]  If patient requires legal assistance with durable powers of attorney, medical decision making, long-term care resource access, or other aspects of estate planning, they may consider contacting [employer] at [phone] for a free consultation.  DISPOSITION   Patient will follow up with the referring provider, Renee Santos. She should return for repeat neuropsychological testing in 12-18 months to monitor her course and assist with diagnosis and treatment planning. She and her husband will be provided verbal feedback in approximately one week regarding the findings and impression during this visit.  The remainder of the report includes the details of the patient's background and a table of results from the current evaluation, which support the summary and recommendations described above.  BACKGROUND   History of Presenting Illness: The following information was obtained from a review of medical records and an interview with the patient and her husband, Lamar. Briefly, the patient was evaluated by Renee Sevin, PA-C, at [facility] on 11/10/2023 for cognitive concerns within the past year. Ms. Santos noted strong concern for a possible underlying neurodegenerative disease process. MoCA = 20/30. She was referred for  neuropsychological evaluation accordingly.  Cognitive Functioning: During today's appointment, the patient and her husband reported cognitive changes over the past year to year and a half. They specifically noted concerns with short-term memory, although the husband appears to perceive this as more of an issue than the patient herself. He has often observed her repeating conversations and forgetting details of conversations. Patient stated that there is no consistent pattern to her forgetfulness. She occasionally misplaces items. On one occasion, she delivered flowers but forgot whom she had delivered them to until prompted. She frequently forgets the plot of a book she is reading. She does not feel that attention is a problem, though her husband believes she is less able to focus and can become easily distracted. She reported that her processing speed seems slower and that tasks involving navigation or executive functioning (e.g., planning, organizing, problem-solving) require more effort and can provoke anxiety. Consequently, she finds herself delegating more tasks. Her husband has not observed any decline in her judgment or problem-solving abilities. She does not report significant word-finding difficulties.  Physical Functioning: Patient endorsed difficulties with sleep initiation and maintenance and reported taking trazodone  as needed, although it does not consistently help with sleep maintenance. She feels that sleep maintenance issues are usually not due to anxiety. While her energy level is relatively preserved, both she and her husband reported that she has been feeling more tired during the day lately. Her appetite has relatively decreased. She denied any changes in taste or smell and also denied dysphagia. She reported that her vision and hearing are stable, although her husband has noted a possible mild decline in her hearing. She denied balance problems, falls, and tremors.  Emotional Functioning:  Patient described her recent mood as generally stable, although she reported becoming easily overwhelmed at times. She also noted some anxiety regarding today's appointment, for which she took lorazepam  this morning. She denied any suicidal ideation. She described maintaining an active lifestyle, including regular morning walks, participation in a book club, volunteering, etc.  Neuroimaging: MRI of the brain (11/17/2023) documented mild age-related cerebral atrophy with chronic microvascular ischemic disease.  Other Relevant Medical History: Remarkable for hypertension, mitral valvular disorder, hyperlipidemia, prediabetes, hypothyroidism,  stage III chronic kidney disease, osteoporosis, and psoriasis. Please refer to the medical record for a more comprehensive problem list. No history of stroke, CNS infection, head injury, or seizure was reported.  Current Medications: Per record, alendronate , biotin forte, Caltrate Plus, clobetasol  cream, hydrochlorothiazide , levothyroxine , lorazepam , metoprolol , Ocuvite Lutein, trazodone , valacyclovir, vibegron , and vitamin B12.  Functional Status: Patient independently performs all basic and instrumental activities of daily living. She continues to drive without any reported accidents, traffic violations, or episodes of getting lost. She reliably takes her medications and schedules all appointments in her planner, denying any missed appointments. She co-manages finances with her husband and reports no errors in the aspects for which she is responsible. While most meals are prepared at her community residence, she stated that she is able to cook or heat food as needed. She denied any difficulties operating household appliances.  Family Neurological History: Remarkable for dementia in the patient's mother, father, maternal grandmother, paternal aunt, and paternal cousin.  Psychiatric History: Remarkable for situational anxiety, currently managed with as-needed  medication prescribed by her primary care provider. Patient acknowledged that she rarely takes this medication. Of note, she reported being prescribed psychotropic medication decades ago in the context of significant personal and family stressors, as well as anxiety and panic attacks while driving. She used that medication only temporarily and discontinued it. She also engaged in brief counseling when her husband was dealing with prostate cancer. Otherwise, she denied a history of depression, suicidal ideation, hallucinations, and psychiatric hospitalizations was not reported.  Substance Use History: Patient consumes one to two glasses of wine most days. Current use of nicotine, marijuana, and other illicit substances was denied.  Social and Developmental History: Patient was born in [place], [place]. History of perinatal complications and developmental delays was not reported. She is married and lives with her husband at [employer]. They have two children.  Educational and Occupational History: No history of childhood learning disability, special education services, or grade retention was reported. Patient described herself as an A/B/C student and reflected that she may not have applied herself fully in school, noting that she was a spoiled only child. 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   BEHAVIORAL OBSERVATIONS   Patient arrived on time and was accompanied by her husband, Lamar. She ambulated independently and without gait disturbance. She was alert and fully oriented. She was appropriately groomed and dressed for the setting. No significant sensory or motor abnormalities were observed. Vision (with glasses) and hearing were adequate for testing purposes. Speech was of normal rate, prosody, and volume. No conversational word-finding difficulties, paraphasic errors, or dysarthria were observed. Comprehension was  conversationally intact. Thought processes were linear, logical, and coherent. Thought content was organized and devoid of delusions. Insight appeared fair. Affect was even and congruent with euthymic mood.  She was cooperative during testing. One standalone measure of performance validity was low. However, given her clinical presentation and overall pattern of scores, this finding is most likely indicative of true memory impairment. While some caution is warranted, the results are considered to accurately reflect her current cognitive functioning.  Of note, she reported taking lorazepam  this morning, which she usually only uses occasionally as needed.  NEUROPSYCHOLOGICAL TESTING RESULTS   Tests Administered: Animal Naming Test; Brief Visuospatial Memory Test-Revised (BVMT-R) - Form 1; Controlled Oral Word Association Test (COWAT): FAS; Delis-Kaplan Executive Function System (D-KEFS) - Subtest(s): Color-Word Interference Test; Geriatric Anxiety Scale-10  Item (GAS-10); Geriatric Depression Scale Short Form (GDS-SF); Hopkins Verbal Learning Test-Revised (HVLT-R) - From 1; Judgment of Line Orientation (JLO) - Form H; Neuropsychological Assessment Battery (NAB) - Subtest(s): Naming Form 1; Standalone performance validity test (PVT); Test of Premorbid Functioning (TOPF); Trail Making Test (TMT); Wechsler Adult Intelligence Scale Fifth Edition (WAIS-5) - Subtest(s): Similarities, Clinical Cytogeneticist, Digits Forward, Digit Sequencing, Coding, Symbol Search, Digits Backward; and Wechsler Memory Scale Fourth Edition (WMS-IV) - Subtest(s): Logical Memory (LM).  Test results are provided in the table below. Whenever possible, the patient's scores were compared against age-, sex-, and education-corrected normative samples. Interpretive descriptions are based on the AACN consensus conference statement on uniform labeling (Guilmette et al., 2020).  PREMORBID FUNCTIONING RAW  RANGE  TOPF 47 StdS=105 Average  ATTENTION &  WORKING MEMORY RAW  RANGE  WAIS-5 Digits Forward -- ss=7 Low Average  WAIS-5 Digits Backward -- ss=8 Average  WAIS-5 Digit Sequencing -- ss=9 Average  PROCESSING SPEED RAW  RANGE  Trails A 50''0e T=40 Low Average  WAIS-5 Coding  -- ss=10 Average  WAIS-5 Symbol Search -- ss=10 Average  DKEFS CWIT Color Naming 32''0e ss=11 Average  DKEFS CWIT Word Reading 23''0e ss=11 Average  EXECUTIVE FUNCTION RAW  RANGE  Trails B 152''0e T=39 Low Average  WAIS-5 Similarities -- ss=10 Average  COWAT Letter Fluency 14+15+18 T=55 Average  DKEFS CWIT Inhibition 97''4e ss=6 Low Average  DKEFS CWIT Inhibition/Switching 134''3e ss=3 Exceptionally Low  LANGUAGE RAW  RANGE  COWAT Letter Fluency 14+15+18 T=55 Average  Animal Naming Test 15 T=43 Average  NAB Naming Test 28/31 T=40 WNL  VISUOSPATIAL RAW  RANGE  WAIS-5 Block Design -- ss=8 Average  JLO C/S=26/30 56%ile Average  BVMT-R Copy Trial 12/12 -- WNL  VERBAL LEARNING & MEMORY RAW  RANGE  HVLT-R Learning Trials (4+6+6)/36 T=36 Below Average  HVLT-R Delayed Recall 0/12 T=19 Exceptionally Low  HVLT-R Percent Retained 0 T<=20 Exceptionally Low  HVLT-R Recognition Hits 11 -- --  HVLT-R Recognition False Positives 3 -- --  HVLT-R Discrimination Index 8 T=37 Low Average  WMS-IV LM-I  (6+9+10)/53 ss=8 Average  WMS-IV LM-II  (0+6)/39 ss=6 Low Average  WMS-IV LM Recognition  (5+9)/23 10-16%ile Low Average  VISUAL LEARNING & MEMORY RAW  RANGE  BVMT-R Total Recall (1+1+2)/36 T=22 Exceptionally Low  BVMT-R Delayed Recall 0/12 T=20 Exceptionally Low  BVMT-R Percent Retained 0 <1%ile Exceptionally Low  BVMT-R Recognition Hits 6 >16%ile WNL  BVMT-R Recognition False Alarms 4 <1%ile Exceptionally Low  BVMT-R Recognition Discrimination Index 2 3-5%ile Below Average  QUESTIONNAIRES RAW  RANGE  GDS-SF 2 -- Minimal  GAS-10 6 -- Minimal  *Note: ss = scaled score; StdS = standard score; T = t-score; C/S = corrected raw score; WNL = within normal limits; BNL= below  normal limits; D/C = discontinued. Scores from skewed distributions are typically interpreted as WNL (>=16th %ile) or BNL (<16th %ile).   INFORMED CONSENT   Patient was provided with a verbal description of the nature and purpose of the neuropsychological evaluation. Also reviewed were the foreseeable risks and/or discomforts and benefits of the procedure, limits of confidentiality, and mandatory reporting requirements of this provider. Patient was given the opportunity to have their questions answered. Oral consent to participate was provided by the patient.   This report was prepared as part of a clinical evaluation and is not intended for forensic use.  SERVICE   This evaluation was conducted by Renda Beckwith, Psy.D. In addition to time spent directly with the patient, total professional  time (180 minutes) includes record review, integration of relevant medical history, test selection, interpretation of findings, and report preparation. A technician, Lonell Jude, B.S., provided testing and scoring assistance (110 minutes).  Psychiatric Diagnostic Evaluation Services (Professional): 09208 x 1 Neuropsychological Testing Evaluation Services (Professional): 03867 x 1 Neuropsychological Testing Evaluation Services (Professional): 03866 x 2 Neuropsychological Test Administration and Scoring (Technician): [phone] x 1 Neuropsychological Test Administration and Scoring (Technician): [phone] x 3  This report was generated using voice recognition software. While this document has been carefully reviewed, transcription errors may be present. I apologize in advance for any inconvenience. Please contact me if further clarification is needed.            Renda Beckwith, Psy.D.             Neuropsychologist

## 2024-01-05 DIAGNOSIS — M542 Cervicalgia: Secondary | ICD-10-CM | POA: Diagnosis not present

## 2024-01-05 DIAGNOSIS — M5126 Other intervertebral disc displacement, lumbar region: Secondary | ICD-10-CM | POA: Diagnosis not present

## 2024-01-15 DIAGNOSIS — M1711 Unilateral primary osteoarthritis, right knee: Secondary | ICD-10-CM | POA: Diagnosis not present

## 2024-01-15 DIAGNOSIS — M1712 Unilateral primary osteoarthritis, left knee: Secondary | ICD-10-CM | POA: Diagnosis not present

## 2024-01-15 DIAGNOSIS — M170 Bilateral primary osteoarthritis of knee: Secondary | ICD-10-CM | POA: Diagnosis not present

## 2024-01-15 DIAGNOSIS — M25561 Pain in right knee: Secondary | ICD-10-CM | POA: Insufficient documentation

## 2024-01-16 ENCOUNTER — Ambulatory Visit: Admitting: Psychology

## 2024-01-16 DIAGNOSIS — F0670 Mild neurocognitive disorder due to known physiological condition without behavioral disturbance: Secondary | ICD-10-CM | POA: Diagnosis not present

## 2024-01-16 NOTE — Progress Notes (Signed)
   NEUROPSYCHOLOGY FEEDBACK SESSION [place]. [facility]  [place] Department of Neurology  Date of Feedback Session: 01/16/2024  REASON FOR REFERRAL   Renee Santos is a 78 year old, right-handed, White female with 16 years of formal education. She was referred for neuropsychological evaluation by Camie Sevin, PA-C, to assess current neurocognitive functioning, document potential cognitive deficits, and assist with treatment planning. This is her first neuropsychological evaluation.  FEEDBACK   Patient completed a comprehensive neuropsychological evaluation on 01/02/2024. Please refer to that encounter for the full report and recommendations. Briefly, results indicated deficits largely isolated to learning/memory. In the setting of preserved functional independence, findings support a diagnosis of mild neurocognitive disorder (mild cognitive impairment). The clinical presentation raises concern for a possible underlying neurodegenerative process, such as Alzheimer's disease, especially given the observed memory impairment. While anxiety, sleep disturbance, and mild cerebrovascular changes may exacerbate cognitive difficulties, the degree of memory impairment is not necessarily characteristic of these factors alone.  Today, the patient was accompanied by her husband. They were provided verbal feedback regarding the findings and impression during this visit, and their questions were answered. A copy of the report was provided at the conclusion of the visit.  DISPOSITION   Patient will follow up with the referring provider, [name]. She should return for repeat neuropsychological testing in 12-18 months to monitor her course and assist with diagnosis and treatment planning.  SERVICE   This feedback session was conducted by Renda Beckwith, Psy.D. One unit of 03867 (50 minutes) was billed for Dr. Beckwith' time spent in preparing, conducting, and documenting the current feedback  session.  This report was generated using voice recognition software. While this document has been carefully reviewed, transcription errors may be present. I apologize in advance for any inconvenience. Please contact me if further clarification is needed.

## 2024-02-02 NOTE — Progress Notes (Signed)
 "   Assessment/Plan:   Mild Cognitive Impairment likely due to Alzheimer's disease  Renee Santos is a delightful 78 y.o. RH female with a history of  hypertension, hyperlipidemia, hypothyroidism, situational anxiety, prediabetes, CKD, B12 deficiency seen today in follow up after neuropsych testing results from December 2025, yielding  a diagnosis of mild cognitive impairment with concerns for Alzheimer's disease.   Patient is accompanied in the office by her husband. Patient was first seen on 11/10/2023 with a MoCA of 20/30.  Currently she is not on antidementia medication, we discussed initiating A[facility]I in an effort to slow down cognitive decline, she agrees to proceed. We also discussed other testing including LP as a diagnostic tool for rule out Ad, she may contact us  in the near future. She is possibly interested in evaluation at [facility] if it were to be a diagnosis of AD. For now, would recommend to start with ACHI while addressing mood and sleep with PCP.   She is able to participate to her ADLs.  Her mood is anxious we discussed the role of psychotherapy on the circumstances.   Follow up in 6 months. Repeat neuropsych evaluation in 12 to 18 months for disease trajectory Start donepezil  10 mg daily as directed, side effects discussed Continue to control mood as per PCP Recommend psychotherapy for situational anxiety Discuss insomnia with PCP Recommend good control of cardiovascular risk factors Discussed the use of AI scribe software for clinical note transcription with the patient, who gave verbal consent to proceed.      Initial visit 11/10/2023 How long did patient have memory difficulties?  For about 6 months, maybe longer. I have been always a note taker and it has gotten fuzzier, especially with names .  She also has some difficulty with new information, recent conversations.  LTM is good.  repeats oneself?  Endorsed, for the last 6 months.  Disoriented when walking into a room? Denies     Leaving objects in unusual places?  Denies.   Wandering behavior? Denies.   Any personality changes, or depression, anxiety? Situational anxiety, I am more short tempered, she was taking Ativan  but cannot tell the difference so I have not been taking it .  She worries a lot even at night, keeping me away .  Hallucinations or paranoia? Denies.   Seizures? Denies.    Any sleep changes?  Does not sleep well.  I think about my memory open during the night .  Denies frequent nightmares or dream reenactment, other REM behavior or sleepwalking   Sleep apnea? Denies.   Any hygiene concerns?  Denies.   Independent of bathing and dressing? Endorsed  Does the patient need help with medications? Patient is in charge   Who is in charge of the finances?  Husband is in charge     Any changes in appetite?   She does not eat well-husband says   Patient have trouble swallowing?  Denies.   Any headaches?  Denies.   Chronic pain? Denies.   Ambulates with difficulty? Denies. Walks 6 miles in the park with her friends. Recent falls or head injuries? Denies.     Vision changes?  Denies any new issues.   Any strokelike symptoms? Denies.   Any tremors? Denies.  Any anosmia? Denies.   Any incontinence of urine? Has OAB controlled with Gemtesa . Any bowel dysfunction? Denies.      Patient lives at [place] [facility] [place] 1 year ago with her husband.    History of heavy alcohol  intake? Denies.  History of heavy tobacco use? Denies.   Family history of dementia?  Mother, maternal cousin, maternal grandmother and aunt had dementia ? Type. Does patient drive? Yes, she admits to having made the wrong turn a couple of times. Retired [employer].  College degree.  Neuropsych evaluation 01/02/2024 briefly, results indicated deficits largely isolated to learning/memory. In the setting of preserved functional independence, findings support a diagnosis of mild neurocognitive disorder (mild cognitive impairment). The clinical  presentation raises concern for a possible underlying neurodegenerative process, such as Alzheimer's disease, especially given the observed memory impairment. While anxiety, sleep disturbance, and mild cerebrovascular changes may exacerbate cognitive difficulties, the degree of memory impairment is not necessarily characteristic of these factors alone.   MRI of the brain 11/10/2023, personally reviewed remarkable for mild age-related cerebral atrophy with chronic microvascular ischemic disease, no acute intracranial abnormalities     CURRENT MEDICATIONS:  Outpatient Encounter Medications as of 02/03/2024  Medication Sig   alendronate  (FOSAMAX ) 70 MG tablet Take 1 tablet (70 mg total) by mouth every 7 (seven) days. Take with a full glass of water on an empty stomach.   BIOTIN FORTE PO Take 1 tablet by mouth.   Calcium Carbonate-Vit D-Min (CALTRATE PLUS PO) Take by mouth daily.     clobetasol  cream (TEMOVATE ) 0.05 % Apply 1 application topically 2 (two) times daily.   Cyanocobalamin  (VITAMIN B-12 PO) Take by mouth.   diazepam  (VALIUM ) 5 MG tablet Take one tab one hour prior to MRI and may repeat x 1 if needed   hydrochlorothiazide  (HYDRODIURIL ) 25 MG tablet Take 12.5 mg by mouth every morning.   levothyroxine  (SYNTHROID ) 75 MCG tablet Take 1 tablet by mouth 6 days a week   LORazepam  (ATIVAN ) 0.5 MG tablet Take 1 tablet by mouth twice daily as needed for anxiety   metoprolol  succinate (TOPROL -XL) 50 MG 24 hr tablet Take 1/2 tablet by mouth every day   multivitamin-lutein (OCUVITE-LUTEIN) CAPS capsule Take 1 capsule by mouth daily.   traZODone  (DESYREL ) 50 MG tablet Take 1 tablet (50 mg total) by mouth at bedtime.   valACYclovir (VALTREX) 1000 MG tablet Take 1,000 mg by mouth daily. (Patient taking differently: Take 1,000 mg by mouth as needed.)   Vibegron  (GEMTESA ) 75 MG TABS Take 1 tablet (75 mg total) by mouth daily.   No facility-administered encounter medications on file as of 02/03/2024.        12/24/2016    8:18 AM  MMSE - Mini Mental State Exam  Orientation to time 5   Orientation to Place 5   Registration 3   Attention/ Calculation 3   Recall 2   Language- name 2 objects 2   Language- repeat 1  Language- follow 3 step command 3   Language- read & follow direction 1   Write a sentence 1   Copy design 1   Total score 27      Data saved with a previous flowsheet row definition      11/10/2023    4:00 PM  Montreal Cognitive Assessment   Visuospatial/ Executive (0/5) 2  Naming (0/3) 3  Attention: Read list of digits (0/2) 2  Attention: Read list of letters (0/1) 1  Attention: Serial 7 subtraction starting at 100 (0/3) 3  Language: Repeat phrase (0/2) 1  Language : Fluency (0/1) 1  Abstraction (0/2) 1  Delayed Recall (0/5) 0  Orientation (0/6) 6  Total 20  Adjusted Score (based on education) 20   Thank you for  allowing us  the opportunity to participate in the care of this nice patient. Please do not hesitate to contact us  for any questions or concerns.   Total time spent on today's visit was 45 minutes dedicated to this patient today, preparing to see patient, examining the patient, ordering tests and/or medications and counseling the patient, documenting clinical information in the EHR or other health record, independently interpreting results and communicating results to the patient/family, discussing treatment and goals, answering patient's questions and coordinating care.  Cc:  Geofm Glade PARAS, MD  Camie Sevin 02/03/2024 2:49 PM      "

## 2024-02-03 ENCOUNTER — Encounter: Payer: Self-pay | Admitting: Physician Assistant

## 2024-02-03 ENCOUNTER — Ambulatory Visit: Admitting: Physician Assistant

## 2024-02-03 VITALS — BP 123/73 | HR 65 | Resp 20 | Ht 61.5 in | Wt 94.0 lb

## 2024-02-03 DIAGNOSIS — F0670 Mild neurocognitive disorder due to known physiological condition without behavioral disturbance: Secondary | ICD-10-CM | POA: Diagnosis not present

## 2024-02-03 MED ORDER — DONEPEZIL HCL 10 MG PO TABS: ORAL_TABLET | ORAL | 3 refills | Status: AC

## 2024-02-03 NOTE — Patient Instructions (Signed)
 It was a pleasure to see you today at our office.   Recommendations:  June 30 at 11:30 am   [email or web address]   RECOMMENDATIONS FOR ALL PATIENTS WITH MEMORY PROBLEMS: 1. Continue to exercise (Recommend 30 minutes of walking everyday, or 3 hours every week) 2. Increase social interactions - continue going to [place] and enjoy social gatherings with friends and family 3. Eat healthy, avoid fried foods and eat more fruits and vegetables 4. Maintain adequate blood pressure, blood sugar, and blood cholesterol level. Reducing the risk of stroke and cardiovascular disease also helps promoting better memory. 5. Avoid stressful situations. Live a simple life and avoid aggravations. Organize your time and prepare for the next day in anticipation. 6. Sleep well, avoid any interruptions of sleep and avoid any distractions in the bedroom that may interfere with adequate sleep quality 7. Avoid sugar, avoid sweets as there is a strong link between excessive sugar intake, diabetes, and cognitive impairment We discussed the Mediterranean diet, which has been shown to help patients reduce the risk of progressive memory disorders and reduces cardiovascular risk. This includes eating fish, eat fruits and green leafy vegetables, nuts like almonds and hazelnuts, walnuts, and also use olive oil. Avoid fast foods and fried foods as much as possible. Avoid sweets and sugar as sugar use has been linked to worsening of memory function.  There is always a concern of gradual progression of memory problems. If this is the case, then we may need to adjust level of care according to patient needs. Support, both to the patient and caregiver, should then be put into place.      You have been referred for a neuropsychological evaluation (i.e., evaluation of memory and thinking abilities). Please bring someone with you to this appointment if possible, as it is helpful for  the doctor to hear from both you and another adult who knows you well. Please bring eyeglasses and hearing aids if you wear them.    The evaluation will take approximately 3 hours and has two parts:   The first part is a clinical interview with the neuropsychologist (Dr. Richie or Dr. Gayland). During the interview, the neuropsychologist will speak with you and the individual you brought to the appointment.    The second part of the evaluation is testing with the doctor's technician Neal or Luke). During the testing, the technician will ask you to remember different types of material, solve problems, and answer some questionnaires. Your family member will not be present for this portion of the evaluation.   Please note: We must reserve several hours of the neuropsychologist's time and the psychometrician's time for your evaluation appointment. As such, there is a No-Show fee of $100. If you are unable to attend any of your appointments, please contact our office as soon as possible to reschedule.      DRIVING: Regarding driving, in patients with progressive memory problems, driving will be impaired. We advise to have someone else do the driving if trouble finding directions or if minor accidents are reported. Independent driving assessment is available to determine safety of driving.   If you are interested in the driving assessment, you can contact the following:  [facility] [employer] [facility] in [place] [phone]  [facility] [phone]  [facility] [phone]  [facility] [phone] or [phone]   FALL PRECAUTIONS: Be cautious when walking. Scan the area for obstacles that may increase the risk of trips and falls. When getting up in the mornings, sit up  at the edge of the bed for a few minutes before getting out of bed. Consider elevating the bed at the head end to avoid drop of blood pressure when getting up. Walk always in a well-lit room (use  night lights in the walls). Avoid area rugs or power cords from appliances in the middle of the walkways. Use a walker or a cane if necessary and consider physical therapy for balance exercise. Get your eyesight checked regularly.  FINANCIAL OVERSIGHT: Supervision, especially oversight when making financial decisions or transactions is also recommended.  HOME SAFETY: Consider the safety of the kitchen when operating appliances like stoves, microwave oven, and blender. Consider having supervision and share cooking responsibilities until no longer able to participate in those. Accidents with firearms and other hazards in the house should be identified and addressed as well.   ABILITY TO BE LEFT ALONE: If patient is unable to contact 911 operator, consider using LifeLine, or when the need is there, arrange for someone to stay with patients. Smoking is a fire hazard, consider supervision or cessation. Risk of wandering should be assessed by caregiver and if detected at any point, supervision and safe proof recommendations should be instituted.  MEDICATION SUPERVISION: Inability to self-administer medication needs to be constantly addressed. Implement a mechanism to ensure safe administration of the medications.      Mediterranean Diet A Mediterranean diet refers to food and lifestyle choices that are based on the traditions of countries located on the [place]. This way of eating has been shown to help prevent certain conditions and improve outcomes for people who have chronic diseases, like kidney disease and heart disease. What are tips for following this plan? Lifestyle  Cook and eat meals together with your family, when possible. Drink enough fluid to keep your urine clear or pale yellow. Be physically active every day. This includes: Aerobic exercise like running or swimming. Leisure activities like gardening, walking, or housework. Get 7-8 hours of sleep each night. If recommended  by your health care provider, drink red wine in moderation. This means 1 glass a day for nonpregnant women and 2 glasses a day for men. A glass of wine equals 5 oz (150 mL). Reading food labels  Check the serving size of packaged foods. For foods such as rice and pasta, the serving size refers to the amount of cooked product, not dry. Check the total fat in packaged foods. Avoid foods that have saturated fat or trans fats. Check the ingredients list for added sugars, such as corn syrup. Shopping  At the grocery store, buy most of your food from the areas near the walls of the store. This includes: Fresh fruits and vegetables (produce). Grains, beans, nuts, and seeds. Some of these may be available in unpackaged forms or large amounts (in bulk). Fresh seafood. Poultry and eggs. Low-fat dairy products. Buy whole ingredients instead of prepackaged foods. Buy fresh fruits and vegetables in-season from local farmers markets. Buy frozen fruits and vegetables in resealable bags. If you do not have access to quality fresh seafood, buy precooked frozen shrimp or canned fish, such as tuna, salmon, or sardines. Buy small amounts of raw or cooked vegetables, salads, or olives from the deli or salad bar at your store. Stock your pantry so you always have certain foods on hand, such as olive oil, canned tuna, canned tomatoes, rice, pasta, and beans. Cooking  Cook foods with extra-virgin olive oil instead of using butter or other vegetable oils. Have meat as a  side dish, and have vegetables or grains as your main dish. This means having meat in small portions or adding small amounts of meat to foods like pasta or stew. Use beans or vegetables instead of meat in common dishes like chili or lasagna. Experiment with different cooking methods. Try roasting or broiling vegetables instead of steaming or sauteing them. Add frozen vegetables to soups, stews, pasta, or rice. Add nuts or seeds for added healthy fat  at each meal. You can add these to yogurt, salads, or vegetable dishes. Marinate fish or vegetables using olive oil, lemon juice, garlic, and fresh herbs. Meal planning  Plan to eat 1 vegetarian meal one day each week. Try to work up to 2 vegetarian meals, if possible. Eat seafood 2 or more times a week. Have healthy snacks readily available, such as: Vegetable sticks with hummus. Greek yogurt. Fruit and nut trail mix. Eat balanced meals throughout the week. This includes: Fruit: 2-3 servings a day Vegetables: 4-5 servings a day Low-fat dairy: 2 servings a day Fish, poultry, or lean meat: 1 serving a day Beans and legumes: 2 or more servings a week Nuts and seeds: 1-2 servings a day Whole grains: 6-8 servings a day Extra-virgin olive oil: 3-4 servings a day Limit red meat and sweets to only a few servings a month What are my food choices? Mediterranean diet Recommended Grains: Whole-grain pasta. Brown rice. Bulgar wheat. Polenta. Couscous. Whole-wheat bread. [place] Madeira. Vegetables: Artichokes. Beets. Broccoli. Cabbage. Carrots. Eggplant. Green beans. Chard. Kale. Spinach. Onions. Leeks. Peas. Squash. Tomatoes. Peppers. Radishes. Fruits: Apples. Apricots. Avocado. Berries. Bananas. Cherries. Dates. Figs. Grapes. Lemons. Melon. Oranges. Peaches. Plums. Pomegranate. Meats and other protein foods: Beans. Almonds. Sunflower seeds. Pine nuts. Peanuts. Cod. Salmon. Scallops. Shrimp. Tuna. Tilapia. Clams. Oysters. Eggs. Dairy: Low-fat milk. Cheese. Greek yogurt. Beverages: Water. Red wine. Herbal tea. Fats and oils: Extra virgin olive oil. Avocado oil. Grape seed oil. Sweets and desserts: Greek yogurt with honey. Baked apples. Poached pears. Trail mix. Seasoning and other foods: Basil. Cilantro. Coriander. Cumin. Mint. Parsley. Sage. Rosemary. Tarragon. Garlic. Oregano. Thyme. Pepper. Balsalmic vinegar. Tahini. Hummus. Tomato sauce. Olives. Mushrooms. Limit these Grains: Prepackaged  pasta or rice dishes. Prepackaged cereal with added sugar. Vegetables: Deep fried potatoes (french fries). Fruits: Fruit canned in syrup. Meats and other protein foods: Beef. Pork. Lamb. Poultry with skin. Hot dogs. [place]. Dairy: Ice cream. Sour cream. Whole milk. Beverages: Juice. Sugar-sweetened soft drinks. Beer. Liquor and spirits. Fats and oils: Butter. Canola oil. Vegetable oil. Beef fat (tallow). Lard. Sweets and desserts: Cookies. Cakes. Pies. Candy. Seasoning and other foods: Mayonnaise. Premade sauces and marinades. The items listed may not be a complete list. Talk with your dietitian about what dietary choices are right for you. Summary The Mediterranean diet includes both food and lifestyle choices. Eat a variety of fresh fruits and vegetables, beans, nuts, seeds, and whole grains. Limit the amount of red meat and sweets that you eat. Talk with your health care provider about whether it is safe for you to drink red wine in moderation. This means 1 glass a day for nonpregnant women and 2 glasses a day for men. A glass of wine equals 5 oz (150 mL). This information is not intended to replace advice given to you by your health care provider. Make sure you discuss any questions you have with your health care provider. Document Released: 09/21/2015 Document Revised: 10/24/2015 Document Reviewed: 09/21/2015 Elsevier Interactive Patient Education  2017 [employer].

## 2024-02-09 ENCOUNTER — Telehealth: Payer: Self-pay

## 2024-02-09 ENCOUNTER — Ambulatory Visit: Payer: Self-pay | Admitting: *Deleted

## 2024-02-09 MED ORDER — OSELTAMIVIR PHOSPHATE 75 MG PO CAPS: 75.0000 mg | ORAL_CAPSULE | Freq: Two times a day (BID) | ORAL | 0 refills | Status: DC

## 2024-02-09 NOTE — Telephone Encounter (Signed)
 FYI Only or Action Required?: Action required by provider: request for appointment and clinical question for provider.  Patient was last seen in primary care on 10/27/2023 by Geofm Glade PARAS, MD.  Called Nurse Triage reporting Influenza.  Symptoms began today.  Interventions attempted: Rest, hydration, or home remedies.  Symptoms are: stable.  Triage Disposition: Call PCP Within 24 Hours  Patient/caregiver understands and will follow disposition?: No, wishes to speak with PCP   Please advise if VV can be scheduled today . None available until 02/11/24. See NT encounter. Patient requesting call back.               Copied from CRM #[id number]. Topic: Clinical - Red Word Triage >> Feb 09, 2024 11:26 AM Wess RAMAN wrote: Red Word that prompted transfer to Nurse Triage: Tested Positive for flu A would like to know what she should do.  Symptoms- hoarse, cough Reason for Disposition  [1] Patient is NOT HIGH RISK AND [2] strongly requests antiviral medicine AND [3] flu symptoms present < 48 hours  Answer Assessment - Initial Assessment Questions Requesting if medication needed due to testing positive for influenza A this am. Minor sx cough, hoarseness. Exposed to husband that has been treated for flu. No available appt until 02/11/24 VV. Please advise if VV can be scheduled today or if PCP feels she needs Rx for tamiflu . Sx noted today but patient has been around husband. If PCP recommends medication please send to [facility] off of [address]. Recommended if sx worsen call back or go to UC or scheduled VV. Patient requesting call back.       1. WORST SYMPTOM: What is your worst symptom? (e.g., cough, runny nose, muscle aches, headache, sore throat, fever)      Cough, hoarse.  2. ONSET: When did your flu symptoms start?      Today  3. COUGH: How bad is the cough?       Not bad like clearing throat  4. RESPIRATORY DISTRESS: Describe your breathing.      No  5.  FEVER: Do you have a fever? If Yes, ask: What is your temperature, how was it measured, and when did it start?     Na  6. EXPOSURE: Were you exposed to someone with influenza?       Yes husband  7. FLU VACCINE: Did you get a flu shot this year?     Yes  8. HIGH RISK DISEASE: Do you have any chronic medical problems? (e.g., heart or lung disease, asthma, weak immune system, or other HIGH RISK conditions)     No  9. PREGNANCY: Is there any chance you are pregnant? When was your last menstrual period?     na 10. OTHER SYMPTOMS: Do you have any other symptoms?  (e.g., runny nose, muscle aches, headache, sore throat)       Cough, hoarse  Protocols used: Influenza (Flu) - [facility]

## 2024-02-09 NOTE — Telephone Encounter (Signed)
 Copied from CRM #[id number]. Topic: General - Other >> Feb 09, 2024  3:16 PM Ameerah G wrote: Reason for CRM: patient called to follow up about getting an appt. I checked the chart and saw a rx might be sent in. Please advise [phone]

## 2024-02-09 NOTE — Telephone Encounter (Signed)
 Ok for tamilfu = done erx

## 2024-02-10 ENCOUNTER — Other Ambulatory Visit: Payer: Self-pay

## 2024-02-10 MED ORDER — OSELTAMIVIR PHOSPHATE 75 MG PO CAPS: 75.0000 mg | ORAL_CAPSULE | Freq: Two times a day (BID) | ORAL | 0 refills | Status: DC

## 2024-02-10 NOTE — Telephone Encounter (Signed)
 Spoke with patient today.

## 2024-02-16 ENCOUNTER — Other Ambulatory Visit: Payer: Self-pay | Admitting: Internal Medicine

## 2024-02-18 ENCOUNTER — Ambulatory Visit: Admitting: Internal Medicine

## 2024-02-18 ENCOUNTER — Encounter: Payer: Self-pay | Admitting: Internal Medicine

## 2024-02-18 VITALS — BP 126/68 | HR 74 | Ht 62.0 in | Wt 96.8 lb

## 2024-02-18 DIAGNOSIS — Z1211 Encounter for screening for malignant neoplasm of colon: Secondary | ICD-10-CM

## 2024-02-18 DIAGNOSIS — R634 Abnormal weight loss: Secondary | ICD-10-CM

## 2024-02-18 DIAGNOSIS — R630 Anorexia: Secondary | ICD-10-CM

## 2024-02-18 DIAGNOSIS — K5904 Chronic idiopathic constipation: Secondary | ICD-10-CM

## 2024-02-18 MED ORDER — NA SULFATE-K SULFATE-MG SULF 17.5-3.13-1.6 GM/177ML PO SOLN: 1.0000 | ORAL | 0 refills | Status: AC

## 2024-02-18 NOTE — Patient Instructions (Signed)
 You have been scheduled for a colonoscopy. Please follow written instructions given to you at your visit today.   If you use inhalers (even only as needed), please bring them with you on the day of your procedure.  DO NOT TAKE 7 DAYS PRIOR TO TEST- Trulicity (dulaglutide) Ozempic, Wegovy (semaglutide) Mounjaro, Zepbound (tirzepatide) Bydureon Bcise (exanatide extended release)  DO NOT TAKE 1 DAY PRIOR TO YOUR TEST Rybelsus (semaglutide) Adlyxin (lixisenatide) Victoza (liraglutide) Byetta (exanatide) ___________________________________________________________________________  _______________________________________________________  If your blood pressure at your visit was 140/90 or greater, please contact your primary care physician to follow up on this.  _______________________________________________________  If you are age 60 or older, your body mass index should be between 23-30. Your Body mass index is 17.7 kg/m. If this is out of the aforementioned range listed, please consider follow up with your Primary Care Provider.  If you are age 45 or younger, your body mass index should be between 19-25. Your Body mass index is 17.7 kg/m. If this is out of the aformentioned range listed, please consider follow up with your Primary Care Provider.   ________________________________________________________  The [facility] providers would like to encourage you to use MYCHART to communicate with providers for non-urgent requests or questions.  Due to long hold times on the telephone, sending your provider a message by [facility] may be a faster and more efficient way to get a response.  Please allow 48 business hours for a response.  Please remember that this is for non-urgent requests.  _______________________________________________________  [facility] is using a team-based approach to care.  Your team is made up of your doctor and two to three APPS. Our APPS (Nurse  Practitioners and Physician Assistants) work with your physician to ensure care continuity for you. They are fully qualified to address your health concerns and develop a treatment plan. They communicate directly with your gastroenterologist to care for you. Seeing the Advanced Practice Practitioners on your physician's team can help you by facilitating care more promptly, often allowing for earlier appointments, access to diagnostic testing, procedures, and other specialty referrals.   I appreciate the opportunity to care for you. Lupita Commander, MD, [facility]

## 2024-02-18 NOTE — Progress Notes (Signed)
 [address]       Zehava Turski Santos 79 y.o. 09/30/1945 [id number]  Assessment & Plan:   Encounter Diagnoses  Name Primary?   Colon cancer screening Yes   Chronic idiopathic constipation - mild    Loss of weight    Anorexia    We discussed the pros and cons of repeating a screening colonoscopy. Risks reviewed.  Will schedule screening colonoscopy.  Observe anorexia and weight loss - recent uptick in weight. Does not seem to be related to any possible gastrointestinal disturbance.      Subjective:   Chief Complaint:colon cancer screening ? Repeat colonoscopy  HPI 79 yo woman here w/ husband - she had a negative screening colonoscopy in 2015. Currently moves bowels about 3 days a week which is stable and chronic, Feels ok in between defecation. She does not have any other lower gastrointestinal symptoms. She moved into [facility] in the last year and her husband has had health problems. She is having some anorexia but no early satiety and has lost some weight. No dysphagia.  Lab Results  Component Value Date   WBC 5.4 10/27/2023   HGB 13.5 10/27/2023   HCT 39.5 10/27/2023   MCV 93.3 10/27/2023   PLT 217.0 10/27/2023   Lab Results  Component Value Date   NA 138 10/27/2023   CL 102 10/27/2023   K 3.7 10/27/2023   CO2 28 10/27/2023   BUN 19 10/27/2023   CREATININE 0.97 10/27/2023   GFR 56.77 (L) 10/27/2023   CALCIUM 9.6 10/27/2023   ALBUMIN 4.0 10/27/2023   GLUCOSE 116 (H) 10/27/2023     Wt Readings from Last 3 Encounters:  02/18/24 96 lb 12.8 oz (43.9 kg)  02/03/24 94 lb (42.6 kg)  11/10/23 98 lb (44.5 kg)    Allergies[1] Active Medications[2] Past Medical History:  Diagnosis Date   Cancer (HCC) 2011   Basal Cell of nose; Dr Jordan   Herpes zoster 2008   posterior thorax   Hypertension    Neurofibroma of back 2013    Dr Amy Jordan    Osteopenia 2016   T score -1.6 FRAX not calculated because of HRT   Psoriasis    Dr Amy Jordan   Thyroid  nodule    Past Surgical  History:  Procedure Laterality Date   ABDOMINAL HYSTERECTOMY      LSO  & appendectomy for endometrioma   @ age38 for ovarian cyst   APPENDECTOMY     cataract Bilateral Dec 2016;   not sure of date; wears readers only    COLONOSCOPY  multiple   OOPHORECTOMY     LSO for cyst   TONSILLECTOMY     Social History   Social History Narrative   Lives with husband and has 1 dog/2025  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     Education: college.   Are you right handed or left handed? right   Are you currently employed ?    What is your current occupation? retired   Do you live at home alone?   What type of home do you live in: 1 story or 2 story? One story       family history includes Asthma in her maternal grandfather and maternal uncle; Coronary artery disease in her maternal grandmother and mother; Hyperlipidemia in her mother; Hypertension in her father and mother; Hypothyroidism in her mother; Lung cancer in her father; Macular degeneration in her mother; Stroke (age of onset: 44) in her mother.   Review of Systems  As per HPI  Objective:   Physical Exam @BP  126/68   Pulse 74   Ht 5' 2 (1.575 m)   Wt 96 lb 12.8 oz (43.9 kg)   BMI 17.70 kg/m @  General:  NAD Eyes:   anicteric Lungs:  clear Heart::  S1S2 no rubs, murmurs or gallops Abdomen:  soft and nontender, BS+ Ext:   no edema, cyanosis or clubbing    Data Reviewed:  See HPI     [1] No Known Allergies [2]  Current Meds  Medication Sig   alendronate  (FOSAMAX ) 70 MG tablet Take 1 tablet (70 mg total) by mouth every 7 (seven) days. Take with a full glass of water on an empty stomach.   BIOTIN FORTE PO Take 1 tablet by mouth.   Calcium Carbonate-Vit D-Min (CALTRATE PLUS PO) Take by mouth daily.     clobetasol  cream (TEMOVATE ) 0.05 % Apply 1 application topically 2 (two) times daily.   Cyanocobalamin  (VITAMIN B-12 PO) Take by mouth.   diazepam  (VALIUM ) 5 MG tablet Take one tab one hour prior to MRI and may repeat x 1  if needed   levothyroxine  (SYNTHROID ) 75 MCG tablet Take 1 tablet by mouth 6 days a week   LORazepam  (ATIVAN ) 0.5 MG tablet Take 1 tablet by mouth twice daily as needed for anxiety   metoprolol  succinate (TOPROL -XL) 50 MG 24 hr tablet Take 1/2 tablet by mouth every day   multivitamin-lutein (OCUVITE-LUTEIN) CAPS capsule Take 1 capsule by mouth daily.   oseltamivir  (TAMIFLU ) 75 MG capsule Take 1 capsule (75 mg total) by mouth 2 (two) times daily.   traZODone  (DESYREL ) 50 MG tablet TAKE 1 TABLET BY MOUTH AT BEDTIME   valACYclovir (VALTREX) 1000 MG tablet Take 1,000 mg by mouth daily. (Patient taking differently: Take 1,000 mg by mouth as needed.)   Vibegron  (GEMTESA ) 75 MG TABS Take 1 tablet (75 mg total) by mouth daily.   "

## 2024-02-19 ENCOUNTER — Encounter: Payer: Self-pay | Admitting: Internal Medicine

## 2024-02-19 ENCOUNTER — Encounter: Payer: Self-pay | Admitting: Physician Assistant

## 2024-02-19 ENCOUNTER — Other Ambulatory Visit: Payer: Self-pay

## 2024-02-19 ENCOUNTER — Ambulatory Visit: Admitting: Internal Medicine

## 2024-02-19 VITALS — BP 124/70 | HR 80 | Temp 98.0°F | Ht 62.0 in | Wt 97.4 lb

## 2024-02-19 DIAGNOSIS — N1831 Chronic kidney disease, stage 3a: Secondary | ICD-10-CM

## 2024-02-19 DIAGNOSIS — I059 Rheumatic mitral valve disease, unspecified: Secondary | ICD-10-CM | POA: Diagnosis not present

## 2024-02-19 DIAGNOSIS — F418 Other specified anxiety disorders: Secondary | ICD-10-CM | POA: Diagnosis not present

## 2024-02-19 DIAGNOSIS — G479 Sleep disorder, unspecified: Secondary | ICD-10-CM

## 2024-02-19 DIAGNOSIS — R413 Other amnesia: Secondary | ICD-10-CM

## 2024-02-19 DIAGNOSIS — I10 Essential (primary) hypertension: Secondary | ICD-10-CM

## 2024-02-19 DIAGNOSIS — G3184 Mild cognitive impairment, so stated: Secondary | ICD-10-CM | POA: Diagnosis not present

## 2024-02-19 NOTE — Progress Notes (Signed)
 "     Subjective:    Patient ID: Renee Santos, female    DOB: Dec 06, 1945, 79 y.o.   MRN: [id number]     HPI Renee Santos is here for follow up of her chronic medical problems.  Renee Santos is here today with her husband.  Mild cognitive impairment-has seen neurology and neuropsychology.  Presentation raises concern for possible underlying neurodegenerative process such as Alzheimer's.  Sleep and anxiety could be contributing but does not explain all of the cognitive impairment.  Renee Santos was started on donepezil  5 mg daily for 2 weeks and then to increase to 10 mg daily.  Renee Santos is not currently taking this-Renee Santos picked it up, but has not started it.  Camie discussed possibly getting a lumbar puncture which would help determine if Renee Santos does have Alzheimer's.  After that Renee Santos would have to follow-up with [facility] and they may be able to offer her other options for treatment.  They are trying to consider if that is what they want to do or not.   Repeat neuropsychological testing recommended in 12-18 months.  This is of course very stressful.  It is causing some anxiety.  Original cardiology referral was placed, but that doctor's practice was full by the time Renee Santos was referred.  Renee Santos would like a different referral.   Medications and allergies reviewed with patient and updated if appropriate.  Medications Ordered Prior to Encounter[1]   Review of Systems     Objective:   Vitals:   02/19/24 0751  BP: 124/70  Pulse: 80  Temp: 98 F (36.7 C)  SpO2: 99%   BP Readings from Last 3 Encounters:  02/19/24 124/70  02/18/24 126/68  02/03/24 123/73   Wt Readings from Last 3 Encounters:  02/19/24 97 lb 6.4 oz (44.2 kg)  02/18/24 96 lb 12.8 oz (43.9 kg)  02/03/24 94 lb (42.6 kg)   Body mass index is 17.81 kg/m.    Physical Exam Constitutional:      General: Renee Santos is not in acute distress.    Appearance: Normal appearance. Renee Santos is not ill-appearing.  HENT:     Head: Normocephalic and atraumatic.  Skin:     General: Skin is warm and dry.  Neurological:     Mental Status: Renee Santos is alert. Mental status is at baseline.  Psychiatric:        Behavior: Behavior normal.        Thought Content: Thought content normal.        Judgment: Judgment normal.     Comments: Anxious        Lab Results  Component Value Date   WBC 5.4 10/27/2023   HGB 13.5 10/27/2023   HCT 39.5 10/27/2023   PLT 217.0 10/27/2023   GLUCOSE 116 (H) 10/27/2023   CHOL 205 (H) 10/27/2023   TRIG 84.0 10/27/2023   HDL 96.70 10/27/2023   LDLDIRECT 96.0 11/06/2010   LDLCALC 91 10/27/2023   ALT 15 10/27/2023   AST 20 10/27/2023   NA 138 10/27/2023   K 3.7 10/27/2023   CL 102 10/27/2023   CREATININE 0.97 10/27/2023   BUN 19 10/27/2023   CO2 28 10/27/2023   TSH 1.30 10/27/2023   HGBA1C 5.8 10/27/2023     Assessment & Plan:    See Problem List for Assessment and Plan of chronic medical problems.       [1]  Current Outpatient Medications on File Prior to Visit  Medication Sig Dispense Refill   alendronate  (FOSAMAX ) 70 MG tablet Take 1  tablet (70 mg total) by mouth every 7 (seven) days. Take with a full glass of water on an empty stomach. 12 tablet 3   BIOTIN FORTE PO Take 1 tablet by mouth.     Calcium Carbonate-Vit D-Min (CALTRATE PLUS PO) Take by mouth daily.       clobetasol  cream (TEMOVATE ) 0.05 % Apply 1 application topically 2 (two) times daily. 45 g 1   Cyanocobalamin  (VITAMIN B-12 PO) Take by mouth.     levothyroxine  (SYNTHROID ) 75 MCG tablet Take 1 tablet by mouth 6 days a week 77 tablet 3   LORazepam  (ATIVAN ) 0.5 MG tablet Take 1 tablet by mouth twice daily as needed for anxiety 30 tablet 2   metoprolol  succinate (TOPROL -XL) 50 MG 24 hr tablet Take 1/2 tablet by mouth every day 45 tablet 3   multivitamin-lutein (OCUVITE-LUTEIN) CAPS capsule Take 1 capsule by mouth daily.     Na Sulfate-K Sulfate-Mg Sulfate concentrate (SUPREP) 17.5-3.13-1.6 GM/177ML SOLN Take 1 kit (354 mLs total) by mouth as directed. 354  mL 0   traZODone  (DESYREL ) 50 MG tablet TAKE 1 TABLET BY MOUTH AT BEDTIME 90 tablet 1   valACYclovir (VALTREX) 1000 MG tablet Take 1,000 mg by mouth daily. (Patient taking differently: Take 1,000 mg by mouth as needed.)     Vibegron  (GEMTESA ) 75 MG TABS Take 1 tablet (75 mg total) by mouth daily. 30 tablet 5   donepezil  (ARICEPT ) 10 MG tablet Take half tablet (5 mg) daily for 2 weeks, then increase to the full tablet at 10 mg daily (Patient not taking: Reported on 02/19/2024) 90 tablet 3   No current facility-administered medications on file prior to visit.   "

## 2024-02-19 NOTE — Assessment & Plan Note (Signed)
 Chronic BP well controlled  HCTZ stopped at her last visit due to decreased GFR Continue metoprolol  XL 25 mg daily

## 2024-02-19 NOTE — Assessment & Plan Note (Signed)
 Chronic Mild Has been fairly stable for years - low risk for progression Has seen nephrology in the past-not sure if she truly had CKD HCTZ discontinued at last visit Blood pressure well-controlled

## 2024-02-19 NOTE — Patient Instructions (Addendum)
" °  A referral was ordered for Dr Wonda.   Call Camie and schedule the lumbar puncture.  Ask her about a therapist.   Therapy groups you can consider.   [facility] [phone]  [facility] [address] [phone]  [facility]            [address] [phone]    "

## 2024-02-19 NOTE — Assessment & Plan Note (Signed)
 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  EF normal.  Grade 1 DD Asymptomatic Referral ordered for Dr. Wonda

## 2024-02-19 NOTE — Assessment & Plan Note (Signed)
 Chronic Situational-intermittent anxiety Continue lorazepam  0.5 mg twice daily as needed Advsied trying to keep use to a minimum

## 2024-02-19 NOTE — Assessment & Plan Note (Addendum)
 Chronic Has seen neurology and neuropsychology Presentation raises concern for possible underlying neurodegenerative process such as Alzheimer's Sleep and anxiety could be contributing but do not explain all of the impairment Will go ahead and get the lumbar puncture via neurology to test for Alzheimer's disease The will look into seeing a therapist-Will ask neurology further recommendations-also getting places she can go Encouraged her to start the donepezil 

## 2024-02-19 NOTE — Assessment & Plan Note (Signed)
 Chronic Continue trazodone  50 mg nightly as needed

## 2024-02-20 ENCOUNTER — Encounter: Payer: Self-pay | Admitting: Internal Medicine

## 2024-02-23 ENCOUNTER — Encounter: Payer: Self-pay | Admitting: *Deleted

## 2024-02-25 ENCOUNTER — Encounter: Payer: Self-pay | Admitting: Internal Medicine

## 2024-03-03 ENCOUNTER — Encounter: Payer: Self-pay | Admitting: Internal Medicine

## 2024-03-03 ENCOUNTER — Ambulatory Visit: Admitting: Internal Medicine

## 2024-03-03 VITALS — BP 136/70 | HR 65 | Temp 97.8°F | Resp 15 | Ht 62.0 in | Wt 96.0 lb

## 2024-03-03 DIAGNOSIS — Z1211 Encounter for screening for malignant neoplasm of colon: Secondary | ICD-10-CM | POA: Diagnosis not present

## 2024-03-03 DIAGNOSIS — K562 Volvulus: Secondary | ICD-10-CM | POA: Diagnosis not present

## 2024-03-03 DIAGNOSIS — K5730 Diverticulosis of large intestine without perforation or abscess without bleeding: Secondary | ICD-10-CM

## 2024-03-03 MED ORDER — SODIUM CHLORIDE 0.9 % IV SOLN: 500.0000 mL | INTRAVENOUS | Status: DC

## 2024-03-03 NOTE — Discharge Instructions (Signed)

## 2024-03-03 NOTE — Op Note (Signed)
 [facility] Patient Name: Renee Santos Procedure Date: 03/03/2024 9:08 AM MRN: [id number] Endoscopist: Lupita FORBES Commander , MD, [id number] Age: 79 Referring MD:  Date of Birth: [birth year] Gender: Female Account #: [id number] Procedure:                Colonoscopy Indications:              Screening for colorectal malignant neoplasm, Last                            colonoscopy: 2015 Medicines:                Monitored Anesthesia Care Procedure:                Pre-Anesthesia Assessment:                           - Prior to the procedure, a History and Physical                            was performed, and patient medications and                            allergies were reviewed. The patient's tolerance of                            previous anesthesia was also reviewed. The risks                            and benefits of the procedure and the sedation                            options and risks were discussed with the patient.                            All questions were answered, and informed consent                            was obtained. Prior Anticoagulants: The patient has                            taken no anticoagulant or antiplatelet agents. ASA                            Grade Assessment: II - A patient with mild systemic                            disease. After reviewing the risks and benefits,                            the patient was deemed in satisfactory condition to                            undergo the procedure.  After obtaining informed consent, the colonoscope                            was passed under direct vision. Throughout the                            procedure, the patient's blood pressure, pulse, and                            oxygen saturations were monitored continuously. The                            Olympus Scope SN: [id number] was introduced through                            the anus and advanced to the the cecum,  identified                            by appendiceal orifice and ileocecal valve. The                            colonoscopy was somewhat difficult due to a                            redundant colon and significant looping. Successful                            completion of the procedure was aided by                            straightening and shortening the scope to obtain                            bowel loop reduction and using scope torsion. The                            patient tolerated the procedure well. The quality                            of the bowel preparation was adequate. The                            ileocecal valve, appendiceal orifice, and rectum                            were photographed. The bowel preparation used was                            SUPREP via split dose instruction. Scope In: 9:35:00 AM Scope Out: 9:53:13 AM Scope Withdrawal Time: 0 hours 14 minutes 4 seconds  Total Procedure Duration: 0 hours 18 minutes 13 seconds  Findings:                 The perianal and digital rectal examinations  were                            normal.                           Multiple small-mouthed diverticula were found in                            the sigmoid colon.                           The exam was otherwise without abnormality on                            direct and retroflexion views. Complications:            No immediate complications. Estimated Blood Loss:     Estimated blood loss: none. Impression:               - Diverticulosis in the sigmoid colon.                           - The examination was otherwise normal on direct                            and retroflexion views.                           - No specimens collected. Recommendation:           - Patient has a contact number available for                            emergencies. The signs and symptoms of potential                            delayed complications were discussed with the                             patient. Return to normal activities tomorrow.                            Written discharge instructions were provided to the                            patient.                           - Resume previous diet.                           - Continue present medications.                           - No repeat colonoscopy due to age and the absence  of colonic polyps. Lupita FORBES Commander, MD 03/03/2024 10:00:17 AM This report has been signed electronically.

## 2024-03-03 NOTE — Patient Instructions (Addendum)
 Please read handouts provided. Continue present medications. Resume previous diet.   YOU HAD AN ENDOSCOPIC PROCEDURE TODAY AT THE [facility]:   Refer to the procedure report that was given to you for any specific questions about what was found during the examination.  If the procedure report does not answer your questions, please call your gastroenterologist to clarify.  If you requested that your care partner not be given the details of your procedure findings, then the procedure report has been included in a sealed envelope for you to review at your convenience later.  YOU SHOULD EXPECT: Some feelings of bloating in the abdomen. Passage of more gas than usual.  Walking can help get rid of the air that was put into your GI tract during the procedure and reduce the bloating. If you had a lower endoscopy (such as a colonoscopy or flexible sigmoidoscopy) you may notice spotting of blood in your stool or on the toilet paper. If you underwent a bowel prep for your procedure, you may not have a normal bowel movement for a few days.  Please Note:  You might notice some irritation and congestion in your nose or some drainage.  This is from the oxygen used during your procedure.  There is no need for concern and it should clear up in a day or so.  SYMPTOMS TO REPORT IMMEDIATELY:  Following lower endoscopy (colonoscopy or flexible sigmoidoscopy):  Excessive amounts of blood in the stool  Significant tenderness or worsening of abdominal pains  Swelling of the abdomen that is new, acute  Fever of 100F or higher.  For urgent or emergent issues, a gastroenterologist can be reached at any hour by calling [phone]. Do not use MyChart messaging for urgent concerns.    DIET:  We do recommend a small meal at first, but then you may proceed to your regular diet.  Drink plenty of fluids but you should avoid alcoholic beverages for 24 hours.  ACTIVITY:  You should plan to take it easy for  the rest of today and you should NOT DRIVE or use heavy machinery until tomorrow (because of the sedation medicines used during the test).    FOLLOW UP: Our staff will call the number listed on your records the next business day following your procedure.  We will call around 7:15- 8:00 am to check on you and address any questions or concerns that you may have regarding the information given to you following your procedure. If we do not reach you, we will leave a message.     If any biopsies were taken you will be contacted by phone or by letter within the next 1-3 weeks.  Please call us  at [phone] if you have not heard about the biopsies in 3 weeks.    SIGNATURES/CONFIDENTIALITY: You and/or your care partner have signed paperwork which will be entered into your electronic medical record.  These signatures attest to the fact that that the information above on your After Visit Summary has been reviewed and is understood.  Full responsibility of the confidentiality of this discharge information lies with you and/or your care-partner.No polyps or cancer were seen.  You do have some diverticulosis as we saw in the past.  You do not need another routine repeat colonoscopy or other colon cancer screening tests.  We would investigate problems if needed.  I appreciate the opportunity to care for you. Lupita CHARLENA Commander, MD, [facility]

## 2024-03-03 NOTE — Progress Notes (Signed)
Updated medical record with pt

## 2024-03-03 NOTE — Progress Notes (Signed)
 Transferred to PACU via stretcher.  Not responding to stimulation at this time.  VSS upon leaving procedure room.

## 2024-03-03 NOTE — Progress Notes (Signed)
 History and Physical Interval Note:  03/03/2024 9:23 AM  Renee Santos  has presented today for endoscopic procedure(s), with the diagnosis of  Encounter Diagnosis  Name Primary?   Colon cancer screening Yes  .  The various methods of evaluation and treatment have been discussed with the patient and/or family. After consideration of risks, benefits and other options for treatment, the patient has consented to  the endoscopic procedure(s).   The patient's history has been reviewed, patient examined, no change in status, stable for endoscopic procedure(s).  I have reviewed the patient's chart and labs.  Questions were answered to the patient's satisfaction.     Lupita CHARLENA Commander, MD, [facility]

## 2024-03-04 ENCOUNTER — Other Ambulatory Visit (HOSPITAL_COMMUNITY)
Admission: RE | Admit: 2024-03-04 | Discharge: 2024-03-04 | Disposition: A | Source: Ambulatory Visit | Attending: Physician Assistant | Admitting: Physician Assistant

## 2024-03-04 ENCOUNTER — Ambulatory Visit
Admission: RE | Admit: 2024-03-04 | Discharge: 2024-03-04 | Disposition: A | Discharge status: Home / Self Care | Source: Ambulatory Visit | Attending: Physician Assistant | Admitting: Physician Assistant

## 2024-03-04 ENCOUNTER — Telehealth: Payer: Self-pay

## 2024-03-04 VITALS — BP 179/78 | HR 64 | Resp 19

## 2024-03-04 DIAGNOSIS — R413 Other amnesia: Secondary | ICD-10-CM | POA: Diagnosis present

## 2024-03-04 NOTE — Telephone Encounter (Signed)
" °  Follow up Call-     03/03/2024    8:37 AM  Call back number  Post procedure Call Back phone  # [phone]  Permission to leave phone message Yes     Patient questions:  Do you have a fever, pain , or abdominal swelling? No. Pain Score  0 *  Have you tolerated food without any problems? Yes.    Have you been able to return to your normal activities? Yes.    Do you have any questions about your discharge instructions: Diet   No. Medications  No. Follow up visit  No.  Do you have questions or concerns about your Care? No.  Actions: * If pain score is 4 or above: No action needed, pain <4.   "

## 2024-03-05 LAB — CYTOLOGY - NON PAP

## 2024-03-09 NOTE — Progress Notes (Unsigned)
 " Cardiology Office Note:    Date:  03/10/2024   ID:  Renee Santos, DOB [birth year], MRN [id number]  PCP:  Geofm Glade PARAS, MD   [facility] Providers Cardiologist:  Ozell Fell, MD     Referring MD: Geofm Glade PARAS, MD   Chief Complaint  Patient presents with   Mitral Valve Prolapse    History of Present Illness:    Renee Santos is a 79 y.o. female referred for evaluation of mitral regurgitation.  She was told of having mitral valve prolapse several years ago.  There is an old echo in the system from 2014 that reported mitral valve prolapse with moderate to severe mitral regurgitation.  She has never had symptoms of mitral valve prolapse.  She used to take SBE prophylaxis with dental work but does not do this anymore.  The patient had an echo in 11/2023 showing normal LVEF 60-65%, normal RV function, moderate LA dilatation, and moderate MR associated with late systolic prolapse and severe mitral annular calcium.  The patient is here with her husband today.  She is doing well from a cardiac perspective.  She denies any symptoms of chest pain, chest pressure, shortness of breath, or palpitations, lightheadedness, or syncope.  She walks up to 6 or 7 miles per day.  She has no exertional symptoms.  She has started having some short-term memory problems and started Aricept  about 2 weeks ago.  She has not noticed any substantial side effects from this medication today.  She states that they are planning on doubling her medicine in the near future.  Past Medical History:  Diagnosis Date   Anxiety    Cancer [facility]) 2011   Basal Cell of nose; Dr Jordan   Cataract    Herpes zoster 2008   posterior thorax   Hypertension    Memory deficit    Neurofibroma of back 2013    Dr Amy Jordan    Osteopenia 2016   T score -1.6 FRAX not calculated because of HRT   Osteoporosis    Psoriasis    Dr Amy Jordan   Thyroid  nodule    Past Surgical History:  Procedure Laterality Date    ABDOMINAL HYSTERECTOMY      LSO  & appendectomy for endometrioma   @ age38 for ovarian cyst   APPENDECTOMY     cataract Bilateral Dec 2016;   not sure of date; wears readers only    COLONOSCOPY  multiple   OOPHORECTOMY     LSO for cyst   TONSILLECTOMY     Current Medications: Active Medications[1]   Allergies:   Patient has no known allergies.   ROS:   Please see the history of present illness.    All other systems reviewed and are negative.  EKGs/Labs/Other Studies Reviewed:    The following studies were reviewed today: Cardiac Studies & Procedures   ______________________________________________________________________________________________     ECHOCARDIOGRAM  ECHOCARDIOGRAM COMPLETE 11/25/2023  Narrative ECHOCARDIOGRAM REPORT    Patient Name:   Renee Santos Date of Exam: 11/25/2023 Medical Rec #:  [id number]     Height:       61.5 in Accession #:    [id number]    Weight:       98.0 lb Date of Birth:  [birth year]     BSA:          1.403 m Patient Age:    78 years      BP:  110/60 mmHg Patient Gender: F             HR:           61 bpm. Exam Location:  Outpatient  Procedure: 2D Echo, 3D Echo, Color Doppler, Cardiac Doppler and Strain Analysis (Both Spectral and Color Flow Doppler were utilized during procedure).  Indications:    Mitral Valve Disorder  History:        Patient has prior history of Echocardiogram examinations, most recent 03/12/2012. Risk Factors:Hypertension, Former Smoker and Dyslipidemia. MVP.  Sonographer:    Orvil Holmes RDCS Referring Phys: [id number] STACY J BURNS  IMPRESSIONS   1. Left ventricular ejection fraction, by estimation, is 60 to 65%. Left ventricular ejection fraction by 3D volume is 62 %. The left ventricle has normal function. The left ventricle has no regional wall motion abnormalities. Left ventricular diastolic parameters are consistent with Grade I diastolic dysfunction (impaired relaxation). The average  left ventricular global longitudinal strain is -13.4 %. The global longitudinal strain is abnormal. 2. Right ventricular systolic function is normal. The right ventricular size is normal. There is normal pulmonary artery systolic pressure. The estimated right ventricular systolic pressure is 17.6 mmHg. 3. Left atrial size was moderately dilated. 4. The mitral valve is myxomatous. Moderate mitral valve regurgitation. There is moderate late systolic prolapse of both leaflets of the mitral valve. Moderate to severe mitral annular calcification. 5. The aortic valve is tricuspid. Aortic valve regurgitation is not visualized. 6. The inferior vena cava is normal in size with greater than 50% respiratory variability, suggesting right atrial pressure of 3 mmHg.  FINDINGS Left Ventricle: Left ventricular ejection fraction, by estimation, is 60 to 65%. Left ventricular ejection fraction by 3D volume is 62 %. The left ventricle has normal function. The left ventricle has no regional wall motion abnormalities. The average left ventricular global longitudinal strain is -13.4 %. Strain was performed and the global longitudinal strain is abnormal. The left ventricular internal cavity size was normal in size. There is no left ventricular hypertrophy. Left ventricular diastolic parameters are consistent with Grade I diastolic dysfunction (impaired relaxation). Indeterminate filling pressures.  Right Ventricle: The right ventricular size is normal. No increase in right ventricular wall thickness. Right ventricular systolic function is normal. There is normal pulmonary artery systolic pressure. The tricuspid regurgitant velocity is 1.91 m/s, and with an assumed right atrial pressure of 3 mmHg, the estimated right ventricular systolic pressure is 17.6 mmHg.  Left Atrium: Left atrial size was moderately dilated.  Right Atrium: Right atrial size was normal in size.  Pericardium: There is no evidence of pericardial  effusion.  Mitral Valve: The mitral valve is myxomatous. There is moderate late systolic prolapse of both leaflets of the mitral valve. Moderate to severe mitral annular calcification. Moderate mitral valve regurgitation, with centrally-directed jet.  Tricuspid Valve: The tricuspid valve is grossly normal. Tricuspid valve regurgitation is mild.  Aortic Valve: The aortic valve is tricuspid. Aortic valve regurgitation is not visualized.  Pulmonic Valve: The pulmonic valve was grossly normal. Pulmonic valve regurgitation is trivial.  Aorta: The aortic root and ascending aorta are structurally normal, with no evidence of dilitation.  Venous: The inferior vena cava is normal in size with greater than 50% respiratory variability, suggesting right atrial pressure of 3 mmHg.  IAS/Shunts: No atrial level shunt detected by color flow Doppler.  Additional Comments: 3D was performed not requiring image post processing on an independent workstation and was normal.   LEFT VENTRICLE PLAX 2D LVIDd:  4.11 cm         Diastology LVIDs:         2.43 cm         LV e' medial:    3.48 cm/s LV PW:         1.05 cm         LV E/e' medial:  20.3 LV IVS:        0.72 cm         LV e' lateral:   9.46 cm/s LVOT diam:     1.90 cm         LV E/e' lateral: 7.5 LV SV:         43 LV SV Index:   30              2D Longitudinal LVOT Area:     2.84 cm        Strain 2D Strain GLS   -14.3 % (A4C): 2D Strain GLS   -12.1 % (A3C): 2D Strain GLS   -14.0 % (A2C): 2D Strain GLS   -13.4 % Avg:  3D Volume EF LV 3D EF:    Left ventricul ar ejection fraction by 3D volume is 62 %.  3D Volume EF: 3D EF:        62 % LV EDV:       79 ml LV ESV:       30 ml LV SV:        49 ml  RIGHT VENTRICLE RV Basal diam:  2.98 cm     PULMONARY VEINS RV Mid diam:    2.33 cm     A Reversal Velocity: 31.20 cm/s RV S prime:     16.40 cm/s  Diastolic Velocity:  72.20 cm/s TAPSE (M-mode): 2.3 cm      S/D Velocity:         0.80 Systolic Velocity:   58.60 cm/s  LEFT ATRIUM             Index        RIGHT ATRIUM           Index LA diam:        3.80 cm 2.71 cm/m   RA Area:     10.90 cm LA Vol (A2C):   73.1 ml 52.09 ml/m  RA Volume:   25.10 ml  17.89 ml/m LA Vol (A4C):   38.5 ml 27.44 ml/m LA Biplane Vol: 54.7 ml 38.98 ml/m AORTIC VALVE LVOT Vmax:   74.40 cm/s LVOT Vmean:  45.100 cm/s LVOT VTI:    0.150 m  AORTA Ao Root diam: 2.80 cm Ao Asc diam:  2.90 cm  MITRAL VALVE                  TRICUSPID VALVE MV Area (PHT): 2.66 cm       TR Peak grad:   14.6 mmHg MV Decel Time: 285 msec       TR Vmax:        191.00 cm/s MR Peak grad:    156.8 mmHg MR Mean grad:    108.0 mmHg   SHUNTS MR Vmax:         626.00 cm/s  Systemic VTI:  0.15 m MR Vmean:        492.0 cm/s   Systemic Diam: 1.90 cm MR PISA:         2.26 cm MR PISA Eff ROA: 11 mm MR PISA Radius:  0.60 cm MV E velocity: 70.70  cm/s MV A velocity: 63.40 cm/s MV E/A ratio:  1.12  Vinie Maxcy MD Electronically signed by Vinie Maxcy MD Signature Date/Time: 11/25/2023/5:34:17 PM    Final      CT SCANS  CT CARDIAC SCORING (SELF PAY ONLY) 09/10/2022  Addendum 09/17/2022  3:00 AM ADDENDUM REPORT: 09/17/2022 02:57  EXAM: OVER-READ INTERPRETATION  CT CHEST  The following report is an over-read performed by radiologist Dr. Suzen Dials of [facility] on 09/17/2022. This over-read does not include interpretation of cardiac or coronary anatomy or pathology. The coronary calcium score/coronary CTA interpretation by the cardiologist is attached.  COMPARISON:  March 10, 2012  FINDINGS: Cardiovascular: There are no significant extracardiac vascular findings.  Mediastinum/Nodes: There are no enlarged lymph nodes within the visualized mediastinum.  Lungs/Pleura: There is no pleural effusion. 4 mm noncalcified lung nodules are seen within the right lower lobe (axial CT images 11 and 27, CT series 2). A 4 mm noncalcified  lung nodule versus focal scar seen within the posterior aspect of the right upper lobe (axial CT image 5, CT series 2).  Upper abdomen: No significant findings in the visualized upper abdomen.  Musculoskeletal/Chest wall: No chest wall mass or suspicious osseous findings within the visualized chest.  IMPRESSION: Multiple 4 mm noncalcified lung nodules within the right lung. No follow-up needed if patient is low-risk (and has no known or suspected primary neoplasm). Non-contrast chest CT can be considered in 12 months if patient is high-risk. This recommendation follows the consensus statement: Guidelines for Management of Incidental Pulmonary Nodules Detected on CT Images: From the Fleischner Society 2017; Radiology 2017; 284:228-243.   Electronically Signed By: Suzen Dials M.D. On: 09/17/2022 02:57  Narrative CLINICAL DATA:  Cardiovascular Disease Risk stratification  EXAM: Coronary Calcium Score  TECHNIQUE: A gated, non-contrast computed tomography scan of the heart was performed using 3mm slice thickness. Axial images were analyzed on a dedicated workstation. Calcium scoring of the coronary arteries was performed using the Agatston method.  FINDINGS: Coronary arteries: Normal origins.  Coronary Calcium Score:  Total: 0  Pericardium: Normal.  Ascending Aorta: Normal caliber.  Mild aortic atherosclerosis.  Moderate mitral annular calcification  Non-cardiac: See separate report from [facility].  IMPRESSION: 1. Coronary calcium score of 0. This was <1 percentile for age-, race-, and sex-matched controls.  2. Moderate mitral annular calcification.  3. Mild aortic atherosclerosis.  RECOMMENDATIONS: Coronary artery calcium (CAC) score is a strong predictor of incident coronary heart disease (CHD) and provides predictive information beyond traditional risk factors. CAC scoring is reasonable to use in the decision to withhold, postpone, or  initiate statin therapy in intermediate-risk or selected borderline-risk asymptomatic adults (age 77-75 years and LDL-C >=70 to <190 mg/dL) who do not have diabetes or established atherosclerotic cardiovascular disease (ASCVD).* In intermediate-risk (10-year ASCVD risk >=7.5% to <20%) adults or selected borderline-risk (10-year ASCVD risk >=5% to <7.5%) adults in whom a CAC score is measured for the purpose of making a treatment decision the following recommendations have been made:  If CAC=0, it is reasonable to withhold statin therapy and reassess in 5 to 10 years, as long as higher risk conditions are absent (diabetes mellitus, family history of premature CHD in first degree relatives (males <55 years; females <65 years), cigarette smoking, or LDL >=190 mg/dL).  If CAC is 1 to 99, it is reasonable to initiate statin therapy for patients >=64 years of age.  If CAC is >=100 or >=75th percentile, it is reasonable to initiate statin therapy  at any age.  Cardiology referral should be considered for patients with CAC scores >=400 or >=75th percentile.  *2018 AHA/ACC/AACVPR/AAPA/ABC/ACPM/ADA/AGS/APhA/ASPC/NLA/PCNA Guideline on the Management of Blood Cholesterol: A Report of the American College of Cardiology/American Heart Association Task Force on Clinical Practice Guidelines. J Am Coll Cardiol. 2019;73(24):3168-3209.  Electronically Signed: By: Oneil Parchment M.D. On: 09/10/2022 15:25     ______________________________________________________________________________________________      EKG:   EKG Interpretation Date/Time:  Wednesday March 10 2024 10:58:40 EST Ventricular Rate:  60 PR Interval:  150 QRS Duration:  88 QT Interval:  408 QTC Calculation: 408 R Axis:   68  Text Interpretation: Normal sinus rhythm Normal ECG When compared with ECG of 13-Sep-2020 14:52, No significant change was found Confirmed by Wonda Sharper [phone]) on 03/10/2024 11:09:58 AM    Recent  Labs: 10/27/2023: ALT 15; BUN 19; Creatinine, Ser 0.97; Hemoglobin 13.5; Platelets 217.0; Potassium 3.7; Sodium 138; TSH 1.30  Recent Lipid Panel    Component Value Date/Time   CHOL 205 (H) 10/27/2023 1538   TRIG 84.0 10/27/2023 1538   HDL 96.70 10/27/2023 1538   CHOLHDL 2 10/27/2023 1538   VLDL 16.8 10/27/2023 1538   LDLCALC 91 10/27/2023 1538   LDLDIRECT 96.0 11/06/2010 1209     Risk Assessment/Calculations:                Physical Exam:    VS:  BP 120/80 (BP Location: Right Arm, Patient Position: Sitting, Cuff Size: Normal)   Pulse 60   Ht 5' 2 (1.575 m)   Wt 98 lb 9.6 oz (44.7 kg)   SpO2 97%   BMI 18.03 kg/m     Wt Readings from Last 3 Encounters:  03/10/24 98 lb 9.6 oz (44.7 kg)  03/03/24 96 lb (43.5 kg)  02/19/24 97 lb 6.4 oz (44.2 kg)     GEN: Very pleasant, thin woman in no acute distress HEENT: Normal NECK: No JVD; No carotid bruits LYMPHATICS: No lymphadenopathy CARDIAC: RRR, 2/6 late systolic murmur at the apex RESPIRATORY:  Clear to auscultation without rales, wheezing or rhonchi  ABDOMEN: Soft, non-tender, non-distended MUSCULOSKELETAL:  No edema; No deformity  SKIN: Warm and dry NEUROLOGIC:  Alert and oriented x 3 PSYCHIATRIC:  Normal affect   Assessment & Plan Nonrheumatic mitral valve regurgitation The patient has mitral valve prolapse with moderate mitral regurgitation.  Her exam is classic for mitral valve prolapse with MR.  She is completely asymptomatic.  We discussed recommendations for surveillance both clinically and with echo.  I would like to see her back in 1 year with a repeat echocardiogram in 2 years.  There is no indication for SBE prophylaxis.  She will continue on a beta-blocker.            Medication Adjustments/Labs and Tests Ordered: Current medicines are reviewed at length with the patient today.  Concerns regarding medicines are outlined above.  Orders Placed This Encounter  Procedures   EKG 12-Lead   No orders of  the defined types were placed in this encounter.   Patient Instructions  Medication Instructions:  No medication changes were made at this visit. Continue current regimen.   *If you need a refill on your cardiac medications before your next appointment, please call your pharmacy*  Lab Work: None ordered today. If you have labs (blood work) drawn today and your tests are completely normal, you will receive your results only by: MyChart Message (if you have MyChart) OR A paper copy in the mail If  you have any lab test that is abnormal or we need to change your treatment, we will call you to review the results.  Testing/Procedures: None ordered today.  Follow-Up: At [facility], you and your health needs are our priority.  As part of our continuing mission to provide you with exceptional heart care, our providers are all part of one team.  This team includes your primary Cardiologist (physician) and Advanced Practice Providers or APPs (Physician Assistants and Nurse Practitioners) who all work together to provide you with the care you need, when you need it.  Your next appointment:   1 year(s)  Provider:   Ozell Fell, MD     Signed, Ozell Fell, MD  03/10/2024 1:50 PM    [facility]     [1]  Current Meds  Medication Sig   alendronate  (FOSAMAX ) 70 MG tablet Take 1 tablet (70 mg total) by mouth every 7 (seven) days. Take with a full glass of water on an empty stomach.   BIOTIN FORTE PO Take 1 tablet by mouth.   Calcium Carbonate-Vit D-Min (CALTRATE PLUS PO) Take by mouth daily.     clobetasol  cream (TEMOVATE ) 0.05 % Apply 1 application topically 2 (two) times daily.   Cyanocobalamin  (VITAMIN B-12 PO) Take by mouth.   donepezil  (ARICEPT ) 10 MG tablet Take half tablet (5 mg) daily for 2 weeks, then increase to the full tablet at 10 mg daily   levothyroxine  (SYNTHROID ) 75 MCG tablet Take 1 tablet by mouth 6 days a week   LORazepam  (ATIVAN ) 0.5 MG tablet  Take 1 tablet by mouth twice daily as needed for anxiety   metoprolol  succinate (TOPROL -XL) 50 MG 24 hr tablet Take 1/2 tablet by mouth every day   multivitamin-lutein (OCUVITE-LUTEIN) CAPS capsule Take 1 capsule by mouth daily.   Na Sulfate-K Sulfate-Mg Sulfate concentrate (SUPREP) 17.5-3.13-1.6 GM/177ML SOLN Take 1 kit (354 mLs total) by mouth as directed.   traZODone  (DESYREL ) 50 MG tablet TAKE 1 TABLET BY MOUTH AT BEDTIME (Patient taking differently: Take 50 mg by mouth as needed.)   triamcinolone  cream (KENALOG) 0.1 % Apply topically.   valACYclovir (VALTREX) 1000 MG tablet Take 1,000 mg by mouth daily. (Patient taking differently: Take 1,000 mg by mouth as needed.)   Vibegron  (GEMTESA ) 75 MG TABS Take 1 tablet (75 mg total) by mouth daily.   "

## 2024-03-10 ENCOUNTER — Ambulatory Visit: Admitting: Cardiovascular Disease

## 2024-03-10 ENCOUNTER — Encounter: Payer: Self-pay | Admitting: Cardiovascular Disease

## 2024-03-10 VITALS — BP 120/80 | HR 60 | Ht 62.0 in | Wt 98.6 lb

## 2024-03-10 DIAGNOSIS — I340 Nonrheumatic mitral (valve) insufficiency: Secondary | ICD-10-CM | POA: Diagnosis not present

## 2024-03-10 LAB — FUNGUS CULTURE W SMEAR
MICRO NUMBER:: 17502359
SMEAR:: NONE SEEN
SPECIMEN QUALITY:: ADEQUATE

## 2024-03-10 LAB — CSF CULTURE W GRAM STAIN
MICRO NUMBER:: 17502360
Result:: NO GROWTH
SPECIMEN QUALITY:: ADEQUATE

## 2024-03-10 LAB — CSF CELL COUNT WITH DIFFERENTIAL
RBC Count, CSF: 14 {cells}/uL — ABNORMAL HIGH
TOTAL NUCLEATED CELL: 1 {cells}/uL (ref 0–5)

## 2024-03-10 LAB — CRYPTOCOCCAL AG, LTX SCR RFLX TITER
Cryptococcal Ag Screen: NOT DETECTED
MICRO NUMBER:: 17502358
SPECIMEN QUALITY:: ADEQUATE

## 2024-03-10 LAB — "HERPES SIMPLEX VIRUS, TYPE 1 AND 2 DNA,QUAL,RT PCR  "
HSV 1 DNA: NOT DETECTED
HSV 2 DNA: NOT DETECTED

## 2024-03-10 LAB — CNS IGG SYNTHESIS RATE, CSF+BLOOD
Albumin Serum: 4.3 g/dL (ref 3.6–5.1)
Albumin, CSF: 19.7 mg/dL (ref 8.0–42.0)
CNS-IgG Synthesis Rate: -3 mg/(24.h) (ref <=3.3)
IgG (Immunoglobin G), Serum: 959 mg/dL (ref 600–1540)
IgG Total CSF: 2.1 mg/dL (ref 0.8–7.7)
IgG-Index: 0.48

## 2024-03-10 LAB — OLIGOCLONAL BANDS, CSF + SERM: Oligo Bands: ABSENT

## 2024-03-10 LAB — PROTEIN, CSF: Total Protein, CSF: 39 mg/dL (ref 15–60)

## 2024-03-10 LAB — GLUCOSE, CSF: Glucose, CSF: 51 mg/dL (ref 40–80)

## 2024-03-10 IMAGING — CT CT NECK W/ CM
4 of 5 series · 14 of 33 positions shown, 16 images · IV contrast (APPLIED)
Comparison: Head CT 07/25/2016.

CLINICAL DATA: 76-year-old female with soft tissue swelling,
redness, tenderness left posterior ear. History of cellulitis there.
Query mastoiditis. Neck pain.

EXAM:
CT NECK WITH CONTRAST
TECHNIQUE: Multidetector CT imaging of the neck was performed using the
standard protocol following the bolus administration of intravenous
contrast.

[Series 3: ax bone · axial · 0.43mm/px · z∈[+959,+1055]mm · 3 of 98 slices shown, 4 images]
[im 25/98  soft-tissue]
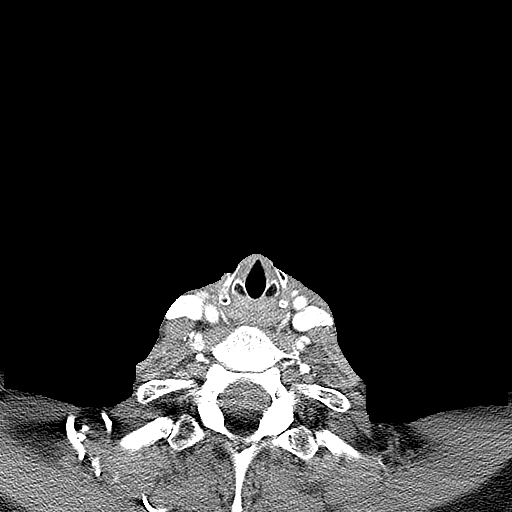
[im 25/98  bone]
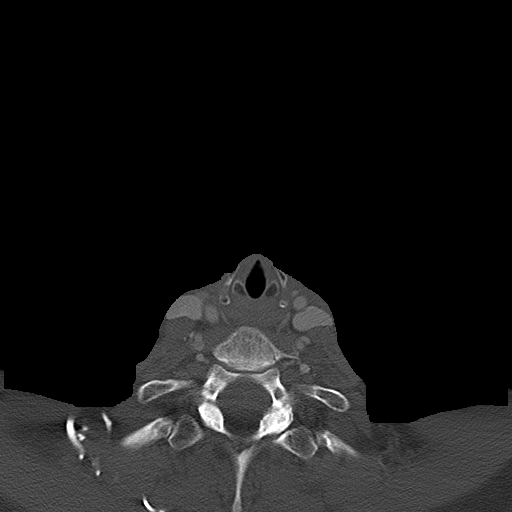
[im 49/98  bone]
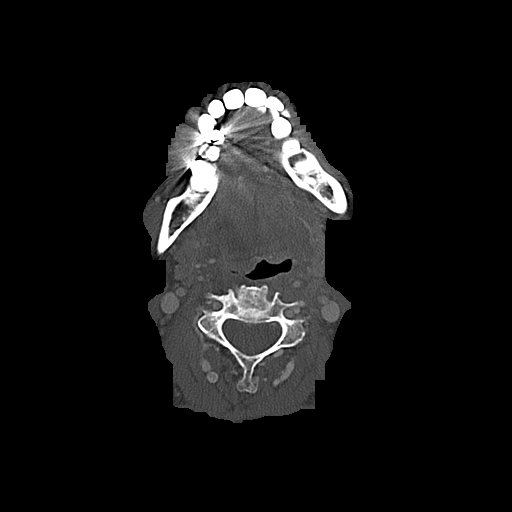
[im 73/98  bone]
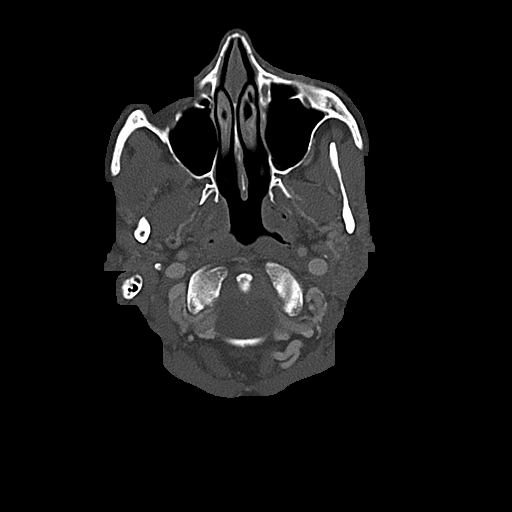

[Series 4: cor neck · coronal · 0.32mm/px · 3 of 122 slices shown]
[im 25/122  bone]
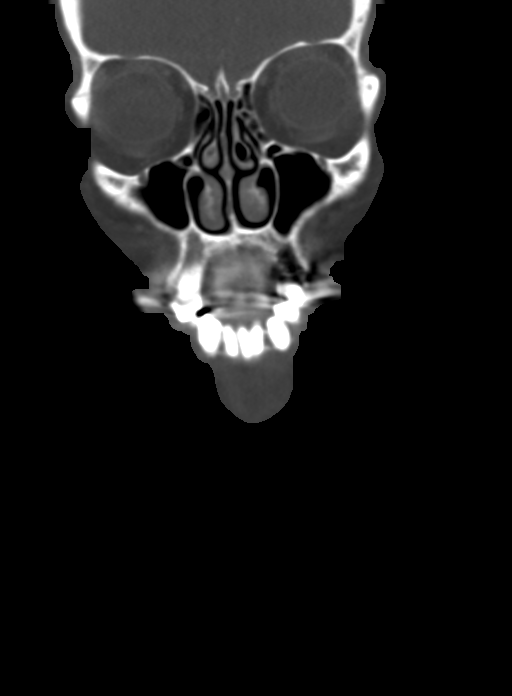
[im 49/122  bone]
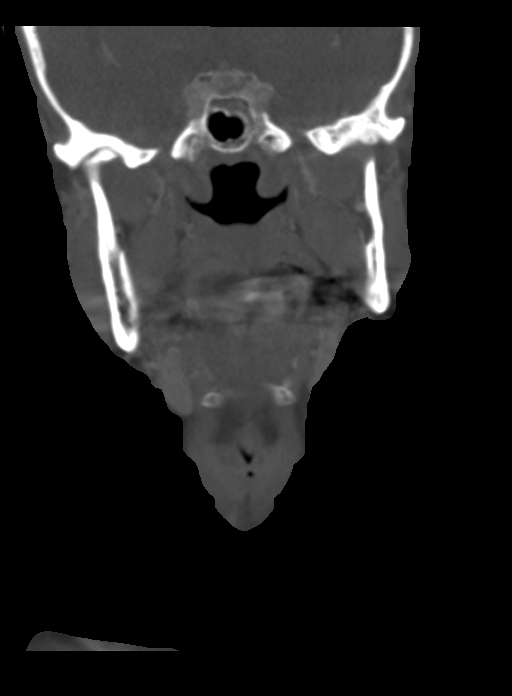
[im 73/122  bone]
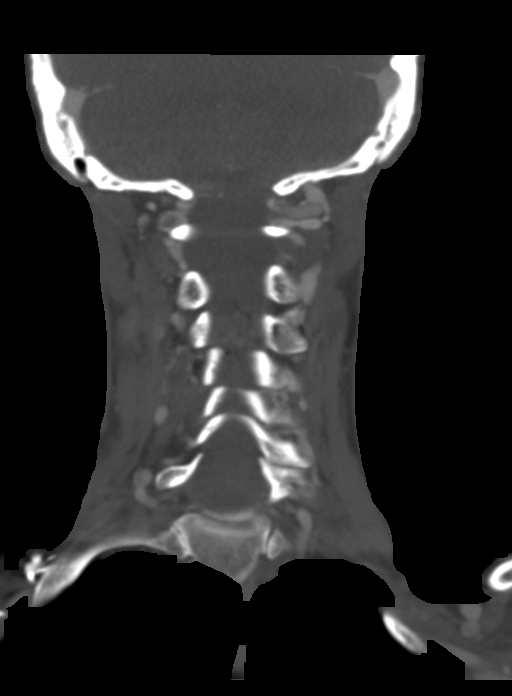

[Series 5: sag neck · sagittal · 0.43mm/px · 5 of 97 slices shown, 6 images]
[im 25/97  bone]
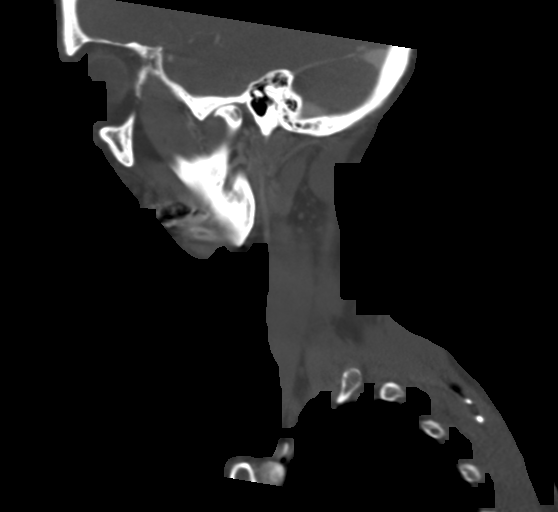
[im 33/97  bone]
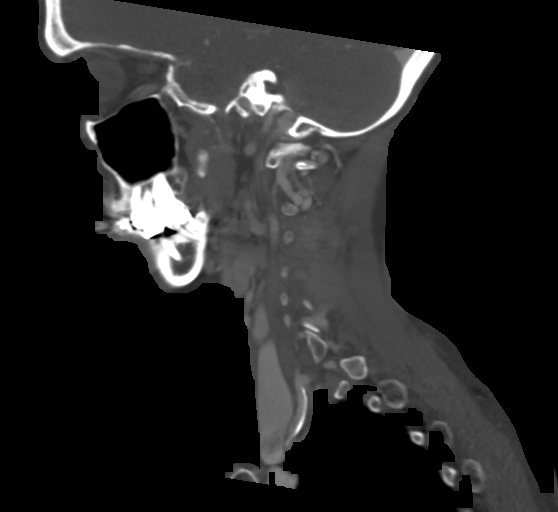
[im 41/97  bone]
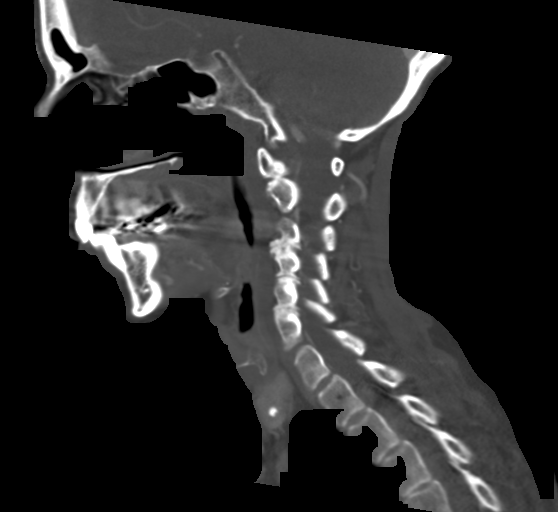
[im 49/97  soft-tissue]
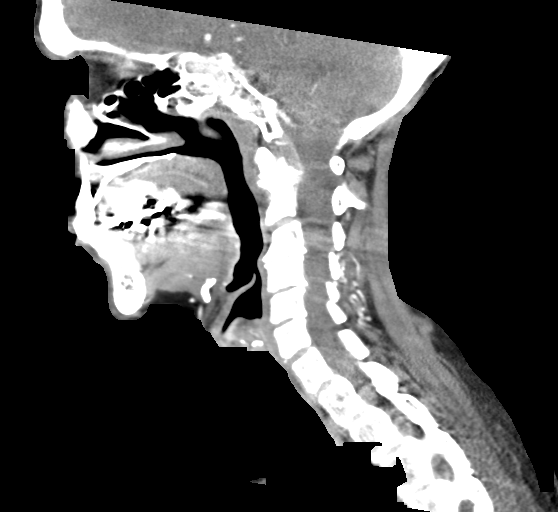
[im 49/97  bone]
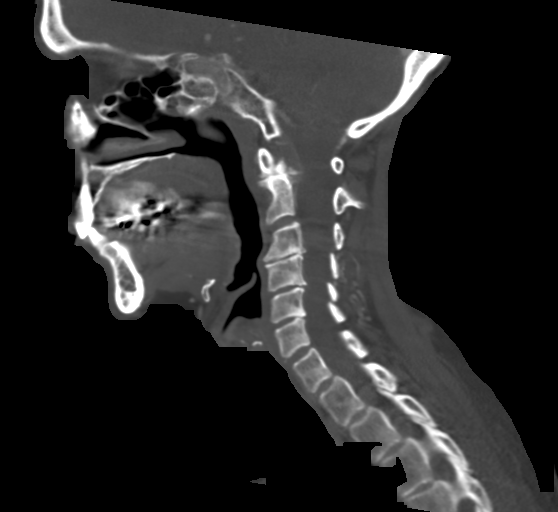
[im 57/97  bone]
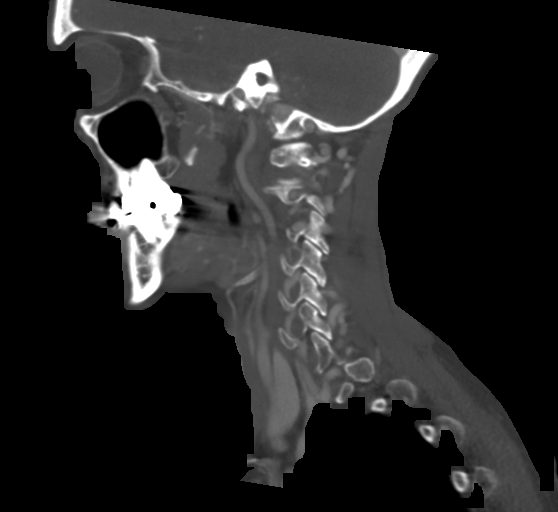

[Series 6: ax oropharynx · axial · 0.39mm/px · z∈[+934,+1039]mm · 3 of 107 slices shown]
[im 27/107  bone]
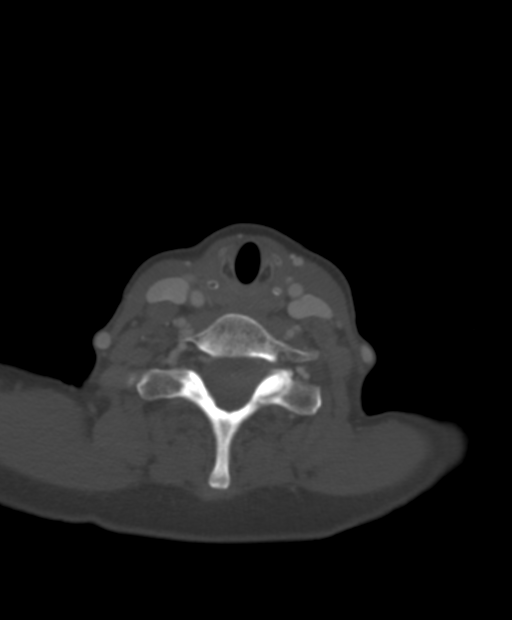
[im 54/107  bone]
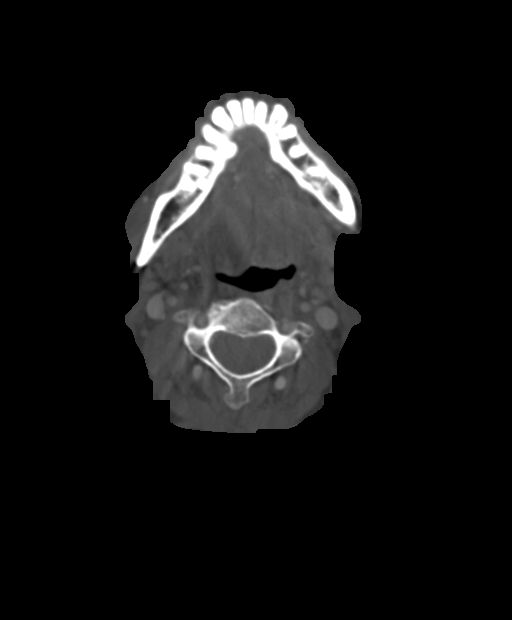
[im 80/107  bone]
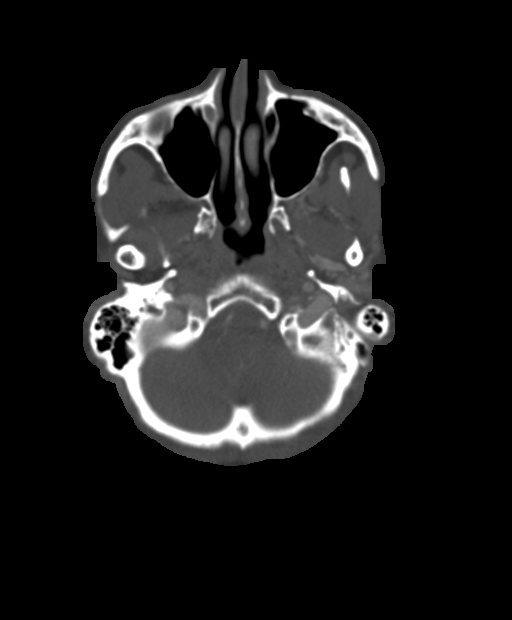

[14 of 33 positions shown; findings below may reference images not displayed]

RADIATION DOSE REDUCTION: This exam was performed according to the
departmental dose-optimization program which includes automated
exposure control, adjustment of the mA and/or kV according to
patient size and/or use of iterative reconstruction technique.

CONTRAST:  80mL OMNIPAQUE IOHEXOL 300 MG/ML  SOLN
FINDINGS: Pharynx and larynx: Glottis is closed. Laryngeal soft tissue
contours are within normal limits.

Asymmetric hypertrophy of the lingual tonsil is greater on the right
and effaces the right vallecula on series 2, image 55. Palatine
tonsils appear to be surgically absent. Retained secretions versus
adhesion in the left oropharynx on series 2, image 41 near the soft
palate.

At the adenoids there is asymmetric soft tissue enlargement and
mucosal hyperenhancement right side (series 2, image 26).

Parapharyngeal and retropharyngeal spaces appear.

Salivary glands: Negative sublingual space. Submandibular glands are
within normal limits.

There is partial parotid gland atrophy which appears greater on the
left. No parotid mass or inflammation.

Thyroid: Negative.

Lymph nodes: Negative. No cervical lymphadenopathy. Symmetric and
fairly diminutive bilateral cervical lymph nodes. No cystic or
necrotic lymph node identified.

Vascular: Major vascular structures in the neck and at the skull
base are enhancing and patent. Codominant vertebral arteries. No
significant atherosclerosis in the neck.

Limited intracranial: Bilateral transverse and sigmoid dural sinuses
are enhancing patent. Negative visible brain parenchyma.

Visualized orbits: Postoperative changes to both globes, otherwise
negative.

Mastoids and visualized paranasal sinuses: Bilateral paranasal
sinuses are well aerated. There is only trace alveolar recess
mucosal thickening.

Bilateral middle ears and mastoids are clear. Aside from some debris
(greater on the right), the external auditory canals also appear
patent. Bilateral temporal bones appear intact.

No Misionera soft tissue swelling or other periauricular soft tissue
inflammation or abnormality identified.

Skeleton: Bilateral TMJ degeneration, fairly symmetric. No acute
dental finding identified. Cervical spine degeneration, which is
asymmetrically severe at the left C1-C2 articulation with complete
joint space loss there, bone-on-bone appearance with subchondral
sclerosis and osteophytosis (coronal image 59). No acute or
suspicious osseous lesion identified.

Upper chest: Negative.
IMPRESSION: 1. Negative for mastoiditis, neck mass, or lymphadenopathy. No left
periauricular inflammation. But the symptomatic abnormality might be
asymmetrically severe cervical spine degeneration at the left C1-C2
articulation.

2. However, there is conspicuous asymmetry of the right side
adenoids and the right lingual tonsil. This might be physiologic,
but recommend follow-up Endoscopy and direct inspection.

## 2024-03-10 NOTE — Patient Instructions (Signed)

## 2024-03-17 LAB — MAYO MISC ORDER 2: PRICE:: 1288

## 2024-03-18 ENCOUNTER — Ambulatory Visit: Payer: Self-pay | Admitting: Physician Assistant

## 2024-03-18 ENCOUNTER — Encounter: Payer: Self-pay | Admitting: Physician Assistant

## 2024-03-18 NOTE — Progress Notes (Signed)
 Spoke with pt about negative LP results, no Alzheimer's is noted on this test. All questions answered to her satisfaction, she was appreciative of the call.

## 2024-06-29 ENCOUNTER — Ambulatory Visit: Admitting: Nurse Practitioner

## 2024-08-10 ENCOUNTER — Ambulatory Visit: Payer: Self-pay | Admitting: Physician Assistant

## 2024-10-08 ENCOUNTER — Ambulatory Visit
